# Patient Record
Sex: Female | Born: 1990 | Race: Black or African American | Hispanic: No | Marital: Married | Smoking: Never smoker
Health system: Southern US, Community
[De-identification: ages and names within clinical notes are randomized; demographics above are authoritative.]

## PROBLEM LIST (undated history)

## (undated) DIAGNOSIS — J45909 Unspecified asthma, uncomplicated: Secondary | ICD-10-CM

## (undated) DIAGNOSIS — L309 Dermatitis, unspecified: Secondary | ICD-10-CM

## (undated) DIAGNOSIS — T7840XA Allergy, unspecified, initial encounter: Secondary | ICD-10-CM

## (undated) DIAGNOSIS — T783XXA Angioneurotic edema, initial encounter: Secondary | ICD-10-CM

## (undated) DIAGNOSIS — M359 Systemic involvement of connective tissue, unspecified: Secondary | ICD-10-CM

## (undated) DIAGNOSIS — L509 Urticaria, unspecified: Secondary | ICD-10-CM

## (undated) HISTORY — DX: Unspecified asthma, uncomplicated: J45.909

## (undated) HISTORY — DX: Dermatitis, unspecified: L30.9

## (undated) HISTORY — DX: Allergy, unspecified, initial encounter: T78.40XA

## (undated) HISTORY — DX: Angioneurotic edema, initial encounter: T78.3XXA

## (undated) HISTORY — DX: Urticaria, unspecified: L50.9

---
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
## 2016-10-08 DIAGNOSIS — G4700 Insomnia, unspecified: Secondary | ICD-10-CM | POA: Insufficient documentation

## 2016-10-08 DIAGNOSIS — K5904 Chronic idiopathic constipation: Secondary | ICD-10-CM | POA: Insufficient documentation

## 2020-01-05 ENCOUNTER — Other Ambulatory Visit: Payer: Self-pay

## 2020-01-05 ENCOUNTER — Ambulatory Visit (INDEPENDENT_AMBULATORY_CARE_PROVIDER_SITE_OTHER): Admitting: Allergy and Immunology

## 2020-01-05 ENCOUNTER — Encounter: Payer: Self-pay | Admitting: Allergy and Immunology

## 2020-01-05 VITALS — BP 122/76 | HR 80 | Temp 98.1°F | Resp 16 | Ht 64.84 in | Wt 183.1 lb

## 2020-01-05 DIAGNOSIS — T783XXA Angioneurotic edema, initial encounter: Secondary | ICD-10-CM

## 2020-01-05 DIAGNOSIS — L500 Allergic urticaria: Secondary | ICD-10-CM | POA: Insufficient documentation

## 2020-01-05 DIAGNOSIS — T783XXD Angioneurotic edema, subsequent encounter: Secondary | ICD-10-CM

## 2020-01-05 DIAGNOSIS — J3089 Other allergic rhinitis: Secondary | ICD-10-CM | POA: Insufficient documentation

## 2020-01-05 DIAGNOSIS — K2970 Gastritis, unspecified, without bleeding: Secondary | ICD-10-CM

## 2020-01-05 DIAGNOSIS — J4520 Mild intermittent asthma, uncomplicated: Secondary | ICD-10-CM | POA: Diagnosis not present

## 2020-01-05 HISTORY — DX: Angioneurotic edema, initial encounter: T78.3XXA

## 2020-01-05 HISTORY — DX: Allergic urticaria: L50.0

## 2020-01-05 MED ORDER — AZELASTINE HCL 0.1 % NA SOLN: 1.0000 | Freq: Two times a day (BID) | NASAL | 5 refills | Status: DC | PRN

## 2020-01-05 MED ORDER — LEVOCETIRIZINE DIHYDROCHLORIDE 5 MG PO TABS
5.0000 mg | ORAL_TABLET | Freq: Every day | ORAL | 5 refills | Status: DC | PRN
Start: 2020-01-05 — End: 2021-05-04

## 2020-01-05 MED ORDER — EPINEPHRINE 0.3 MG/0.3ML IJ SOAJ: 0.3000 mg | INTRAMUSCULAR | 2 refills | Status: DC | PRN

## 2020-01-05 MED ORDER — FAMOTIDINE 20 MG PO TABS: 20.0000 mg | ORAL_TABLET | Freq: Two times a day (BID) | ORAL | 5 refills | Status: DC

## 2020-01-05 NOTE — Assessment & Plan Note (Signed)
   Continue to have access to albuterol HFA, 1 to 2 inhalations every 4-6 hours if needed. 

## 2020-01-05 NOTE — Progress Notes (Signed)
New Patient Note  RE: Shannon Huerta MRN: [id number] DOB: 1991-07-07 Date of Office Visit: 01/05/2020  Referring provider: Gerald Huerta Primary care provider: Andria Frames, PA-C  Chief Complaint: Urticaria and Asthma   History of present illness: Shannon Huerta is a 29 y.o. female seen today in consultation requested by Shannon Meo, PA-C.  Over the past 22 years, Shannon Huerta has experienced recurrent episodes of hives. However she had no hives over the past 2 years until she gave birth to her daughter on April 25th, 2021 and the hives have been particularly frequent and severe since that time.  The hives have appeared at different times over her entire body.  The lesions are described as erythematous, raised, and pruritic.  Individual hives last less than 24 hours without leaving residual pigmentation or bruising. She has experienced associated angioedema of the lips and/or eyelids but denies concomitant cardiopulmonary or GI symptoms. Occasionally she will get mild tingling sensation of the tongues or throat "but nothing too crazy."  She has not experienced unexpected weight loss or recurrent fevers.  The hives have seemed to get worse when she is around cats.  Otherwise, no specific medication, food, skin care product, detergent, soap, or other environmental triggers have been identified. The symptoms do not seem to correlate with NSAIDs. The hive are more frequent/severe with emotional stress.  Shannon Huerta has tried to control symptoms with systemic steroids and OTC antihistamines which have offered adequate temporary relief.  Over the past few weeks she has required 20 mg prednisone daily to control the hives. She has been evaluated and treated in the emergency department for these symptoms. Skin biopsy has not been performed. Over the past month she has been experiencing hives daily. She experiences nasal congestion, rhinorrhea, sneezing, nasal pruritus, and ocular pruritus.  The symptoms  occur most frequently with pollen exposure. Shannon Huerta had childhood asthma which seem to have resolved.  However, during her 2 pregnancies she did experience chest tightness, dyspnea, and wheezing which was relieved by albuterol.  She reports that she did not require an albuterol controller medication.  She denies lower respiratory symptoms since the delivery of her second child in April 2021.  Assessment and plan: Recurrent urticaria Unclear etiology. Skin tests to select food allergens were negative today. NSAIDs and emotional stress commonly exacerbate urticaria but are not the underlying etiology in this case. Physical urticarias are negative by history (i.e. pressure-induced, temperature, vibration, solar, etc.). History and lesions are not consistent with urticaria pigmentosa so I am not suspicious for mastocytosis.  Given the occasional associated mild oropharyngeal pruritus, we will prescribe an epinephrine autoinjector 2 pack. We will rule out other potential etiologies with labs. For symptom relief, patient is to take oral antihistamines as directed.  The following labs have been ordered: FCeRI antibody, anti-thyroglobulin antibody, thyroid peroxidase antibody, tryptase, H. pylori serology, CBC, CMP, ESR, ANA, and alpha-gal panel.  The patient will be called with further recommendations after lab results have returned.  Instructions have been discussed and provided for H1/H2 receptor blockade with titration to find lowest effective dose.  A prescription has been provided for levocetirizine (Xyzal), 5 mg daily as needed.  A prescription has been provided for famotidine (Pepcid), 51m twice daily as needed.  Should there be a significant increase or change in symptoms, a journal is to be kept recording any foods eaten, beverages consumed, medications taken within a 6 hour period prior to the onset of symptoms, as well as record activities  being performed, and environmental conditions. For any  symptoms concerning for anaphylaxis, epinephrine is to be administered and 911 is to be called immediately.  A prescription has been provided for epinephrine 0.3 mg autoinjector (AuviQ) 2 pack along with instructions for its proper administration.  Angioedema Associated angioedema occurs in up to 50% of patients with chronic urticaria.  Treatment/diagnostic plan as outlined above.  Allergic rhinitis  Aeroallergen avoidance measures have been discussed and provided in written form.  Levocetirizine has been prescribed (as above).  A prescription has been provided for azelastine nasal spray, 1-2 sprays per nostril 2 times daily if needed. Proper nasal spray technique has been discussed and demonstrated.   Nasal saline spray (i.e., Simply Saline) or nasal saline lavage (i.e., NeilMed) is recommended as needed and prior to medicated nasal sprays.  Mild intermittent asthma  Continue to have access to albuterol HFA, 1 to 2 inhalations every 4-6 hours if needed.   Meds ordered this encounter  Medications  . levocetirizine (XYZAL) 5 MG tablet    Sig: Take 1 tablet (5 mg total) by mouth daily as needed for allergies.    Dispense:  30 tablet    Refill:  5  . famotidine (PEPCID) 20 MG tablet    Sig: Take 1 tablet (20 mg total) by mouth 2 (two) times daily.    Dispense:  60 tablet    Refill:  5  . EPINEPHrine (AUVI-Q) 0.3 mg/0.3 mL IJ SOAJ injection    Sig: Inject 0.3 mLs (0.3 mg total) into the muscle as needed for anaphylaxis.    Dispense:  2 each    Refill:  2    [phone] (M)  . azelastine (ASTELIN) 0.1 % nasal spray    Sig: Place 1-2 sprays into both nostrils 2 (two) times daily as needed for rhinitis.    Dispense:  30 mL    Refill:  5    Diagnostics: Spirometry: Normal with an FEV1 of 99% predicted. This study was performed while the patient was asymptomatic.  Please see scanned spirometry results for details. Environmental skin testing: Positive to grass pollen, mold, and  dust mite antigen.   Food allergen skin testing: Negative despite a positive histamine control.    Physical examination: Blood pressure 122/76, pulse 80, temperature 98.1 F (36.7 C), temperature source Oral, resp. rate 16, height 5' 4.84" (1.647 m), weight 183 lb 1.6 oz (83.1 kg), SpO2 100 %.  General: Alert, interactive, in no acute distress. HEENT: TMs pearly gray, turbinates moderately edematous without discharge, post-pharynx moderately erythematous. Neck: Supple without lymphadenopathy. Lungs: Clear to auscultation without wheezing, rhonchi or rales. CV: Normal S1, S2 without murmurs. Abdomen: Nondistended, nontender. Skin: Warm and dry, without lesions or rashes. Extremities:  No clubbing, cyanosis or edema. Neuro:   Grossly intact.  Review of systems:  Review of systems negative except as noted in HPI / PMHx or noted below: Review of Systems  Constitutional: Negative.   HENT: Negative.   Eyes: Negative.   Respiratory: Negative.   Cardiovascular: Negative.   Gastrointestinal: Negative.   Genitourinary: Negative.   Musculoskeletal: Negative.   Skin: Negative.   Neurological: Negative.   Endo/Heme/Allergies: Negative.   Psychiatric/Behavioral: Negative.     Past medical history:  Past Medical History:  Diagnosis Date  . Angio-edema   . Anxiety 2012  . Asthma   . Urticaria     Past surgical history:  History reviewed. No pertinent surgical history.  Family history: Family History  Problem Relation Age of Onset  .   Lupus Mother   . Asthma Mother   . Urticaria Father   . Lupus Maternal Uncle     Social history: Social History   Socioeconomic History  . Marital status: Married    Spouse name: Not on file  . Number of children: Not on file  . Years of education: Not on file  . Highest education level: Not on file  Occupational History  . Not on file  Tobacco Use  . Smoking status: Never Smoker  . Smokeless tobacco: Never Used  Substance and Sexual  Activity  . Alcohol use: Not on file  . Drug use: Not on file  . Sexual activity: Not on file  Other Topics Concern  . Not on file  Social History Narrative  . Not on file   Social Determinants of Health   Financial Resource Strain:   . Difficulty of Paying Living Expenses:   Food Insecurity:   . Worried About Running Out of Food in the Last Year:   . Ran Out of Food in the Last Year:   Transportation Needs:   . Lack of Transportation (Medical):   . Lack of Transportation (Non-Medical):   Physical Activity:   . Days of Exercise per Week:   . Minutes of Exercise per Session:   Stress:   . Feeling of Stress :   Social Connections:   . Frequency of Communication with Friends and Family:   . Frequency of Social Gatherings with Friends and Family:   . Attends Religious Services:   . Active Member of Clubs or Organizations:   . Attends Club or Organization Meetings:   . Marital Status:   Intimate Partner Violence:   . Fear of Current or Ex-Partner:   . Emotionally Abused:   . Physically Abused:   . Sexually Abused:     Environmental History: The patient lives in a house with carpeting in the bedroom, gas heat, and central air.  There is no known mold/water damage in the home.  There is a dog in the home which has access to her bedroom.  She is a non-smoker.  Current Outpatient Medications  Medication Sig Dispense Refill  . acetaminophen (TYLENOL) 500 MG tablet Take 500 mg by mouth every 6 (six) hours as needed.    . cetirizine (ZYRTEC) 10 MG tablet Take 10 mg by mouth daily.    . hydrOXYzine (ATARAX/VISTARIL) 25 MG tablet Take by mouth.    . methylPREDNISolone (MEDROL DOSEPAK) 4 MG TBPK tablet See admin instructions. follow package directions    . Multiple Vitamins-Minerals (MULTIVITAMIN WITH MINERALS) tablet Take by mouth.    . predniSONE (DELTASONE) 20 MG tablet Take 20 mg by mouth daily.    . azelastine (ASTELIN) 0.1 % nasal spray Place 1-2 sprays into both nostrils 2  (two) times daily as needed for rhinitis. 30 mL 5  . EPINEPHrine (AUVI-Q) 0.3 mg/0.3 mL IJ SOAJ injection Inject 0.3 mLs (0.3 mg total) into the muscle as needed for anaphylaxis. 2 each 2  . famotidine (PEPCID) 20 MG tablet Take 1 tablet (20 mg total) by mouth 2 (two) times daily. 60 tablet 5  . levocetirizine (XYZAL) 5 MG tablet Take 1 tablet (5 mg total) by mouth daily as needed for allergies. 30 tablet 5   No current facility-administered medications for this visit.    Known medication allergies: No Known Allergies  I appreciate the opportunity to take part in Kinisha's care. Please do not hesitate to contact me with questions.  Sincerely,     R. Carter Bobbitt, MD 

## 2020-01-05 NOTE — Assessment & Plan Note (Signed)
Associated angioedema occurs in up to 50% of patients with chronic urticaria.  Treatment/diagnostic plan as outlined above. 

## 2020-01-05 NOTE — Assessment & Plan Note (Signed)
Unclear etiology. Skin tests to select food allergens were negative today. NSAIDs and emotional stress commonly exacerbate urticaria but are not the underlying etiology in this case. Physical urticarias are negative by history (i.e. pressure-induced, temperature, vibration, solar, etc.). History and lesions are not consistent with urticaria pigmentosa so I am not suspicious for mastocytosis.  Given the occasional associated mild oropharyngeal pruritus, we will prescribe an epinephrine autoinjector 2 pack. We will rule out other potential etiologies with labs. For symptom relief, patient is to take oral antihistamines as directed.  The following labs have been ordered: FCeRI antibody, anti-thyroglobulin antibody, thyroid peroxidase antibody, tryptase, H. pylori serology, CBC, CMP, ESR, ANA, and alpha-gal panel.  The patient will be called with further recommendations after lab results have returned.  Instructions have been discussed and provided for H1/H2 receptor blockade with titration to find lowest effective dose.  A prescription has been provided for levocetirizine (Xyzal), 5 mg daily as needed.  A prescription has been provided for famotidine (Pepcid), 20mg twice daily as needed.  Should there be a significant increase or change in symptoms, a journal is to be kept recording any foods eaten, beverages consumed, medications taken within a 6 hour period prior to the onset of symptoms, as well as record activities being performed, and environmental conditions. For any symptoms concerning for anaphylaxis, epinephrine is to be administered and 911 is to be called immediately.  A prescription has been provided for epinephrine 0.3 mg autoinjector (AuviQ) 2 pack along with instructions for its proper administration. 

## 2020-01-05 NOTE — Assessment & Plan Note (Addendum)
   Aeroallergen avoidance measures have been discussed and provided in written form.  Levocetirizine has been prescribed (as above).  A prescription has been provided for azelastine nasal spray, 1-2 sprays per nostril 2 times daily if needed. Proper nasal spray technique has been discussed and demonstrated.   Nasal saline spray (i.e., Simply Saline) or nasal saline lavage (i.e., NeilMed) is recommended as needed and prior to medicated nasal sprays.

## 2020-01-05 NOTE — Patient Instructions (Signed)
Recurrent urticaria Unclear etiology. Skin tests to select food allergens were negative today. NSAIDs and emotional stress commonly exacerbate urticaria but are not the underlying etiology in this case. Physical urticarias are negative by history (i.e. pressure-induced, temperature, vibration, solar, etc.). History and lesions are not consistent with urticaria pigmentosa so I am not suspicious for mastocytosis.  Given the occasional associated mild oropharyngeal pruritus, we will prescribe an epinephrine autoinjector 2 pack. We will rule out other potential etiologies with labs. For symptom relief, patient is to take oral antihistamines as directed.  The following labs have been ordered: FCeRI antibody, anti-thyroglobulin antibody, thyroid peroxidase antibody, tryptase, H. pylori serology, CBC, CMP, ESR, ANA, and alpha-gal panel.  The patient will be called with further recommendations after lab results have returned.  Instructions have been discussed and provided for H1/H2 receptor blockade with titration to find lowest effective dose.  A prescription has been provided for levocetirizine (Xyzal), 5 mg daily as needed.  A prescription has been provided for famotidine (Pepcid), 27m twice daily as needed.  Should there be a significant increase or change in symptoms, a journal is to be kept recording any foods eaten, beverages consumed, medications taken within a 6 hour period prior to the onset of symptoms, as well as record activities being performed, and environmental conditions. For any symptoms concerning for anaphylaxis, epinephrine is to be administered and 911 is to be called immediately.  A prescription has been provided for epinephrine 0.3 mg autoinjector (AuviQ) 2 pack along with instructions for its proper administration.  Angioedema Associated angioedema occurs in up to 50% of patients with chronic urticaria.  Treatment/diagnostic plan as outlined above.  Allergic  rhinitis  Aeroallergen avoidance measures have been discussed and provided in written form.  Levocetirizine has been prescribed (as above).  A prescription has been provided for azelastine nasal spray, 1-2 sprays per nostril 2 times daily if needed. Proper nasal spray technique has been discussed and demonstrated.   Nasal saline spray (i.e., Simply Saline) or nasal saline lavage (i.e., NeilMed) is recommended as needed and prior to medicated nasal sprays.  Mild intermittent asthma  Continue to have access to albuterol HFA, 1 to 2 inhalations every 4-6 hours if needed.   When lab results have returned you will be called with further recommendations. With the newly implemented Cures Act, the labs may be visible to you at the same time they become visible to [place] However, the results will typically not be addressed until all of the results are back, so please be patient.  Until you have heard from [place] please continue the treatment plan as outlined on your take home sheet.  Urticaria (Hives)  . Levocetirizine (Xyzal) 5 mg twice a day and famotidine (Pepcid) 20 mg twice a day. If no symptoms for 7-14 days then decrease to. . Levocetirizine (Xyzal) 5 mg twice a day and famotidine (Pepcid) 20 mg once a day.  If no symptoms for 7-14 days then decrease to. . Levocetirizine (Xyzal) 5 mg twice a day.  If no symptoms for 7-14 days then decrease to. . Levocetirizine (Xyzal) 5 mg once a day.  May use Benadryl (diphenhydramine) as needed for breakthrough symptoms       If symptoms return, then step up dosage  Control of [place] mites play a major role in allergic asthma and rhinitis.  They occur in environments with high humidity wherever human skin, the food for dust mites is found. High levels have been detected  in dust obtained from mattresses, pillows, carpets, upholstered furniture, bed covers, clothes and soft toys.  The principal allergen of the house dust mite is  found in its feces.  A gram of dust may contain 1,000 mites and 250,000 fecal particles.  Mite antigen is easily measured in the air during house cleaning activities.    1. Encase mattresses, including the box spring, and pillow, in an air tight cover.  Seal the zipper end of the encased mattresses with wide adhesive tape. 2. Wash the bedding in water of 130 degrees Farenheit weekly.  Avoid cotton comforters/quilts and flannel bedding: the most ideal bed covering is the dacron comforter. 3. Remove all upholstered furniture from the bedroom. 4. Remove carpets, carpet padding, rugs, and non-washable window drapes from the bedroom.  Wash drapes weekly or use plastic window coverings. 5. Remove all non-washable stuffed toys from the bedroom.  Wash stuffed toys weekly. 6. Have the room cleaned frequently with a vacuum cleaner and a damp dust-mop.  The patient should not be in a room which is being cleaned and should wait 1 hour after cleaning before going into the room. 7. Close and seal all heating outlets in the bedroom.  Otherwise, the room will become filled with dust-laden air.  An electric heater can be used to heat the room. 8. Reduce indoor humidity to less than 50%.  Do not use a humidifier.  Reducing Pollen Exposure  The American Academy of Allergy, Asthma and Immunology suggests the following steps to reduce your exposure to pollen during allergy seasons.    1. Do not hang sheets or clothing out to dry; pollen may collect on these items. 2. Do not mow lawns or spend time around freshly cut grass; mowing stirs up pollen. 3. Keep windows closed at night.  Keep car windows closed while driving. 4. Minimize morning activities outdoors, a time when pollen counts are usually at their highest. 5. Stay indoors as much as possible when pollen counts or humidity is high and on windy days when pollen tends to remain in the air longer. 6. Use air conditioning when possible.  Many air conditioners have  filters that trap the pollen spores. 7. Use a HEPA room air filter to remove pollen form the indoor air you breathe.   Control of Mold Allergen  Mold and fungi can grow on a variety of surfaces provided certain temperature and moisture conditions exist.  Outdoor molds grow on plants, decaying vegetation and soil.  The major outdoor mold, Alternaria and Cladosporium, are found in very high numbers during hot and dry conditions.  Generally, a late Summer - Fall peak is seen for common outdoor fungal spores.  Rain will temporarily lower outdoor mold spore count, but counts rise rapidly when the rainy period ends.  The most important indoor molds are Aspergillus and Penicillium.  Dark, humid and poorly ventilated basements are ideal sites for mold growth.  The next most common sites of mold growth are the bathroom and the kitchen.  Outdoor [employer] 1. Use air conditioning and keep windows closed 2. Avoid exposure to decaying vegetation. 3. Avoid leaf raking. 4. Avoid grain handling. 5. Consider wearing a face mask if working in moldy areas.  Indoor Mold Control 1. Maintain humidity below 50%. 2. Clean washable surfaces with 5% bleach solution. 3. Remove sources e.g. Contaminated carpets.

## 2020-01-20 LAB — CBC WITH DIFFERENTIAL/PLATELET
Basophils Absolute: 0 10*3/uL (ref 0.0–0.2)
Basos: 0 %
EOS (ABSOLUTE): 0.1 10*3/uL (ref 0.0–0.4)
Eos: 1 %
Hematocrit: 44.4 % (ref 34.0–46.6)
Hemoglobin: 14.9 g/dL (ref 11.1–15.9)
Immature Grans (Abs): 0 10*3/uL (ref 0.0–0.1)
Immature Granulocytes: 0 %
Lymphocytes Absolute: 1.9 10*3/uL (ref 0.7–3.1)
Lymphs: 26 %
MCH: 25.8 pg — ABNORMAL LOW (ref 26.6–33.0)
MCHC: 33.6 g/dL (ref 31.5–35.7)
MCV: 77 fL — ABNORMAL LOW (ref 79–97)
Monocytes Absolute: 0.4 10*3/uL (ref 0.1–0.9)
Monocytes: 5 %
Neutrophils Absolute: 4.9 10*3/uL (ref 1.4–7.0)
Neutrophils: 68 %
Platelets: 243 10*3/uL (ref 150–450)
RBC: 5.77 x10E6/uL — ABNORMAL HIGH (ref 3.77–5.28)
RDW: 12.8 % (ref 11.7–15.4)
WBC: 7.3 10*3/uL (ref 3.4–10.8)

## 2020-01-20 LAB — ALPHA-GAL PANEL
Alpha Gal IgE*: <0.1 kU/L (ref <0.10)
Beef (Bos spp) IgE: <0.1 kU/L (ref <0.35)
Class Interpretation: 0
Class Interpretation: 0
Class Interpretation: 0
Lamb/Mutton (Ovis spp) IgE: <0.1 kU/L (ref <0.35)
Pork (Sus spp) IgE: <0.1 kU/L (ref <0.35)

## 2020-01-20 LAB — COMPREHENSIVE METABOLIC PANEL
ALT: 24 IU/L (ref 0–32)
AST: 17 IU/L (ref 0–40)
Albumin/Globulin Ratio: 1.6 (ref 1.2–2.2)
Albumin: 4.4 g/dL (ref 3.9–5.0)
Alkaline Phosphatase: 110 IU/L (ref 48–121)
BUN/Creatinine Ratio: 14 (ref 9–23)
BUN: 11 mg/dL (ref 6–20)
Bilirubin Total: 0.2 mg/dL (ref 0.0–1.2)
CO2: 23 mmol/L (ref 20–29)
Calcium: 9.6 mg/dL (ref 8.7–10.2)
Chloride: 102 mmol/L (ref 96–106)
Creatinine, Ser: 0.79 mg/dL (ref 0.57–1.00)
GFR calc Af Amer: 119 mL/min/{1.73_m2} (ref >59)
GFR calc non Af Amer: 103 mL/min/{1.73_m2} (ref >59)
Globulin, Total: 2.8 g/dL (ref 1.5–4.5)
Glucose: 77 mg/dL (ref 65–99)
Potassium: 4 mmol/L (ref 3.5–5.2)
Sodium: 140 mmol/L (ref 134–144)
Total Protein: 7.2 g/dL (ref 6.0–8.5)

## 2020-01-20 LAB — THYROID ANTIBODIES
Thyroglobulin Antibody: <1 IU/mL (ref 0.0–0.9)
Thyroperoxidase Ab SerPl-aCnc: <9 IU/mL (ref 0–34)

## 2020-01-20 LAB — CHRONIC URTICARIA: cu index: 35 — ABNORMAL HIGH (ref <10)

## 2020-01-20 LAB — SEDIMENTATION RATE: Sed Rate: 10 mm/hr (ref 0–32)

## 2020-01-20 LAB — TRYPTASE: Tryptase: 10.1 ug/L (ref 2.2–13.2)

## 2020-01-20 LAB — ANA: Anti Nuclear Antibody (ANA): POSITIVE — AB

## 2020-01-22 ENCOUNTER — Other Ambulatory Visit: Payer: Self-pay | Admitting: *Deleted

## 2020-01-22 MED ORDER — MONTELUKAST SODIUM 10 MG PO TABS
10.0000 mg | ORAL_TABLET | Freq: Every day | ORAL | 5 refills | Status: DC
Start: 2020-01-22 — End: 2021-10-05

## 2020-02-12 ENCOUNTER — Telehealth: Payer: Self-pay | Admitting: Allergy and Immunology

## 2020-02-12 MED ORDER — PREDNISONE 10 MG PO TABS: 10.0000 mg | ORAL_TABLET | Freq: Every day | ORAL | 0 refills | Status: DC

## 2020-02-12 MED ORDER — CETIRIZINE HCL 10 MG PO TABS: 10.0000 mg | ORAL_TABLET | Freq: Two times a day (BID) | ORAL | 2 refills | Status: DC

## 2020-02-12 NOTE — Telephone Encounter (Signed)
I reviewed her notes.  I tried to send in xyzal but doesn't look like it's on her formulary.  Is she taking pepcid 20mg  BID and singulair 10mg  at night?  Did she try cetirizine in the past? I will send in cetirizine 10mg  tablets - take 1 tablet twice a day.  Prednisone I will send in only 5 tablets.  If she's still having issues with her hives not being controlled, recommend that she makes a follow up visit with a doctor to discuss next steps.  Thank you.

## 2020-02-12 NOTE — Telephone Encounter (Signed)
We last refilled xyzal on 01/05/20 with 30 tabs and 5 refills- insurance will usually only pay for 30 tabs a month. Pt will need to buy the extra OTC. I will call to let the pt know of this.   Dr. Nunzio Cobbs is out of office today. Please advise if pt can get prednisone due to her hives.

## 2020-02-12 NOTE — Telephone Encounter (Signed)
Spoke with pt- she saw a rheumatoid doctor and she was put on Plaquenil 200 mg and was scared to take the Pepcid BID as it might interact? So she is taking Pepcid once a night. She is not taking montelukast because she is scared that it might cause mood swings and throw her into post partum depression because she has a 1 month old.  Her insurance did cover the xyzal, told her to purchase the extra OTC- she takes it BID. I told her to schedule a follow up with Dr. Nunzio Cobbs.

## 2020-02-12 NOTE — Telephone Encounter (Signed)
Patient called and needs a refill on Xyzal. Patient states the pharmacy would not refill it because Dr. Nunzio Cobbs gave her a 90 day supply, but she was told that if she needed to take more during the day she could.   Patient also would like a prescription for prednisone as she still has hives everywhere and has not had any relief.   Please advise.

## 2020-02-15 NOTE — Telephone Encounter (Signed)
I agree with stopping montelukast if there is a concern for postpartum depression. Where did she see that there may be a drug interaction between Plaquenil and Pepcid?

## 2020-02-15 NOTE — Telephone Encounter (Signed)
Spoke with pt and she read it on web MD, although she said the pepcid BID and Xyzal BID was not helping with the hives. She is having hives everyday. Per Dr. Nunzio Cobbs she will come in for a sample of Xolair 300mg  to see if it helps with her hives. I scheduled her for tomorrow morning.

## 2020-02-16 ENCOUNTER — Ambulatory Visit (INDEPENDENT_AMBULATORY_CARE_PROVIDER_SITE_OTHER)

## 2020-02-16 ENCOUNTER — Other Ambulatory Visit: Payer: Self-pay

## 2020-02-16 DIAGNOSIS — L500 Allergic urticaria: Secondary | ICD-10-CM | POA: Diagnosis not present

## 2020-02-16 MED ORDER — OMALIZUMAB 150 MG ~~LOC~~ SOLR
300.0000 mg | SUBCUTANEOUS | Status: DC
  Administered 2020-02-16 – 2022-05-14 (×28): 300 mg via SUBCUTANEOUS

## 2020-02-16 NOTE — Progress Notes (Signed)
Immunotherapy   Patient Details  Name: Shannon Huerta MRN: [id number] Date of Birth: Nov 24, 1990  02/16/2020  Cleon Gustin received a sample of Xolair 300mg  for hives per Dr. .  Epi-Pen: AuviQ Rx sent into pharmacy Consent signed and patient instructions given.   Nunzio Cobbs 02/16/2020, 11:09 AM

## 2020-02-19 ENCOUNTER — Telehealth: Payer: Self-pay | Admitting: Allergy and Immunology

## 2020-02-19 NOTE — Telephone Encounter (Signed)
Patient states no hives since beginning xolair and wanted to discuss if insurance will cover her shots.

## 2020-02-22 MED ORDER — XOLAIR 150 MG ~~LOC~~ SOLR
300.0000 mg | SUBCUTANEOUS | 11 refills | Status: DC
Start: 2020-02-22 — End: 2022-01-24

## 2020-02-22 NOTE — Telephone Encounter (Signed)
Called and discussed with patient ERX to [facility] for Xolair which is covered under her [employer] medical plan.  They will reach out with her copay amount and once shipped will advise her to make appt for 4 weeks from last injection

## 2020-02-22 NOTE — Telephone Encounter (Signed)
Spoke to pt. To let her know her xolair question regarding insurance coverage was routed to [employer]. Let pt.  Know Kaijah was out of the office last week so it may take some time to get back with her. I did give pt. Millie's number if she hasn't heard from this morning to call her this afternoon.

## 2020-03-02 ENCOUNTER — Ambulatory Visit
Admission: RE | Admit: 2020-03-02 | Discharge: 2020-03-02 | Disposition: A | Discharge status: Home / Self Care | Payer: Self-pay | Source: Ambulatory Visit | Attending: Emergency Medicine | Admitting: Emergency Medicine

## 2020-03-02 ENCOUNTER — Other Ambulatory Visit: Payer: Self-pay

## 2020-03-02 VITALS — BP 121/88 | HR 87 | Temp 98.2°F | Resp 17

## 2020-03-02 DIAGNOSIS — R109 Unspecified abdominal pain: Secondary | ICD-10-CM

## 2020-03-02 DIAGNOSIS — R110 Nausea: Secondary | ICD-10-CM

## 2020-03-02 MED ORDER — ALUM & MAG HYDROXIDE-SIMETH 200-200-20 MG/5ML PO SUSP
30.0000 mL | Freq: Once | ORAL | Status: AC
  Administered 2020-03-02: 30 mL via ORAL

## 2020-03-02 MED ORDER — DICYCLOMINE HCL 20 MG PO TABS
20.0000 mg | ORAL_TABLET | Freq: Two times a day (BID) | ORAL | 0 refills | Status: DC
Start: 2020-03-02 — End: 2021-10-05

## 2020-03-02 MED ORDER — ONDANSETRON HCL 4 MG PO TABS
4.0000 mg | ORAL_TABLET | Freq: Four times a day (QID) | ORAL | 0 refills | Status: DC
Start: 2020-03-02 — End: 2022-03-23

## 2020-03-02 NOTE — ED Provider Notes (Signed)
[facility]   [id number] 03/02/20 Arrival Time: 1814  CC: ABDOMINAL DISCOMFORT  SUBJECTIVE:  CLEMENCE Huerta is a 29 y.o. female who presents with complaint of abdominal discomfort and nausea that began 1 day ago.  Denies a precipitating event, trauma, close contacts with similar symptoms.  Localizes pain to epigastric region.   Describes as intermittent and burning in character.  Has tried OTC medications without relief.  Denies alleviating or aggravating factors.  Denies similar symptoms in the past.   Denies fever, chills, vomiting, chest pain, SOB, diarrhea, constipation, hematochezia, melena, dysuria, difficulty urinating, increased frequency or urgency, flank pain, loss of bowel or bladder function, vaginal discharge, vaginal odor, vaginal bleeding, dyspareunia, pelvic pain.     LMP: 03/01/20  ROS: As per HPI.  All other pertinent ROS negative.     Past Medical History:  Diagnosis Date  . Angio-edema   . Anxiety 2012  . Asthma   . Urticaria    History reviewed. No pertinent surgical history. No Known Allergies Current Facility-Administered Medications on File Prior to Encounter  Medication Dose Route Frequency Provider Last Rate Last Admin  . omalizumab Geoffry Paradise) injection 300 mg  300 mg Subcutaneous Q28 days Bobbitt, Heywood Iles, MD   300 mg at 02/16/20 8413   Current Outpatient Medications on File Prior to Encounter  Medication Sig Dispense Refill  . acetaminophen (TYLENOL) 500 MG tablet Take 500 mg by mouth every 6 (six) hours as needed.    . cetirizine (ZYRTEC) 10 MG tablet Take 1 tablet (10 mg total) by mouth in the morning and at bedtime. 60 tablet 2  . EPINEPHrine (AUVI-Q) 0.3 mg/0.3 mL IJ SOAJ injection Inject 0.3 mLs (0.3 mg total) into the muscle as needed for anaphylaxis. 2 each 2  . famotidine (PEPCID) 20 MG tablet Take 1 tablet (20 mg total) by mouth 2 (two) times daily. 60 tablet 5  . hydrOXYzine (ATARAX/VISTARIL) 25 MG tablet Take by mouth.    .  levocetirizine (XYZAL) 5 MG tablet Take 1 tablet (5 mg total) by mouth daily as needed for allergies. 30 tablet 5  . methylPREDNISolone (MEDROL DOSEPAK) 4 MG TBPK tablet See admin instructions. follow package directions    . montelukast (SINGULAIR) 10 MG tablet Take 1 tablet (10 mg total) by mouth at bedtime. 30 tablet 5  . Multiple Vitamins-Minerals (MULTIVITAMIN WITH MINERALS) tablet Take by mouth.    Marland Kitchen omalizumab (XOLAIR) 150 MG injection Inject 300 mg into the skin every 28 (twenty-eight) days. 6 each 11  . predniSONE (DELTASONE) 10 MG tablet Take 1 tablet (10 mg total) by mouth daily with breakfast. As needed for hives 6 tablet 0  . [DISCONTINUED] azelastine (ASTELIN) 0.1 % nasal spray Place 1-2 sprays into both nostrils 2 (two) times daily as needed for rhinitis. 30 mL 5   Social History   Socioeconomic History  . Marital status: Married    Spouse name: Not on file  . Number of children: Not on file  . Years of education: Not on file  . Highest education level: Not on file  Occupational History  . Not on file  Tobacco Use  . Smoking status: Never Smoker  . Smokeless tobacco: Never Used  Vaping Use  . Vaping Use: Never used  Substance and Sexual Activity  . Alcohol use: Not on file  . Drug use: Not on file  . Sexual activity: Not on file  Other Topics Concern  . Not on file  Social History Narrative  .  Not on file   Social Determinants of Health   Financial Resource Strain:   . Difficulty of Paying Living Expenses:   Food Insecurity:   . Worried About Programme researcher, broadcasting/film/video in the Last Year:   . Barista in the Last Year:   Transportation Needs:   . Freight forwarder (Medical):   Marland Kitchen Lack of Transportation (Non-Medical):   Physical Activity:   . Days of Exercise per Week:   . Minutes of Exercise per Session:   Stress:   . Feeling of Stress :   Social Connections:   . Frequency of Communication with Friends and Family:   . Frequency of Social Gatherings  with Friends and Family:   . Attends Religious Services:   . Active Member of Clubs or Organizations:   . Attends Banker Meetings:   Marland Kitchen Marital Status:   Intimate Partner Violence:   . Fear of Current or Ex-Partner:   . Emotionally Abused:   Marland Kitchen Physically Abused:   . Sexually Abused:    Family History  Problem Relation Age of Onset  . Lupus Mother   . Asthma Mother   . Urticaria Father   . Lupus Maternal Uncle      OBJECTIVE:   121/88 mmHg 87 bpm 97% SpO2 98.2 F  General appearance: Alert; NAD HEENT: NCAT.  Oropharynx clear.  Lungs: clear to auscultation bilaterally without adventitious breath sounds Heart: regular rate and rhythm.   Abdomen: soft, non-distended; normal active bowel sounds; mild diffuse TTP over epigastric region; nontender at McBurney's point; negative Murphy's sign;  no guarding Extremities: no edema; symmetrical with no gross deformities Skin: warm and dry Neurologic: normal gait Psychological: alert and cooperative; normal mood and affect  ASSESSMENT & PLAN:  1. Abdominal discomfort   2. Nausea without vomiting     Meds ordered this encounter  Medications  . ondansetron (ZOFRAN) 4 MG tablet    Sig: Take 1 tablet (4 mg total) by mouth every 6 (six) hours.    Dispense:  12 tablet    Refill:  0    Order Specific Question:   Supervising Provider    Answer:   Eustace Moore [[id number]]  . dicyclomine (BENTYL) 20 MG tablet    Sig: Take 1 tablet (20 mg total) by mouth 2 (two) times daily.    Dispense:  20 tablet    Refill:  0    Order Specific Question:   Supervising Provider    Answer:   Eustace Moore [[id number]]   Unable to rule out pancreatitis or gallbladder disease in urgent care setting.  Offered patient further evaluation and management in the ED.  Patient declines at this time and would like to try outpatient therapy first.  Aware of the risk associated with this decision including missed diagnosis, organ damage, organ  failure, and/or death.  Patient aware and in agreement.     Get rest and drink fluids Zofran prescribed.  Take as directed.   Bentyl for abdominal discomfort If you experience new or worsening symptoms go to ER such as fever, chills, nausea, vomiting, diarrhea, bloody or dark tarry stools, constipation, urinary symptoms, worsening abdominal discomfort, symptoms that do not improve with medications, inability to keep fluids down, etc...  Reviewed expectations re: course of current medical issues. Questions answered. Outlined signs and symptoms indicating need for more acute intervention. Patient verbalized understanding. After Visit Summary given.   Rennis Harding, PA-C 03/02/20 1904

## 2020-03-02 NOTE — Discharge Instructions (Signed)
Unable to rule out pancreatitis or gallbladder disease in urgent care setting.  Offered patient further evaluation and management in the ED.  Patient declines at this time and would like to try outpatient therapy first.  Aware of the risk associated with this decision including missed diagnosis, organ damage, organ failure, and/or death.  Patient aware and in agreement.     Get rest and drink fluids Zofran prescribed.  Take as directed.   Bentyl for abdominal discomfort If you experience new or worsening symptoms go to ER such as fever, chills, nausea, vomiting, diarrhea, bloody or dark tarry stools, constipation, urinary symptoms, worsening abdominal discomfort, symptoms that do not improve with medications, inability to keep fluids down, etc..Marland Kitchen

## 2020-03-02 NOTE — ED Triage Notes (Signed)
Triaged by provider  

## 2020-03-07 ENCOUNTER — Emergency Department (HOSPITAL_COMMUNITY): Admission: EM | Admit: 2020-03-07 | Discharge: 2020-03-07 | Discharge status: Against Medical Advice

## 2020-03-07 ENCOUNTER — Other Ambulatory Visit: Payer: Self-pay

## 2020-03-07 DIAGNOSIS — R10811 Right upper quadrant abdominal tenderness: Secondary | ICD-10-CM | POA: Diagnosis not present

## 2020-03-07 DIAGNOSIS — R1033 Periumbilical pain: Secondary | ICD-10-CM | POA: Diagnosis not present

## 2020-03-07 DIAGNOSIS — R110 Nausea: Secondary | ICD-10-CM | POA: Diagnosis not present

## 2020-03-07 DIAGNOSIS — R109 Unspecified abdominal pain: Secondary | ICD-10-CM | POA: Diagnosis present

## 2020-03-07 DIAGNOSIS — J45909 Unspecified asthma, uncomplicated: Secondary | ICD-10-CM | POA: Diagnosis not present

## 2020-03-07 LAB — COMPREHENSIVE METABOLIC PANEL
ALT: 29 U/L (ref 0–44)
AST: 16 U/L (ref 15–41)
Albumin: 4.3 g/dL (ref 3.5–5.0)
Alkaline Phosphatase: 69 U/L (ref 38–126)
Anion gap: 9 (ref 5–15)
BUN: 10 mg/dL (ref 6–20)
CO2: 27 mmol/L (ref 22–32)
Calcium: 8.9 mg/dL (ref 8.9–10.3)
Chloride: 103 mmol/L (ref 98–111)
Creatinine, Ser: 0.88 mg/dL (ref 0.44–1.00)
GFR calc Af Amer: >60 mL/min (ref >60)
GFR calc non Af Amer: >60 mL/min (ref >60)
Glucose, Bld: 97 mg/dL (ref 70–99)
Potassium: 3.5 mmol/L (ref 3.5–5.1)
Sodium: 139 mmol/L (ref 135–145)
Total Bilirubin: 0.6 mg/dL (ref 0.3–1.2)
Total Protein: 7.4 g/dL (ref 6.5–8.1)

## 2020-03-07 LAB — I-STAT BETA HCG BLOOD, ED (MC, WL, AP ONLY): I-stat hCG, quantitative: <5 m[IU]/mL (ref <5)

## 2020-03-07 LAB — LIPASE, BLOOD: Lipase: 29 U/L (ref 11–51)

## 2020-03-07 LAB — CBC
HCT: 41.7 % (ref 36.0–46.0)
Hemoglobin: 13.4 g/dL (ref 12.0–15.0)
MCH: 25.9 pg — ABNORMAL LOW (ref 26.0–34.0)
MCHC: 32.1 g/dL (ref 30.0–36.0)
MCV: 80.5 fL (ref 80.0–100.0)
Platelets: 297 10*3/uL (ref 150–400)
RBC: 5.18 MIL/uL — ABNORMAL HIGH (ref 3.87–5.11)
RDW: 14 % (ref 11.5–15.5)
WBC: 9.6 10*3/uL (ref 4.0–10.5)
nRBC: 0 % (ref 0.0–0.2)

## 2020-03-07 MED ORDER — SODIUM CHLORIDE 0.9% FLUSH: 3.0000 mL | Freq: Once | INTRAVENOUS | Status: DC

## 2020-03-07 NOTE — ED Triage Notes (Signed)
Arrived POV from home. Patient reports abdominal pain since last Wednesday. Patient states pain is much worse today. Patient also reports mucus in stool. Abdomen is distended but soft

## 2020-03-08 ENCOUNTER — Emergency Department (HOSPITAL_COMMUNITY)
Admission: EM | Admit: 2020-03-08 | Discharge: 2020-03-08 | Disposition: A | Discharge status: Home / Self Care | Attending: Emergency Medicine | Admitting: Emergency Medicine

## 2020-03-08 ENCOUNTER — Telehealth: Payer: Self-pay | Admitting: Allergy and Immunology

## 2020-03-08 ENCOUNTER — Encounter (HOSPITAL_COMMUNITY): Payer: Self-pay

## 2020-03-08 ENCOUNTER — Emergency Department (HOSPITAL_COMMUNITY)

## 2020-03-08 DIAGNOSIS — R1033 Periumbilical pain: Secondary | ICD-10-CM

## 2020-03-08 LAB — WET PREP, GENITAL
Clue Cells Wet Prep HPF POC: NONE SEEN
Sperm: NONE SEEN
Trich, Wet Prep: NONE SEEN
Yeast Wet Prep HPF POC: NONE SEEN

## 2020-03-08 LAB — URINALYSIS, ROUTINE W REFLEX MICROSCOPIC
Bilirubin Urine: NEGATIVE
Glucose, UA: NEGATIVE mg/dL
Hgb urine dipstick: NEGATIVE
Ketones, ur: NEGATIVE mg/dL
Nitrite: NEGATIVE
Protein, ur: NEGATIVE mg/dL
Specific Gravity, Urine: 1.017 (ref 1.005–1.030)
pH: 6 (ref 5.0–8.0)

## 2020-03-08 MED ORDER — OMEPRAZOLE 20 MG PO CPDR
20.0000 mg | DELAYED_RELEASE_CAPSULE | Freq: Every day | ORAL | 0 refills | Status: DC
Start: 2020-03-08 — End: 2021-10-05

## 2020-03-08 MED ORDER — IOHEXOL 300 MG/ML  SOLN
100.0000 mL | Freq: Once | INTRAMUSCULAR | Status: AC | PRN
  Administered 2020-03-08: 100 mL via INTRAVENOUS

## 2020-03-08 MED ORDER — SODIUM CHLORIDE (PF) 0.9 % IJ SOLN
INTRAMUSCULAR | Status: AC
  Filled 2020-03-08: qty 50

## 2020-03-08 NOTE — Telephone Encounter (Signed)
Pharmacy needs clarification on if patients Xolair is in vials or prefilled syringes

## 2020-03-08 NOTE — Telephone Encounter (Signed)
Please advise 

## 2020-03-08 NOTE — ED Notes (Signed)
Pelvic exam finished. Patient resting comfortably

## 2020-03-08 NOTE — ED Notes (Signed)
Pt to CT

## 2020-03-08 NOTE — ED Notes (Signed)
Paper work discussed with patient no questions at this time

## 2020-03-08 NOTE — ED Provider Notes (Signed)
[facility]-EMERGENCY DEPT Provider Note   CSN: [id number] Arrival date & time: 03/07/20  2022     History Chief Complaint  Patient presents with  . Abdominal Pain    Shannon Huerta is a 29 y.o. female w PMHx asthma, IBS, autoimmune urticaria, presenting to the ED with complaint of abdominal pain that began last Wednesday. She reports sudden onset of periumbilical abdominal pain. Pain initially resolved after the first day, however returned. She reports associated soft stools and nausea.   She was evaluated at urgent care and treated with GI cocktail, prescribed zofran and bentyl. Reports the zofran improved nausea, however pain continues to recur. She states today she noticed food may have aggravated her symptoms. Pain is described as dull pain with burning sensation, periumbilical, radiating out to the sides of her abdomen and down towards suprapubic region.  Denies assoc urinary sx, hematochezia, vomiting, pelvic pain, fevers.  Of note, pt had uncomplicated spontaneous vaginal delivery on Apr 25. She states she bled longer than 6 weeks, however has stopped and has been feeling well. She reports she had some different vaginal discharge just prior to her menstrual period that started on July 23, however since menstruating it has not recurred.      The history is provided by the patient.       Past Medical History:  Diagnosis Date  . Angio-edema   . Anxiety 2012  . Asthma   . Urticaria     Patient Active Problem List   Diagnosis Date Noted  . Recurrent urticaria 01/05/2020  . Angioedema 01/05/2020  . Allergic rhinitis 01/05/2020  . Mild intermittent asthma 01/05/2020    No past surgical history on file.   OB History   No obstetric history on file.     Family History  Problem Relation Age of Onset  . Lupus Mother   . Asthma Mother   . Urticaria Father   . Lupus Maternal Uncle     Social History   Tobacco Use  . Smoking status: Never Smoker   . Smokeless tobacco: Never Used  Vaping Use  . Vaping Use: Never used  Substance Use Topics  . Alcohol use: Not on file  . Drug use: Not on file    Home Medications Prior to Admission medications   Medication Sig Start Date End Date Taking? Authorizing Provider  acetaminophen (TYLENOL) 500 MG tablet Take 500 mg by mouth every 6 (six) hours as needed.    [provider]  cetirizine (ZYRTEC) 10 MG tablet Take 1 tablet (10 mg total) by mouth in the morning and at bedtime. 02/12/20   Ellamae Sia, DO  dicyclomine (BENTYL) 20 MG tablet Take 1 tablet (20 mg total) by mouth 2 (two) times daily. 03/02/20   Wurst, [place], PA-C  EPINEPHrine (AUVI-Q) 0.3 mg/0.3 mL IJ SOAJ injection Inject 0.3 mLs (0.3 mg total) into the muscle as needed for anaphylaxis. 01/05/20   Bobbitt, Heywood Iles, MD  famotidine (PEPCID) 20 MG tablet Take 1 tablet (20 mg total) by mouth 2 (two) times daily. 01/05/20   Bobbitt, Heywood Iles, MD  hydrOXYzine (ATARAX/VISTARIL) 25 MG tablet Take by mouth. 12/22/19   [provider]  levocetirizine (XYZAL) 5 MG tablet Take 1 tablet (5 mg total) by mouth daily as needed for allergies. 01/05/20   Bobbitt, Heywood Iles, MD  methylPREDNISolone (MEDROL DOSEPAK) 4 MG TBPK tablet See admin instructions. follow package directions 12/04/19   [provider]  montelukast (SINGULAIR) 10 MG  tablet Take 1 tablet (10 mg total) by mouth at bedtime. 01/22/20   Bobbitt, Heywood Ilesalph Carter, MD  Multiple Vitamins-Minerals (MULTIVITAMIN WITH MINERALS) tablet Take by mouth.    [provider]  omalizumab Geoffry Paradise(XOLAIR) 150 MG injection Inject 300 mg into the skin every 28 (twenty-eight) days. 02/22/20   Bobbitt, Heywood Ilesalph Carter, MD  omeprazole (PRILOSEC) 20 MG capsule Take 1 capsule (20 mg total) by mouth daily. 03/08/20   Amaiya Scruton, [place] N, PA-C  ondansetron (ZOFRAN) 4 MG tablet Take 1 tablet (4 mg total) by mouth every 6 (six) hours. 03/02/20   Wurst, [place], PA-C  predniSONE (DELTASONE) 10 MG  tablet Take 1 tablet (10 mg total) by mouth daily with breakfast. As needed for hives 02/12/20   Ellamae SiaKim, Yoon M, DO  azelastine (ASTELIN) 0.1 % nasal spray Place 1-2 sprays into both nostrils 2 (two) times daily as needed for rhinitis. 01/05/20 03/02/20  Bobbitt, Heywood Ilesalph Carter, MD    Allergies    Patient has no known allergies.  Review of Systems   Review of Systems  Gastrointestinal: Positive for abdominal pain and nausea.  All other systems reviewed and are negative.   Physical Exam Updated Vital Signs BP 105/73   Pulse 81   Temp 98.3 F (36.8 C) (Oral)   Resp 17   SpO2 99%   Physical Exam Vitals and nursing note reviewed. Exam conducted with a chaperone present.  Constitutional:      General: She is not in acute distress.    Appearance: She is well-developed. She is not ill-appearing.  HENT:     Head: Normocephalic and atraumatic.  Eyes:     Conjunctiva/sclera: Conjunctivae normal.  Cardiovascular:     Rate and Rhythm: Normal rate and regular rhythm.  Pulmonary:     Effort: Pulmonary effort is normal. No respiratory distress.     Breath sounds: Normal breath sounds.  Abdominal:     General: Bowel sounds are normal.     Palpations: Abdomen is soft.     Tenderness: There is abdominal tenderness (mild discomfort with palpation of her periumbilical region and RUQ). There is no guarding or rebound.  Genitourinary:    General: Normal vulva.     Cervix: No cervical motion tenderness or erythema.     Uterus: Not tender.      Adnexa:        Right: No tenderness.         Left: No tenderness.       Comments: Small-moderate amount of clear/white discharge present. No erythema. No TTP of cervix or adnexa. Skin:    General: Skin is warm.  Neurological:     Mental Status: She is alert.  Psychiatric:        Behavior: Behavior normal.     ED Results / Procedures / Treatments   Labs (all labs ordered are listed, but only abnormal results are displayed) Labs Reviewed  WET PREP,  GENITAL - Abnormal; Notable for the following components:      Result Value   WBC, Wet Prep HPF POC MANY (*)    All other components within normal limits  CBC - Abnormal; Notable for the following components:   RBC 5.18 (*)    MCH 25.9 (*)    All other components within normal limits  URINALYSIS, ROUTINE W REFLEX MICROSCOPIC - Abnormal; Notable for the following components:   Leukocytes,Ua TRACE (*)    Bacteria, UA MANY (*)    All other components within normal limits  LIPASE, BLOOD  COMPREHENSIVE METABOLIC PANEL  I-STAT BETA HCG BLOOD, ED ([facility], [facility], [facility] ONLY)  GC/CHLAMYDIA PROBE AMP ([facility]) NOT AT [facility]    EKG None  Radiology CT Abdomen Pelvis W Contrast  Result Date: 03/08/2020 CLINICAL DATA:  Abdominal pain for a week, increasing. EXAM: CT ABDOMEN AND PELVIS WITH CONTRAST TECHNIQUE: Multidetector CT imaging of the abdomen and pelvis was performed using the standard protocol following bolus administration of intravenous contrast. CONTRAST:  OMNIPAQUE IOHEXOL 300 MG/ML  SOLN COMPARISON:  None. FINDINGS: Lower chest:  No contributory findings. Hepatobiliary: No focal liver abnormality.No evidence of biliary obstruction or stone. Pancreas: Unremarkable. Spleen: Unremarkable. Adrenals/Urinary Tract: Negative adrenals. No hydronephrosis or stone. Unremarkable bladder. Stomach/Bowel:  No obstruction. No appendicitis. Vascular/Lymphatic: No acute vascular abnormality. No mass or adenopathy. Reproductive:No pathologic findings. Other: No ascites or pneumoperitoneum. Musculoskeletal: No acute abnormalities. IMPRESSION: Negative abdominal CT.  No explanation pain. Electronically Signed   By: Marnee Spring M.D.   On: 03/08/2020 08:08    Procedures Procedures (including critical care time)  Medications Ordered in ED Medications  sodium chloride flush (NS) 0.9 % injection 3 mL (3 mLs Intravenous Not Given 03/08/20 2671)  sodium chloride (PF) 0.9 % injection (  Not Given 03/08/20 0823)    iohexol (OMNIPAQUE) 300 MG/ML solution 100 mL (100 mLs Intravenous Contrast Given 03/08/20 0748)    ED Course  I have reviewed the triage vital signs and the nursing notes.  Pertinent labs & imaging results that were available during my care of the patient were reviewed by me and considered in my medical decision making (see chart for details).  Clinical Course as of Mar 08 921  Tue Mar 08, 2020  0910 Consulted with ED pharmacist regarding plaquenil and antacid, either PPI vs H2 blocker. QT risk is mostly regarding plaquenil, electrolytes are normal. She reports relatively low risk, should be alright in this patient.   [JR]    Clinical Course User Index [JR] Anaalicia Reimann, [place] N, PA-C   MDM Rules/Calculators/A&P                          Pt w hx of IBS, recent SVD on April 25 w/o complication, presenting to the ED with intermittent periumbilical abdominal pain.  Pain is described as a dull sometimes burning pain, it radiates down towards her suprapubic area as well as bilateral sides of her abdomen and some in her back.  No regular alcohol use or recent alcohol use, no recent NSAID use.  No history of abdominal surgeries.  On exam, she has some reported "discomfort" with palpation in the periumbilical region, no guarding or rebound.  She is overall very well-appearing and in no distress.  She was evaluated at urgent care last week, scribed Bentyl and Zofran.  Labs are very reassuring, no leukocytosis or anemia, anabolic panel with normal renal and hepatic function, normal lipase and negative pregnancy.  UA with bacteria though normal whites and trace leuks.  She does have 11-20 squamous cells, this is likely contaminated specimen.  Will obtain CT scan given patient's duration of symptoms and severity of pain when it occurs.  Pelvic exam also performed as she reported some abnormal vaginal discharge prior to last menstrual period.  Pelvic exam with small to moderate amount of clear/white discharge  present.  No vaginal erythema, no CMT or adnexal tenderness to suggest PID.  Wet prep with white cells though no clue cells, yeast or trichomoniasis.   CT scan  is normal.  Patient is resting comfortably on reevaluation.  Vital signs remain stable.  Discussed reassuring work-up and then for outpatient follow-up with continued symptomatic management.  Recommend patient to begin taking an antacid as this may be related to gastritis versus PUD.  She mentions she takes Plaquenil, this was discussed with ED pharmacist who recommends PPI is safe in this patient with normal electrolytes.  Recommend close follow-up with PCP, strict return precautions.  She was understanding, agrees with care plan and is safe for discharge.   Final Clinical Impression(s) / ED Diagnoses Final diagnoses:  Periumbilical abdominal pain    Rx / DC Orders ED Discharge Orders         Ordered    omeprazole (PRILOSEC) 20 MG capsule  Daily     Discontinue  Reprint     03/08/20 0913           Atalie Oros, [place] N, PA-C 03/08/20 5189    Shon Baton, MD 03/14/20 [phone]

## 2020-03-08 NOTE — Telephone Encounter (Signed)
Called and provided clarification.

## 2020-03-08 NOTE — Discharge Instructions (Addendum)
You can take prilosec once daily, this may help your symptoms.  Your CT scan is normal. Your pelvic exam is also reassuring. Please schedule an appointment with your primary care provider regarding your ongoing symptoms. Return to the ER if you develop fever, severely worsening pain, or uncontrollable vomiting.

## 2020-03-09 LAB — GC/CHLAMYDIA PROBE AMP (~~LOC~~) NOT AT ARMC
Chlamydia: NEGATIVE
Comment: NEGATIVE
Comment: NORMAL
Neisseria Gonorrhea: NEGATIVE

## 2020-03-10 DIAGNOSIS — K581 Irritable bowel syndrome with constipation: Secondary | ICD-10-CM | POA: Insufficient documentation

## 2020-03-25 ENCOUNTER — Ambulatory Visit

## 2020-03-28 ENCOUNTER — Ambulatory Visit: Payer: Self-pay

## 2020-03-28 ENCOUNTER — Ambulatory Visit

## 2020-03-29 ENCOUNTER — Other Ambulatory Visit: Payer: Self-pay

## 2020-03-29 ENCOUNTER — Ambulatory Visit (INDEPENDENT_AMBULATORY_CARE_PROVIDER_SITE_OTHER)

## 2020-03-29 DIAGNOSIS — L500 Allergic urticaria: Secondary | ICD-10-CM

## 2020-04-04 ENCOUNTER — Telehealth: Payer: Self-pay | Admitting: Allergy and Immunology

## 2020-04-04 NOTE — Telephone Encounter (Signed)
Tried calling pt lm for pt to call us back Need to know when out she started breaking out Also what medications she has used

## 2020-04-04 NOTE — Telephone Encounter (Signed)
Pt. request prescription for hives on body and face. preferably prednisone or seeking advice for a solution.

## 2020-04-05 NOTE — Telephone Encounter (Signed)
Pt. States her hives are constant but the hives on her face it's been about 3 days. She has tried, pepcid, Hydroxyzine, and zyrtec.

## 2020-04-08 MED ORDER — PREDNISONE 10 MG PO TABS: ORAL_TABLET | ORAL | 0 refills | Status: DC

## 2020-04-08 NOTE — Telephone Encounter (Signed)
Tried calling- left a message to inform her that prednisone was sent in. We will follow up with her on Tuesday.

## 2020-04-08 NOTE — Telephone Encounter (Signed)
Pt states that she is taking Hydroxyzine every night, pepcid and zytec twice daily.

## 2020-04-08 NOTE — Telephone Encounter (Signed)
PT following up on this request for prednsione. Route to chrissie. Please call her back before the end of day.

## 2020-04-08 NOTE — Telephone Encounter (Signed)
Please have her continue her regimen and caution her about sedation with these medications and driving. Also, have her continue to get her Xolair injections. It can take up to 3 doses to get benefit. Please send in a prescription for prednisone 10 mg- take two tablets twice a day for 3 days, then on the 4th day take 2 tablets in the morning and on the 5th day take 1 tablet and stop. Quantity: 15 tablets with no refills.

## 2020-04-08 NOTE — Telephone Encounter (Signed)
What medications and how frequently is she taking these medications for her hives right now?

## 2020-04-12 NOTE — Telephone Encounter (Signed)
Spoke with pt- she did pick up the prednisone Friday and her hives are doing better.

## 2020-04-26 ENCOUNTER — Ambulatory Visit (INDEPENDENT_AMBULATORY_CARE_PROVIDER_SITE_OTHER)

## 2020-04-26 DIAGNOSIS — L501 Idiopathic urticaria: Secondary | ICD-10-CM

## 2020-05-12 ENCOUNTER — Telehealth: Payer: Self-pay | Admitting: Allergy and Immunology

## 2020-05-12 NOTE — Telephone Encounter (Signed)
Pt request presciption for prednisone for hives.

## 2020-05-12 NOTE — Telephone Encounter (Signed)
Left message for pt. To call office regarding her hives.

## 2020-05-25 ENCOUNTER — Other Ambulatory Visit: Payer: Self-pay

## 2020-05-25 ENCOUNTER — Ambulatory Visit (INDEPENDENT_AMBULATORY_CARE_PROVIDER_SITE_OTHER)

## 2020-05-25 DIAGNOSIS — L501 Idiopathic urticaria: Secondary | ICD-10-CM

## 2020-06-22 ENCOUNTER — Ambulatory Visit (INDEPENDENT_AMBULATORY_CARE_PROVIDER_SITE_OTHER): Admitting: *Deleted

## 2020-06-22 ENCOUNTER — Other Ambulatory Visit: Payer: Self-pay

## 2020-06-22 DIAGNOSIS — L501 Idiopathic urticaria: Secondary | ICD-10-CM | POA: Diagnosis not present

## 2020-07-20 ENCOUNTER — Other Ambulatory Visit: Payer: Self-pay

## 2020-07-20 ENCOUNTER — Ambulatory Visit (INDEPENDENT_AMBULATORY_CARE_PROVIDER_SITE_OTHER)

## 2020-07-20 DIAGNOSIS — L501 Idiopathic urticaria: Secondary | ICD-10-CM

## 2020-07-28 ENCOUNTER — Other Ambulatory Visit

## 2020-07-28 DIAGNOSIS — Z20822 Contact with and (suspected) exposure to covid-19: Secondary | ICD-10-CM

## 2020-07-30 LAB — SARS-COV-2, NAA 2 DAY TAT

## 2020-07-30 LAB — NOVEL CORONAVIRUS, NAA: SARS-CoV-2, NAA: NOT DETECTED

## 2020-08-17 ENCOUNTER — Ambulatory Visit: Payer: Self-pay

## 2020-08-18 ENCOUNTER — Ambulatory Visit (INDEPENDENT_AMBULATORY_CARE_PROVIDER_SITE_OTHER)

## 2020-08-18 DIAGNOSIS — L501 Idiopathic urticaria: Secondary | ICD-10-CM

## 2020-09-13 ENCOUNTER — Ambulatory Visit

## 2020-09-20 ENCOUNTER — Other Ambulatory Visit: Payer: Self-pay

## 2020-09-20 ENCOUNTER — Ambulatory Visit (INDEPENDENT_AMBULATORY_CARE_PROVIDER_SITE_OTHER)

## 2020-09-20 DIAGNOSIS — L501 Idiopathic urticaria: Secondary | ICD-10-CM

## 2020-10-18 ENCOUNTER — Ambulatory Visit: Payer: Self-pay

## 2020-10-25 ENCOUNTER — Other Ambulatory Visit: Payer: Self-pay

## 2020-10-25 ENCOUNTER — Ambulatory Visit (INDEPENDENT_AMBULATORY_CARE_PROVIDER_SITE_OTHER)

## 2020-10-25 DIAGNOSIS — L501 Idiopathic urticaria: Secondary | ICD-10-CM | POA: Diagnosis not present

## 2020-10-28 DIAGNOSIS — R5382 Chronic fatigue, unspecified: Secondary | ICD-10-CM | POA: Insufficient documentation

## 2020-11-22 ENCOUNTER — Ambulatory Visit: Payer: Self-pay

## 2020-11-30 ENCOUNTER — Other Ambulatory Visit: Payer: Self-pay

## 2020-11-30 ENCOUNTER — Ambulatory Visit (INDEPENDENT_AMBULATORY_CARE_PROVIDER_SITE_OTHER): Admitting: *Deleted

## 2020-11-30 DIAGNOSIS — L500 Allergic urticaria: Secondary | ICD-10-CM | POA: Diagnosis not present

## 2020-12-28 ENCOUNTER — Ambulatory Visit (INDEPENDENT_AMBULATORY_CARE_PROVIDER_SITE_OTHER)

## 2020-12-28 ENCOUNTER — Other Ambulatory Visit: Payer: Self-pay

## 2020-12-28 DIAGNOSIS — L500 Allergic urticaria: Secondary | ICD-10-CM

## 2021-01-25 ENCOUNTER — Ambulatory Visit: Payer: Self-pay

## 2021-02-08 ENCOUNTER — Ambulatory Visit (INDEPENDENT_AMBULATORY_CARE_PROVIDER_SITE_OTHER)

## 2021-02-08 ENCOUNTER — Other Ambulatory Visit: Payer: Self-pay

## 2021-02-08 DIAGNOSIS — L500 Allergic urticaria: Secondary | ICD-10-CM | POA: Diagnosis not present

## 2021-03-08 ENCOUNTER — Ambulatory Visit: Payer: Self-pay

## 2021-03-10 ENCOUNTER — Other Ambulatory Visit: Payer: Self-pay

## 2021-03-10 ENCOUNTER — Ambulatory Visit (INDEPENDENT_AMBULATORY_CARE_PROVIDER_SITE_OTHER)

## 2021-03-10 DIAGNOSIS — L500 Allergic urticaria: Secondary | ICD-10-CM | POA: Diagnosis not present

## 2021-04-07 ENCOUNTER — Ambulatory Visit

## 2021-04-13 ENCOUNTER — Other Ambulatory Visit: Payer: Self-pay

## 2021-04-13 ENCOUNTER — Ambulatory Visit (INDEPENDENT_AMBULATORY_CARE_PROVIDER_SITE_OTHER)

## 2021-04-13 DIAGNOSIS — L501 Idiopathic urticaria: Secondary | ICD-10-CM

## 2021-05-04 ENCOUNTER — Ambulatory Visit (INDEPENDENT_AMBULATORY_CARE_PROVIDER_SITE_OTHER): Admitting: Nurse Practitioner

## 2021-05-04 ENCOUNTER — Other Ambulatory Visit: Payer: Self-pay

## 2021-05-04 ENCOUNTER — Encounter: Payer: Self-pay | Admitting: Nurse Practitioner

## 2021-05-04 ENCOUNTER — Other Ambulatory Visit (HOSPITAL_COMMUNITY)
Admission: RE | Admit: 2021-05-04 | Discharge: 2021-05-04 | Disposition: A | Discharge status: Home / Self Care | Source: Ambulatory Visit | Attending: Nurse Practitioner | Admitting: Nurse Practitioner

## 2021-05-04 VITALS — Temp 98.9°F | Ht 64.4 in | Wt 173.2 lb

## 2021-05-04 DIAGNOSIS — Z79899 Other long term (current) drug therapy: Secondary | ICD-10-CM

## 2021-05-04 DIAGNOSIS — Z8739 Personal history of other diseases of the musculoskeletal system and connective tissue: Secondary | ICD-10-CM

## 2021-05-04 DIAGNOSIS — N898 Other specified noninflammatory disorders of vagina: Secondary | ICD-10-CM | POA: Diagnosis present

## 2021-05-04 DIAGNOSIS — F3289 Other specified depressive episodes: Secondary | ICD-10-CM

## 2021-05-04 DIAGNOSIS — R221 Localized swelling, mass and lump, neck: Secondary | ICD-10-CM

## 2021-05-04 DIAGNOSIS — R768 Other specified abnormal immunological findings in serum: Secondary | ICD-10-CM

## 2021-05-04 DIAGNOSIS — R5383 Other fatigue: Secondary | ICD-10-CM

## 2021-05-04 MED ORDER — FLUCONAZOLE 100 MG PO TABS: 100.0000 mg | ORAL_TABLET | Freq: Every day | ORAL | 0 refills | Status: DC

## 2021-05-04 MED ORDER — METRONIDAZOLE 500 MG PO TABS: 500.0000 mg | ORAL_TABLET | Freq: Three times a day (TID) | ORAL | 0 refills | Status: DC

## 2021-05-04 NOTE — Progress Notes (Signed)
I,Shannon Huerta,acting as a Education administrator for [employer], FNP.,have documented all relevant documentation on the behalf of Shannon Brine, FNP,as directed by  Shannon Brine, FNP while in the presence of [place], [place].  This visit occurred during the SARS-CoV-2 public health emergency.  Safety protocols were in place, including screening questions prior to the visit, additional usage of staff PPE, and extensive cleaning of exam room while observing appropriate contact time as indicated for disinfecting solutions.  Subjective:     Patient ID: Shannon Huerta , female    DOB: 11-18-1990 , 30 y.o.   MRN: [id number]   Chief Complaint  Patient presents with   Headache   REFERRAL   boils   Establish Care    HPI  The patient is here to establish care, evaluation of boils, headaches, and a referral.  She had been going to [facility] - she decided she needed a new provider. 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 Married. Highest level of education is [employer].  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 She has seen an allergist (Dr. Starling Huerta).  She has taken medication in the past for anxiety but has not taken a medication in a long time. 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 She had been taking plaquenil - thought this would help with her hives. Hidradenitis - this has worsened recently.  She is originally from [place]. Her husband had found a job locally.  She does not have a lot of support here in the area.   She is also having chronic fatigue and hair loss.  She has also had a colonoscopy due to having problems with her stomach seen Gastro at [facility].     Headache  This is a chronic problem. The current episode started more than 1 year ago. The problem occurs constantly. The quality of the pain is described as aching. Associated symptoms include dizziness. Pertinent  negatives include no coughing, insomnia or sinus pressure. She has tried acetaminophen for the symptoms. There is no history of cancer, cluster headaches or migraine headaches.    Past Medical History:  Diagnosis Date   Angio-edema    Anxiety 2012   Asthma    Urticaria      Family History  Problem Relation Age of Onset   Lupus Mother    Asthma Mother    Urticaria Father    Healthy Father    Lupus Maternal Uncle      Current Outpatient Medications:    acetaminophen (TYLENOL) 500 MG tablet, Take 500 mg by mouth every 6 (six) hours as needed., Disp: , Rfl:    cetirizine (ZYRTEC) 10 MG tablet, Take 1 tablet (10 mg total) by mouth in the morning and at bedtime., Disp: 60 tablet, Rfl: 2   fluconazole (DIFLUCAN) 100 MG tablet, Take 1 tablet (100 mg total) by mouth daily. Take 1 tablet by mouth now repeat in 5 days, Disp: 2 tablet, Rfl: 0   hydrOXYzine (ATARAX/VISTARIL) 25 MG tablet, Take by mouth., Disp: , Rfl:    metroNIDAZOLE (FLAGYL) 500 MG tablet, Take 1 tablet (500 mg total) by mouth 3 (three) times daily for 10 days., Disp: 30 tablet, Rfl: 0   montelukast (SINGULAIR) 10 MG tablet, Take 1 tablet (10 mg total) by mouth at bedtime., Disp: 30 tablet, Rfl: 5   Multiple Vitamins-Minerals (MULTIVITAMIN WITH MINERALS)  tablet, Take by mouth., Disp: , Rfl:    omalizumab (XOLAIR) 150 MG injection, Inject 300 mg into the skin every 28 (twenty-eight) days., Disp: 6 each, Rfl: 11   omeprazole (PRILOSEC) 20 MG capsule, Take 1 capsule (20 mg total) by mouth daily., Disp: 10 capsule, Rfl: 0   ondansetron (ZOFRAN) 4 MG tablet, Take 1 tablet (4 mg total) by mouth every 6 (six) hours., Disp: 12 tablet, Rfl: 0   dicyclomine (BENTYL) 20 MG tablet, Take 1 tablet (20 mg total) by mouth 2 (two) times daily., Disp: 20 tablet, Rfl: 0   EPINEPHrine (AUVI-Q) 0.3 mg/0.3 mL IJ SOAJ injection, Inject 0.3 mLs (0.3 mg total) into the muscle as needed for anaphylaxis., Disp: 2 each, Rfl: 2   famotidine (PEPCID) 20 MG  tablet, Take 1 tablet (20 mg total) by mouth 2 (two) times daily., Disp: 60 tablet, Rfl: 5  Current Facility-Administered Medications:    omalizumab Arvid Right) injection 300 mg, 300 mg, Subcutaneous, Q28 days, Bobbitt, Sedalia Muta, MD, 300 mg at 04/13/21 1003   No Known Allergies   Review of Systems  Constitutional:  Positive for fatigue.  HENT:  Negative for sinus pressure.   Respiratory:  Negative for cough.   Cardiovascular: Negative.   Gastrointestinal: Negative.   Skin:  Positive for rash (inner thigh area).  Neurological:  Positive for dizziness, light-headedness and headaches.  Psychiatric/Behavioral: Negative.  The patient does not have insomnia.     Today's Vitals   05/04/21 1127  Temp: 98.9 F (37.2 C)  Weight: 173 lb 3.2 oz (78.6 kg)  Height: 5' 4.4" (1.636 m)   Body mass index is 29.36 kg/m.  Wt Readings from Last 3 Encounters:  05/04/21 173 lb 3.2 oz (78.6 kg)  01/05/20 183 lb 1.6 oz (83.1 kg)    BP Readings from Last 3 Encounters:  03/08/20 108/68  03/02/20 (!) 121/88  01/05/20 122/76    Objective:  Physical Exam Vitals reviewed.  Constitutional:      General: She is not in acute distress.    Appearance: She is well-developed.  Neck:     Thyroid: Thyroid mass (left side of neck) present.     Trachea: Trachea normal.   Cardiovascular:     Rate and Rhythm: Normal rate and regular rhythm.  Pulmonary:     Effort: Pulmonary effort is normal. No respiratory distress.     Breath sounds: Normal breath sounds. No wheezing.  Musculoskeletal:     Cervical back: Full passive range of motion without pain.  Skin:    Findings: Rash (inner thighs with multiple crater like areas) present.  Neurological:     Mental Status: She is alert and oriented to person, place, and time.  Psychiatric:        Mood and Affect: Mood normal. Mood is not anxious or depressed.        Behavior: Behavior normal.        Assessment And Plan:     1. Positive ANA (antinuclear  antibody) Comments: Reports had been seen by Rheumatology in the past due to this positive ANA, will refer to Rheumatology at [facility] for further workup - Ambulatory referral to Rheumatology  2. Vaginal discharge Comments: Slightly thick pale yellow secretions - Cervicovaginal ancillary only - metroNIDAZOLE (FLAGYL) 500 MG tablet; Take 1 tablet (500 mg total) by mouth 3 (three) times daily for 10 days.  Dispense: 30 tablet; Refill: 0 - fluconazole (DIFLUCAN) 100 MG tablet; Take 1 tablet (100 mg total) by mouth  daily. Take 1 tablet by mouth now repeat in 5 days  Dispense: 2 tablet; Refill: 0  3. Fatigue, unspecified type Comments: Will check for metabolic causes - TSH - Vitamin B12 - CBC - BMP8+eGFR - Iron, TIBC and Ferritin Panel - Hemoglobin A1c - VITAMIN D 25 Hydroxy (Vit-D Deficiency, Fractures)  4. Lump in neck Comments: Firm area to left neck, will check thyroid/neck ultrasound - US THYROID; Future  5. History of connective tissue disease - Ambulatory referral to Rheumatology  6. Other long term (current) drug therapy Was on Plaquenil for 3 months will refer to opthalmology - Ambulatory referral to Ophthalmology   7. 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 Patient verbalized understanding of the plan and was able to repeat key elements of the plan. All questions were answered to their satisfaction.  [place] Brine, FNP   I, [place] Brine, FNP, have reviewed all documentation for this visit. The documentation on 05/04/21 for the exam, diagnosis, procedures, and orders are all accurate and complete.   IF YOU HAVE BEEN REFERRED TO A SPECIALIST, IT MAY TAKE 1-2 WEEKS TO SCHEDULE/PROCESS THE REFERRAL. IF YOU HAVE NOT HEARD FROM US/SPECIALIST IN TWO WEEKS, PLEASE GIVE [place] A CALL AT [phone]  [phone].   THE PATIENT IS ENCOURAGED TO PRACTICE SOCIAL DISTANCING DUE TO THE COVID-19 PANDEMIC.

## 2021-05-05 LAB — CBC
Hematocrit: 41.5 % (ref 34.0–46.6)
Hemoglobin: 13.7 g/dL (ref 11.1–15.9)
MCH: 24.6 pg — ABNORMAL LOW (ref 26.6–33.0)
MCHC: 33 g/dL (ref 31.5–35.7)
MCV: 75 fL — ABNORMAL LOW (ref 79–97)
Platelets: 256 10*3/uL (ref 150–450)
RBC: 5.57 x10E6/uL — ABNORMAL HIGH (ref 3.77–5.28)
RDW: 14.2 % (ref 11.7–15.4)
WBC: 7.1 10*3/uL (ref 3.4–10.8)

## 2021-05-05 LAB — BMP8+EGFR
BUN/Creatinine Ratio: 18 (ref 9–23)
BUN: 14 mg/dL (ref 6–20)
CO2: 20 mmol/L (ref 20–29)
Calcium: 9.3 mg/dL (ref 8.7–10.2)
Chloride: 102 mmol/L (ref 96–106)
Creatinine, Ser: 0.79 mg/dL (ref 0.57–1.00)
Glucose: 101 mg/dL — ABNORMAL HIGH (ref 70–99)
Potassium: 4.1 mmol/L (ref 3.5–5.2)
Sodium: 137 mmol/L (ref 134–144)
eGFR: 104 mL/min/{1.73_m2} (ref >59)

## 2021-05-05 LAB — HEMOGLOBIN A1C
Est. average glucose Bld gHb Est-mCnc: 105 mg/dL
Hgb A1c MFr Bld: 5.3 % (ref 4.8–5.6)

## 2021-05-05 LAB — IRON,TIBC AND FERRITIN PANEL
Ferritin: 67 ng/mL (ref 15–150)
Iron Saturation: 25 % (ref 15–55)
Iron: 77 ug/dL (ref 27–159)
Total Iron Binding Capacity: 302 ug/dL (ref 250–450)
UIBC: 225 ug/dL (ref 131–425)

## 2021-05-05 LAB — TSH: TSH: 1.76 u[IU]/mL (ref 0.450–4.500)

## 2021-05-05 LAB — VITAMIN B12: Vitamin B-12: 667 pg/mL (ref 232–1245)

## 2021-05-05 LAB — VITAMIN D 25 HYDROXY (VIT D DEFICIENCY, FRACTURES): Vit D, 25-Hydroxy: 31.1 ng/mL (ref 30.0–100.0)

## 2021-05-08 ENCOUNTER — Encounter: Payer: Self-pay | Admitting: Nurse Practitioner

## 2021-05-08 ENCOUNTER — Other Ambulatory Visit: Payer: Self-pay | Admitting: Nurse Practitioner

## 2021-05-08 LAB — CERVICOVAGINAL ANCILLARY ONLY
Bacterial Vaginitis (gardnerella): NEGATIVE
Candida Glabrata: NEGATIVE
Candida Vaginitis: NEGATIVE
Chlamydia: NEGATIVE
Comment: NEGATIVE
Comment: NEGATIVE
Comment: NEGATIVE
Comment: NEGATIVE
Comment: NEGATIVE
Comment: NORMAL
Neisseria Gonorrhea: NEGATIVE
Trichomonas: NEGATIVE

## 2021-05-08 MED ORDER — METRONIDAZOLE 0.75 % VA GEL: 1.0000 | Freq: Two times a day (BID) | VAGINAL | 0 refills | Status: DC

## 2021-05-11 ENCOUNTER — Other Ambulatory Visit: Payer: Self-pay

## 2021-05-11 ENCOUNTER — Other Ambulatory Visit (INDEPENDENT_AMBULATORY_CARE_PROVIDER_SITE_OTHER)

## 2021-05-11 ENCOUNTER — Ambulatory Visit

## 2021-05-11 DIAGNOSIS — N898 Other specified noninflammatory disorders of vagina: Secondary | ICD-10-CM

## 2021-05-11 LAB — POCT URINALYSIS DIPSTICK
Bilirubin, UA: NEGATIVE
Blood, UA: NEGATIVE
Glucose, UA: NEGATIVE
Ketones, UA: NEGATIVE
Leukocytes, UA: NEGATIVE
Nitrite, UA: NEGATIVE
Protein, UA: NEGATIVE
Spec Grav, UA: >=1.03 — AB (ref 1.010–1.025)
Urobilinogen, UA: 0.2 E.U./dL
pH, UA: 5.5 (ref 5.0–8.0)

## 2021-05-12 ENCOUNTER — Ambulatory Visit

## 2021-05-16 ENCOUNTER — Other Ambulatory Visit: Payer: Self-pay

## 2021-05-16 ENCOUNTER — Encounter: Payer: Self-pay | Admitting: Nurse Practitioner

## 2021-05-16 ENCOUNTER — Ambulatory Visit (INDEPENDENT_AMBULATORY_CARE_PROVIDER_SITE_OTHER)

## 2021-05-16 ENCOUNTER — Ambulatory Visit
Admission: RE | Admit: 2021-05-16 | Discharge: 2021-05-16 | Disposition: A | Discharge status: Home / Self Care | Source: Ambulatory Visit | Attending: Nurse Practitioner | Admitting: Nurse Practitioner

## 2021-05-16 DIAGNOSIS — R221 Localized swelling, mass and lump, neck: Secondary | ICD-10-CM

## 2021-05-16 DIAGNOSIS — L501 Idiopathic urticaria: Secondary | ICD-10-CM

## 2021-06-13 ENCOUNTER — Ambulatory Visit

## 2021-06-15 ENCOUNTER — Encounter: Admitting: Nurse Practitioner

## 2021-06-15 NOTE — Progress Notes (Signed)
No show

## 2021-06-22 ENCOUNTER — Ambulatory Visit: Admitting: Nurse Practitioner

## 2021-06-27 IMAGING — CT CT ABD-PELV W/ CM
2 of 7 series · 16 of 46 positions shown, 18 images · IV contrast (OMNIPAQUE 300)
Comparison: None.

CLINICAL DATA: Abdominal pain for a week, increasing.

EXAM:
CT ABDOMEN AND PELVIS WITH CONTRAST
TECHNIQUE: Multidetector CT imaging of the abdomen and pelvis was performed
using the standard protocol following bolus administration of
intravenous contrast.
CONTRAST:  100mL OMNIPAQUE IOHEXOL 300 MG/ML  SOLN

[Series 2: axial st · axial · 0.68mm/px · z∈[+1005,+1405]mm · 13 of 92 slices shown, 15 images]
[im 6/92  soft-tissue]
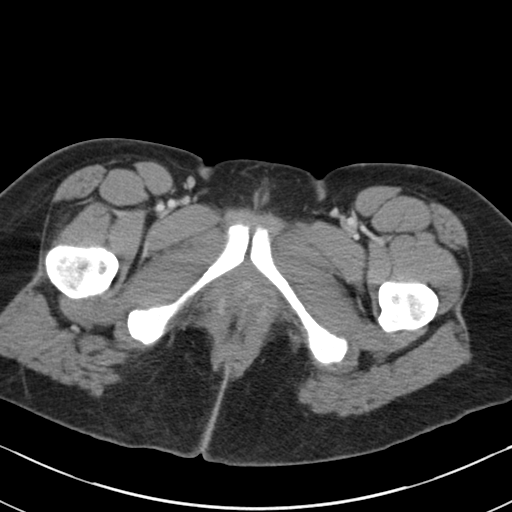
[im 6/92  bone]
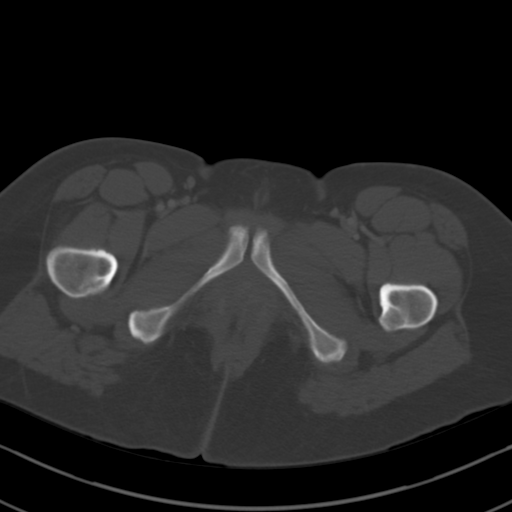
[im 11/92  soft-tissue]
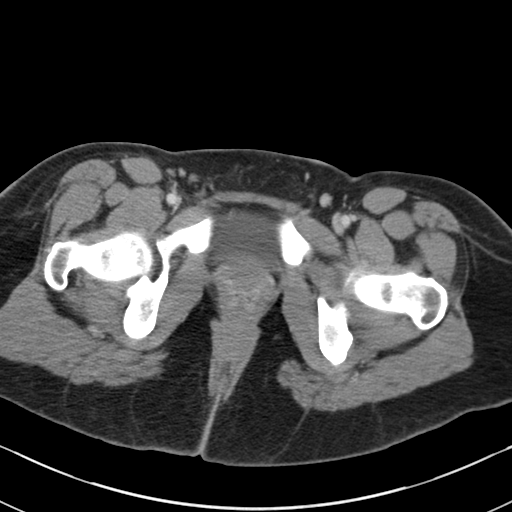
[im 22/92  soft-tissue]
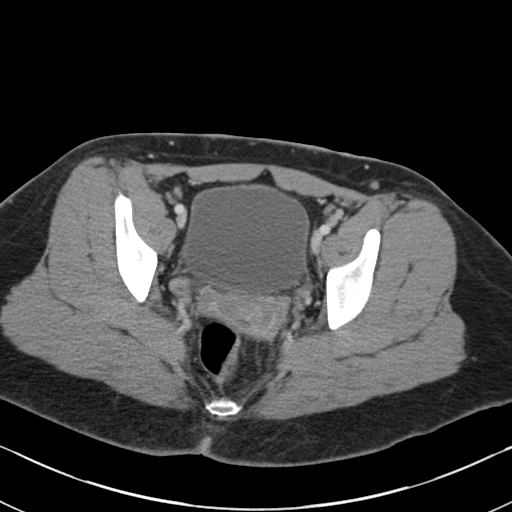
[im 27/92  soft-tissue]
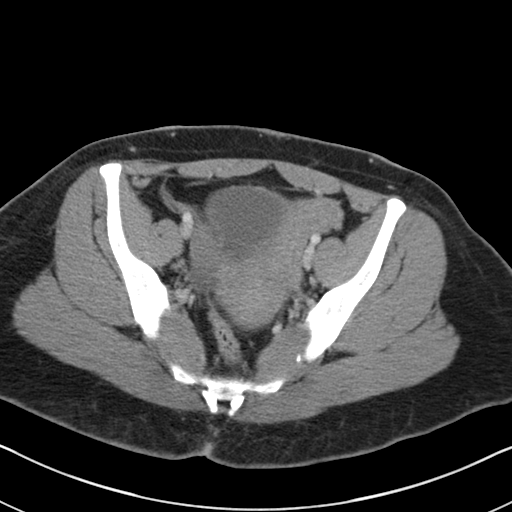
[im 33/92  soft-tissue]
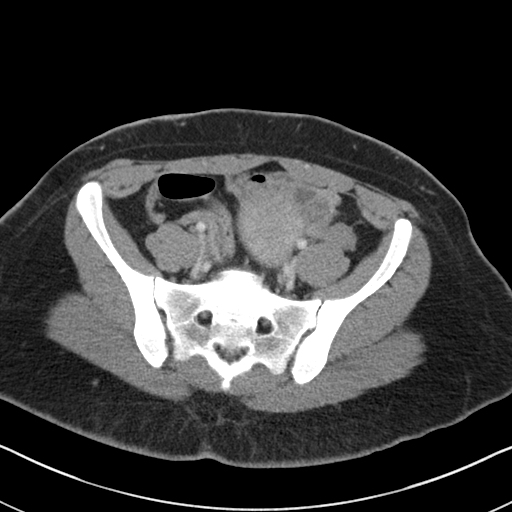
[im 38/92  soft-tissue]
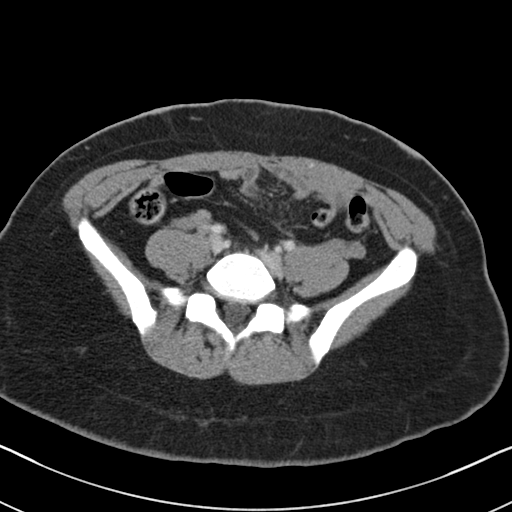
[im 49/92  soft-tissue]
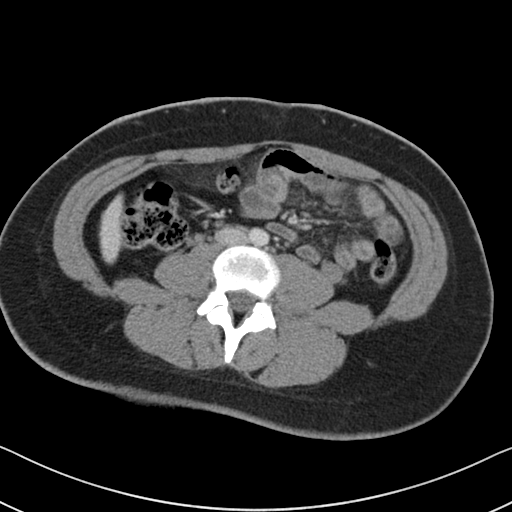
[im 54/92  soft-tissue]
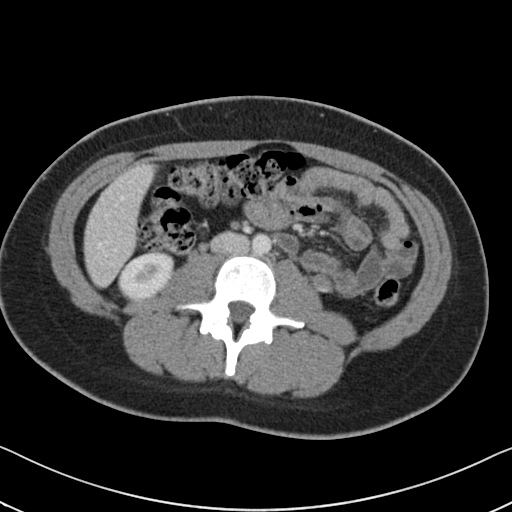
[im 59/92  soft-tissue]
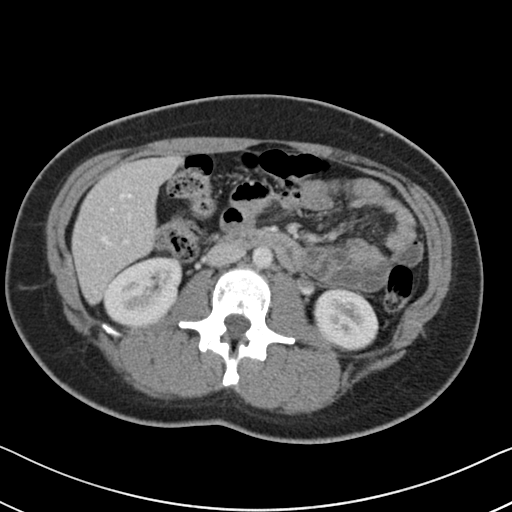
[im 59/92  bone]
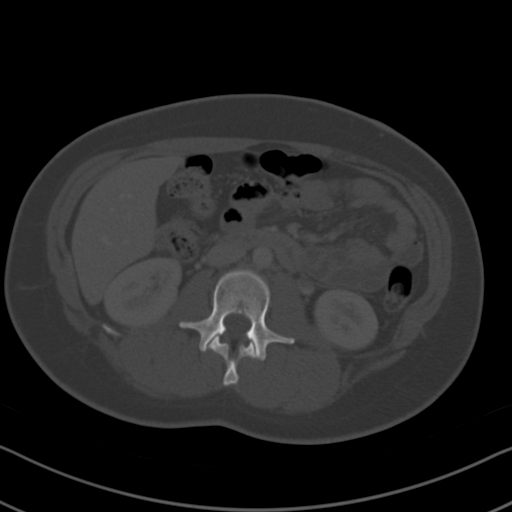
[im 65/92  soft-tissue]
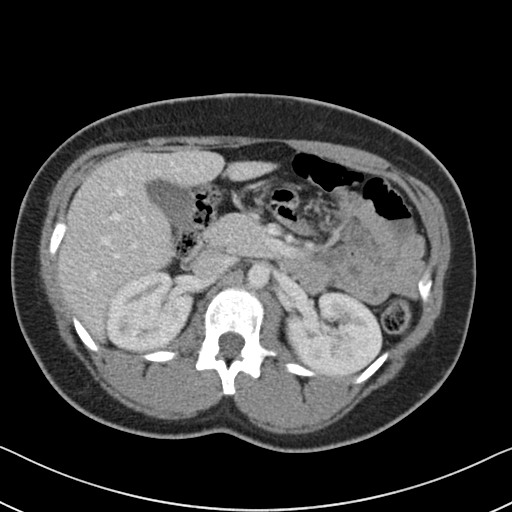
[im 70/92  soft-tissue]
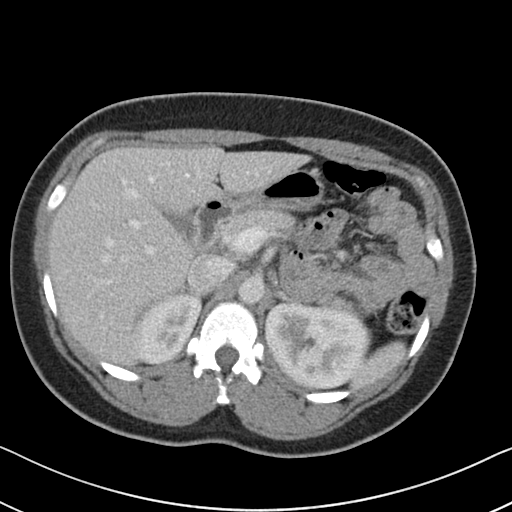
[im 81/92  soft-tissue]
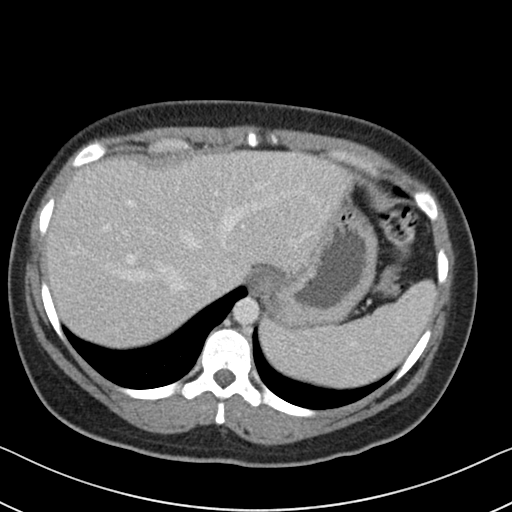
[im 86/92  soft-tissue]
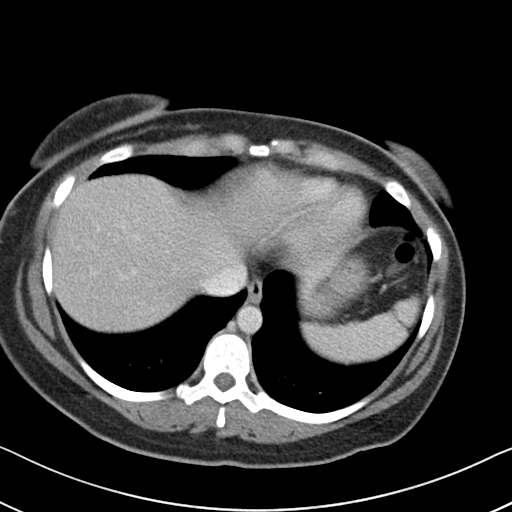

[Series 8: coronal st · coronal · 0.65mm/px · 3 of 118 slices shown]
[im 40/118  soft-tissue]
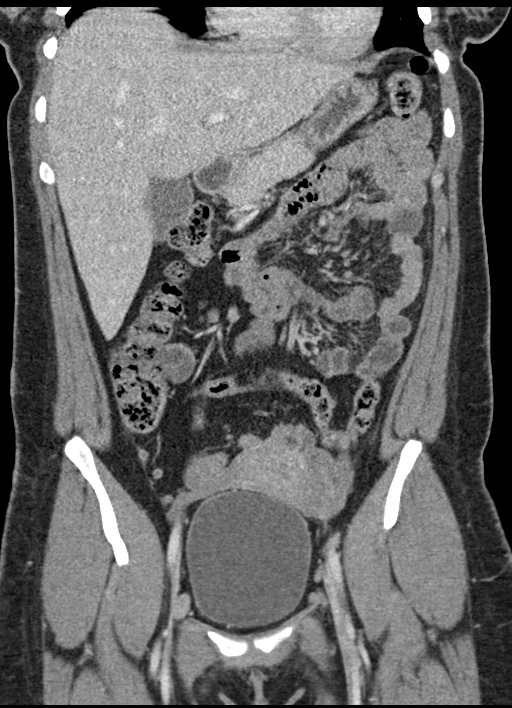
[im 53/118  soft-tissue]
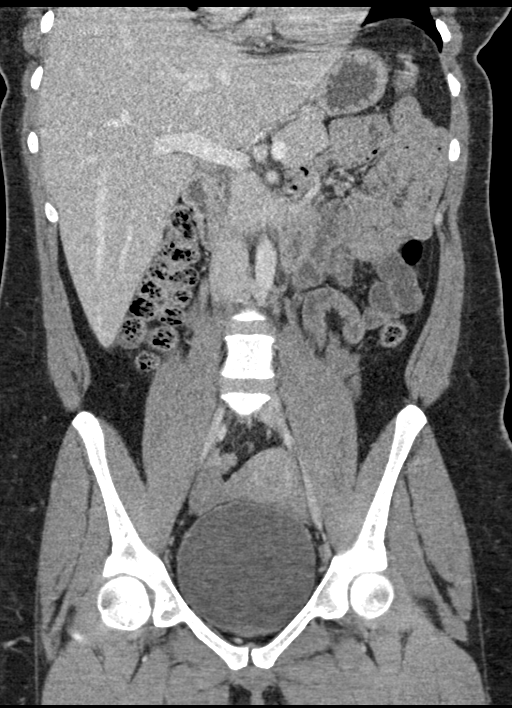
[im 66/118  soft-tissue]
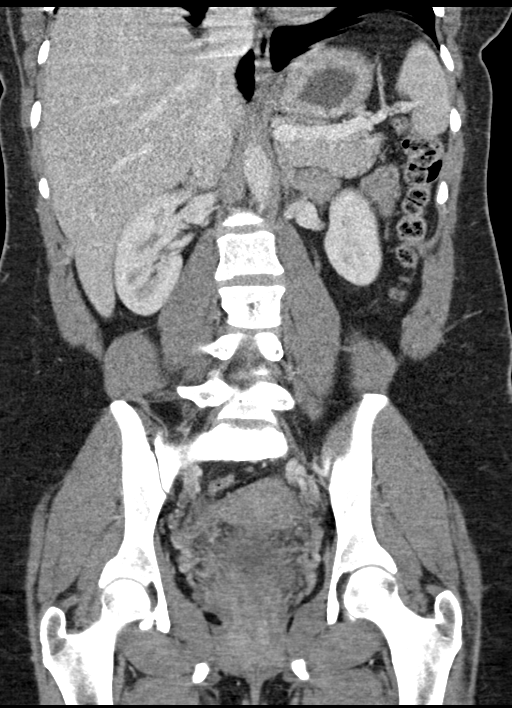

[16 of 46 positions shown; findings below may reference images not displayed]

FINDINGS: Lower chest:  No contributory findings.

Hepatobiliary: No focal liver abnormality.No evidence of biliary
obstruction or stone.

Pancreas: Unremarkable.

Spleen: Unremarkable.

Adrenals/Urinary Tract: Negative adrenals. No hydronephrosis or
stone. Unremarkable bladder.

Stomach/Bowel:  No obstruction. No appendicitis.

Vascular/Lymphatic: No acute vascular abnormality. No mass or
adenopathy.

Reproductive:No pathologic findings.

Other: No ascites or pneumoperitoneum.

Musculoskeletal: No acute abnormalities.
IMPRESSION: Negative abdominal CT.  No explanation pain.

## 2021-06-28 ENCOUNTER — Ambulatory Visit

## 2021-07-03 ENCOUNTER — Ambulatory Visit (INDEPENDENT_AMBULATORY_CARE_PROVIDER_SITE_OTHER)

## 2021-07-03 DIAGNOSIS — L501 Idiopathic urticaria: Secondary | ICD-10-CM

## 2021-08-01 ENCOUNTER — Other Ambulatory Visit: Payer: Self-pay

## 2021-08-01 ENCOUNTER — Ambulatory Visit (INDEPENDENT_AMBULATORY_CARE_PROVIDER_SITE_OTHER)

## 2021-08-01 DIAGNOSIS — L500 Allergic urticaria: Secondary | ICD-10-CM | POA: Diagnosis not present

## 2021-08-29 ENCOUNTER — Ambulatory Visit

## 2021-09-07 ENCOUNTER — Ambulatory Visit (INDEPENDENT_AMBULATORY_CARE_PROVIDER_SITE_OTHER)

## 2021-09-07 ENCOUNTER — Other Ambulatory Visit: Payer: Self-pay

## 2021-09-07 DIAGNOSIS — L501 Idiopathic urticaria: Secondary | ICD-10-CM | POA: Diagnosis not present

## 2021-10-05 ENCOUNTER — Ambulatory Visit (INDEPENDENT_AMBULATORY_CARE_PROVIDER_SITE_OTHER): Admitting: Nurse Practitioner

## 2021-10-05 ENCOUNTER — Other Ambulatory Visit: Payer: Self-pay

## 2021-10-05 ENCOUNTER — Encounter: Payer: Self-pay | Admitting: Nurse Practitioner

## 2021-10-05 VITALS — BP 122/70 | HR 70 | Temp 98.5°F | Ht 64.4 in | Wt 181.6 lb

## 2021-10-05 DIAGNOSIS — R42 Dizziness and giddiness: Secondary | ICD-10-CM

## 2021-10-05 DIAGNOSIS — N898 Other specified noninflammatory disorders of vagina: Secondary | ICD-10-CM | POA: Diagnosis not present

## 2021-10-05 DIAGNOSIS — R5383 Other fatigue: Secondary | ICD-10-CM

## 2021-10-05 DIAGNOSIS — L732 Hidradenitis suppurativa: Secondary | ICD-10-CM | POA: Diagnosis not present

## 2021-10-05 DIAGNOSIS — Z114 Encounter for screening for human immunodeficiency virus [HIV]: Secondary | ICD-10-CM

## 2021-10-05 LAB — POCT URINALYSIS DIPSTICK
Bilirubin, UA: NEGATIVE
Blood, UA: NEGATIVE
Glucose, UA: NEGATIVE
Ketones, UA: NEGATIVE
Leukocytes, UA: NEGATIVE
Nitrite, UA: NEGATIVE
Protein, UA: NEGATIVE
Spec Grav, UA: 1.015 (ref 1.010–1.025)
Urobilinogen, UA: 0.2 E.U./dL
pH, UA: 5.5 (ref 5.0–8.0)

## 2021-10-05 MED ORDER — DOXYCYCLINE HYCLATE 50 MG PO CAPS: 50.0000 mg | ORAL_CAPSULE | Freq: Every day | ORAL | 1 refills | Status: DC

## 2021-10-05 NOTE — Patient Instructions (Addendum)
Fatigue ?If you have fatigue, you feel tired all the time and have a lack of energy or a lack of motivation. Fatigue may make it difficult to start or complete tasks because of exhaustion. In general, occasional or mild fatigue is often a normal response to activity or life. However, long-lasting (chronic) or extreme fatigue may be a symptom of a medical condition. ?Follow these instructions at home: ?General instructions ?Watch your fatigue for any changes. ?Go to bed and get up at the same time every day. ?Avoid fatigue by pacing yourself during the day and getting enough sleep at night. ?Maintain a healthy weight. ?Medicines ?Take over-the-counter and prescription medicines only as told by your health care provider. ?Take a multivitamin, if told by your health care provider.  ?Do not use herbal or dietary supplements unless they are approved by your health care provider. ?Activity ? ?Exercise regularly, as told by your health care provider. ?Use or practice techniques to help you relax, such as yoga, tai chi, meditation, or massage therapy. ?Eating and drinking ? ?Avoid heavy meals in the evening. ?Eat a well-balanced diet, which includes lean proteins, whole grains, plenty of fruits and vegetables, and low-fat dairy products. ?Avoid consuming too much caffeine. ?Avoid the use of alcohol. ?Drink enough fluid to keep your urine pale yellow. ?Lifestyle ?Change situations that cause you stress. Try to keep your work and personal schedule in balance. ?Do not use any products that contain nicotine or tobacco, such as cigarettes and e-cigarettes. If you need help quitting, ask your health care provider. ?Do not use drugs. ?Contact a health care provider if: ?Your fatigue does not get better. ?You have a fever. ?You suddenly lose or gain weight. ?You have headaches. ?You have trouble falling asleep or sleeping through the night. ?You feel angry, guilty, anxious, or sad. ?You are unable to have a bowel movement  (constipation). ?Your skin is dry. ?You have swelling in your legs or another part of your body. ?Get help right away if: ?You feel confused. ?Your vision is blurry. ?You feel faint or you pass out. ?You have a severe headache. ?You have severe pain in your abdomen, your back, or the area between your waist and hips (pelvis). ?You have chest pain, shortness of breath, or an irregular or fast heartbeat. ?You are unable to urinate, or you urinate less than normal. ?You have abnormal bleeding, such as bleeding from the rectum, vagina, nose, lungs, or nipples. ?You vomit blood. ?You have thoughts about hurting yourself or others. ?If you ever feel like you may hurt yourself or others, or have thoughts about taking your own life, get help right away. You can go to your nearest emergency department or call: ?Your local emergency services (911 in the [place] ?A suicide crisis helpline, such as the National Suicide Prevention Lifeline at 1-800-273-8255 or 988 in the [place] This is open 24 hours a day. ?Summary ?If you have fatigue, you feel tired all the time and have a lack of energy or a lack of motivation. ?Fatigue may make it difficult to start or complete tasks because of exhaustion. ?Long-lasting (chronic) or extreme fatigue may be a symptom of a medical condition. ?Exercise regularly, as told by your health care provider. ?Change situations that cause you stress. Try to keep your work and personal schedule in balance. ?This information is not intended to replace advice given to you by your health care provider. Make sure you discuss any questions you have with your health care provider. ?Document Revised:   02/15/2021 Document Reviewed: 06/02/2020 ?Elsevier Patient Education ? [place]. ? ? ?[facility] [phone] call for counseling referral.  ?[place]  [phone] ? ?[facility] daily you can get the medication from [employer].  ?

## 2021-10-05 NOTE — Progress Notes (Signed)
?Industrial/product designer as a Education administrator for [employer], FNP.,have documented all relevant documentation on the behalf of [name], FNP,as directed by  [name], FNP while in the presence of [name], [place]. ? ?This visit occurred during the SARS-CoV-2 public health emergency.  Safety protocols were in place, including screening questions prior to the visit, additional usage of staff PPE, and extensive cleaning of exam room while observing appropriate contact time as indicated for disinfecting solutions. ? ?Subjective:  ?  ? Patient ID: [place] , female    DOB: 26-Jun-1991 , 31 y.o.   MRN: [id number] ? ? ?Chief Complaint  ?Patient presents with  ? Fatigue  ? ? ?HPI ? ?Patient presents today for fatigue.  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 She is having severe fatigue.  She has a one and two year old who is autistic.  She has an appt with Rheumatology in [place] in May - she wanted to see a different provider.  ?She is no longer taking Plaquenil - does admit to her vision changing. She did not make her eye appt ?She has seen GI in the past for pain in her stomach. She has had a colonoscopy - found increased mucous. She did not follow back up with GI.  ? ?She is also having an increased amount of dizziness, she has taken meclizine with minimal relief. She feels like she can not live a normal life.  ?  ? ?Past Medical History:  ?Diagnosis Date  ? Angio-edema   ? Anxiety 2012  ? Asthma   ? Urticaria   ?  ? ?Family History  ?Problem Relation Age of Onset  ? Lupus Mother   ? Asthma Mother   ? Urticaria Father   ? Healthy Father   ? Lupus Maternal Uncle   ? ? ? ?Current Outpatient Medications:  ?  doxycycline (VIBRAMYCIN) 50 MG capsule, Take 1 capsule (50 mg total) by mouth daily., Disp: 90 capsule, Rfl: 1 ?  EPINEPHrine (AUVI-Q) 0.3 mg/0.3 mL IJ SOAJ injection, Inject 0.3 mLs (0.3 mg total) into the muscle as needed for anaphylaxis., Disp: 2 each,  Rfl: 2 ?  hydrOXYzine (ATARAX/VISTARIL) 25 MG tablet, Take by mouth., Disp: , Rfl:  ?  omalizumab (XOLAIR) 150 MG injection, Inject 300 mg into the skin every 28 (twenty-eight) days., Disp: 6 each, Rfl: 11 ?  ondansetron (ZOFRAN) 4 MG tablet, Take 1 tablet (4 mg total) by mouth every 6 (six) hours., Disp: 12 tablet, Rfl: 0 ?  meclizine (ANTIVERT) 12.5 MG tablet, Take 1 tablet (12.5 mg total) by mouth 3 (three) times daily as needed for dizziness., Disp: 30 tablet, Rfl: 0 ?  predniSONE (DELTASONE) 10 MG tablet, Take 1 tablet (10 mg total) by mouth daily as needed. Take 1 tablet by mouth as needed for flare, Disp: 10 tablet, Rfl: 0 ? ?Current Facility-Administered Medications:  ?  omalizumab Arvid Right) injection 300 mg, 300 mg, Subcutaneous, Q28 days, Bobbitt, Sedalia Muta, MD, 300 mg at 10/13/21 0934  ? ?No Known Allergies  ? ?Review of Systems  ?Constitutional:  Positive for fatigue.  ?Respiratory: Negative.    ?Cardiovascular: Negative.   ?Gastrointestinal: Negative.   ?Neurological:  Positive for headaches.   ? ?Today's Vitals  ? 10/05/21 1109  ?BP: 122/70  ?Pulse: 70  ?Temp: 98.5 ?F (36.9 ?C)  ?TempSrc: Oral  ?Weight: 181 lb 9.6 oz (82.4 kg)  ?Height: 5' 4.4" (1.636 m)  ? ?Body mass index  is 30.79 kg/m?.  ? ?Objective:  ?Physical Exam ?Vitals reviewed.  ?Constitutional:   ?   General: She is not in acute distress. ?   Appearance: She is well-developed.  ?Neck:  ?   Trachea: Trachea normal.  ?Cardiovascular:  ?   Rate and Rhythm: Normal rate and regular rhythm.  ?Pulmonary:  ?   Effort: Pulmonary effort is normal. No respiratory distress.  ?   Breath sounds: Normal breath sounds. No wheezing.  ?Musculoskeletal:  ?   Cervical back: Full passive range of motion without pain.  ?Skin: ?   Findings: No rash.  ?Neurological:  ?   Mental Status: She is alert and oriented to person, place, and time.  ?Psychiatric:     ?   Mood and Affect: Mood normal. Mood is not anxious or depressed.     ?   Behavior: Behavior normal.  ?   ? ?   ?Assessment And Plan:  ?   ?1. Fatigue, unspecified type ?Comments: Continues to be persistent, all labs have been within normal limits previously but will recheck metabolic cause.  ?- CBC with Differential/Platelet ?- TSH ? ?2. Vaginal discharge ?Comments: Will check urine cytology ?- POCT Urinalysis Dipstick [id number]) ? ?3. Vertigo ?Comments: Refilled her meclizine, will refer for PT ?- Ambulatory referral to Physical Therapy ? ?4. Hidradenitis ?Comments: Will treat with low dose doxycycline ?- doxycycline (VIBRAMYCIN) 50 MG capsule; Take 1 capsule (50 mg total) by mouth daily.  Dispense: 90 capsule; Refill: 1 ? ?5. Encounter for HIV (human immunodeficiency virus) test ?- HIV Antibody (routine testing w rflx) ?- Vitamin B12 ?  ? ? ?Patient was given opportunity to ask questions. Patient verbalized understanding of the plan and was able to repeat key elements of the plan. All questions were answered to their satisfaction.  ?[name], FNP  ? ?I, [name], FNP, have reviewed all documentation for this visit. The documentation on 10/16/21 for the exam, diagnosis, procedures, and orders are all accurate and complete.  ? ?IF YOU HAVE BEEN REFERRED TO A SPECIALIST, IT MAY TAKE 1-2 WEEKS TO SCHEDULE/PROCESS THE REFERRAL. IF YOU HAVE NOT HEARD FROM US/SPECIALIST IN TWO WEEKS, PLEASE GIVE [place] A CALL AT [phone] X [phone].  ? ?THE PATIENT IS ENCOURAGED TO PRACTICE SOCIAL DISTANCING DUE TO THE COVID-19 PANDEMIC.   ?

## 2021-10-06 ENCOUNTER — Encounter: Payer: Self-pay | Admitting: Nurse Practitioner

## 2021-10-06 ENCOUNTER — Telehealth: Payer: Self-pay

## 2021-10-06 LAB — CBC WITH DIFFERENTIAL/PLATELET
Basophils Absolute: 0 10*3/uL (ref 0.0–0.2)
Basos: 0 %
EOS (ABSOLUTE): 0.1 10*3/uL (ref 0.0–0.4)
Eos: 2 %
Hematocrit: 42.5 % (ref 34.0–46.6)
Hemoglobin: 13.9 g/dL (ref 11.1–15.9)
Immature Grans (Abs): 0 10*3/uL (ref 0.0–0.1)
Immature Granulocytes: 0 %
Lymphocytes Absolute: 2 10*3/uL (ref 0.7–3.1)
Lymphs: 29 %
MCH: 24.3 pg — ABNORMAL LOW (ref 26.6–33.0)
MCHC: 32.7 g/dL (ref 31.5–35.7)
MCV: 74 fL — ABNORMAL LOW (ref 79–97)
Monocytes Absolute: 0.3 10*3/uL (ref 0.1–0.9)
Monocytes: 5 %
Neutrophils Absolute: 4.5 10*3/uL (ref 1.4–7.0)
Neutrophils: 64 %
Platelets: 277 10*3/uL (ref 150–450)
RBC: 5.71 x10E6/uL — ABNORMAL HIGH (ref 3.77–5.28)
RDW: 15.1 % (ref 11.7–15.4)
WBC: 7 10*3/uL (ref 3.4–10.8)

## 2021-10-06 LAB — HIV ANTIBODY (ROUTINE TESTING W REFLEX): HIV Screen 4th Generation wRfx: NONREACTIVE

## 2021-10-06 LAB — TSH: TSH: 1.43 u[IU]/mL (ref 0.450–4.500)

## 2021-10-06 LAB — VITAMIN B12: Vitamin B-12: 758 pg/mL (ref 232–1245)

## 2021-10-06 NOTE — Telephone Encounter (Signed)
Good morning ,  ?I have a few meclizine tablets left, this was a prescription from last year that I just found. I was wondering if you?d be able to prescribe me some more with a few refills on it since it seems like something I?ll be taking for a while.  ? ?Also , I get a xolair injection monthly for Chronic Hives. But the [facility] accidentally scheduled me for the following month so this month I have to go without my injection until my shots are shipped out. I was hoping you could prescribe me a few prednisone?s , because I know im going to need them to get me through the month. I normally take 10 mg as needed for flare ups.  ? ?Lastly, I noticed my red blood count has been pretty high for quite some time. In the past when I was pregnant with my son , I had a ob tell me I had some rare disease that caused my red blood cells to do something ( im not sure ) based on my labs , do you know what that could be ?   ?

## 2021-10-11 ENCOUNTER — Other Ambulatory Visit: Payer: Self-pay | Admitting: Nurse Practitioner

## 2021-10-11 ENCOUNTER — Encounter: Payer: Self-pay | Admitting: Nurse Practitioner

## 2021-10-11 MED ORDER — PREDNISONE 10 MG PO TABS: ORAL_TABLET | ORAL | 0 refills | Status: DC

## 2021-10-11 MED ORDER — MECLIZINE HCL 12.5 MG PO TABS
12.5000 mg | ORAL_TABLET | Freq: Three times a day (TID) | ORAL | 0 refills | Status: DC | PRN
Start: 2021-10-11 — End: 2022-01-29

## 2021-10-12 ENCOUNTER — Other Ambulatory Visit: Payer: Self-pay

## 2021-10-12 MED ORDER — PREDNISONE 10 MG PO TABS: 10.0000 mg | ORAL_TABLET | Freq: Every day | ORAL | 0 refills | Status: DC | PRN

## 2021-10-12 MED ORDER — PREDNISONE 10 MG PO TABS
ORAL_TABLET | ORAL | 0 refills | Status: DC
Start: 2021-10-12 — End: 2021-10-12

## 2021-10-13 ENCOUNTER — Ambulatory Visit (INDEPENDENT_AMBULATORY_CARE_PROVIDER_SITE_OTHER)

## 2021-10-13 ENCOUNTER — Other Ambulatory Visit: Payer: Self-pay

## 2021-10-13 DIAGNOSIS — L501 Idiopathic urticaria: Secondary | ICD-10-CM | POA: Diagnosis not present

## 2021-10-18 ENCOUNTER — Other Ambulatory Visit: Payer: Self-pay

## 2021-10-18 ENCOUNTER — Encounter: Payer: Self-pay | Admitting: Nurse Practitioner

## 2021-10-18 MED ORDER — HYDROXYZINE HCL 25 MG PO TABS: 25.0000 mg | ORAL_TABLET | Freq: Three times a day (TID) | ORAL | 1 refills | Status: DC | PRN

## 2021-11-02 ENCOUNTER — Ambulatory Visit

## 2021-11-15 ENCOUNTER — Ambulatory Visit

## 2021-11-17 ENCOUNTER — Ambulatory Visit

## 2021-11-20 ENCOUNTER — Ambulatory Visit (INDEPENDENT_AMBULATORY_CARE_PROVIDER_SITE_OTHER)

## 2021-11-20 DIAGNOSIS — L501 Idiopathic urticaria: Secondary | ICD-10-CM | POA: Diagnosis not present

## 2021-12-18 ENCOUNTER — Ambulatory Visit

## 2021-12-25 ENCOUNTER — Ambulatory Visit (INDEPENDENT_AMBULATORY_CARE_PROVIDER_SITE_OTHER)

## 2021-12-25 DIAGNOSIS — L501 Idiopathic urticaria: Secondary | ICD-10-CM | POA: Diagnosis not present

## 2022-01-21 ENCOUNTER — Other Ambulatory Visit: Payer: Self-pay | Admitting: Family Medicine

## 2022-01-24 ENCOUNTER — Ambulatory Visit

## 2022-01-24 ENCOUNTER — Ambulatory Visit (INDEPENDENT_AMBULATORY_CARE_PROVIDER_SITE_OTHER)

## 2022-01-24 ENCOUNTER — Ambulatory Visit: Admitting: Nurse Practitioner

## 2022-01-24 DIAGNOSIS — L501 Idiopathic urticaria: Secondary | ICD-10-CM | POA: Diagnosis not present

## 2022-01-29 ENCOUNTER — Encounter: Payer: Self-pay | Admitting: Nurse Practitioner

## 2022-01-29 ENCOUNTER — Ambulatory Visit (INDEPENDENT_AMBULATORY_CARE_PROVIDER_SITE_OTHER): Admitting: Nurse Practitioner

## 2022-01-29 VITALS — BP 120/80 | HR 78 | Temp 98.2°F | Ht 64.4 in | Wt 184.0 lb

## 2022-01-29 DIAGNOSIS — R42 Dizziness and giddiness: Secondary | ICD-10-CM | POA: Diagnosis not present

## 2022-01-29 DIAGNOSIS — G8929 Other chronic pain: Secondary | ICD-10-CM

## 2022-01-29 DIAGNOSIS — E6609 Other obesity due to excess calories: Secondary | ICD-10-CM

## 2022-01-29 DIAGNOSIS — D649 Anemia, unspecified: Secondary | ICD-10-CM

## 2022-01-29 DIAGNOSIS — Z6831 Body mass index (BMI) 31.0-31.9, adult: Secondary | ICD-10-CM

## 2022-01-29 DIAGNOSIS — R519 Headache, unspecified: Secondary | ICD-10-CM

## 2022-01-29 MED ORDER — MECLIZINE HCL 12.5 MG PO TABS: 12.5000 mg | ORAL_TABLET | Freq: Three times a day (TID) | ORAL | 0 refills | Status: DC | PRN

## 2022-01-29 MED ORDER — PREDNISONE 10 MG PO TABS: 10.0000 mg | ORAL_TABLET | Freq: Every day | ORAL | 0 refills | Status: DC | PRN

## 2022-01-29 MED ORDER — PROPRANOLOL HCL 10 MG PO TABS: 10.0000 mg | ORAL_TABLET | Freq: Two times a day (BID) | ORAL | 11 refills | Status: DC

## 2022-01-29 NOTE — Progress Notes (Signed)
I,Tianna Badgett,acting as a Neurosurgeon for [employer], FNP.,have documented all relevant documentation on the behalf of Arnette Felts, FNP,as directed by  Arnette Felts, FNP while in the presence of Arnette Felts, FNP.  Subjective:     Patient ID: Shannon Huerta , female    DOB: July 18, 1991 , 31 y.o.   MRN: [id number]   Chief Complaint  Patient presents with   Hypertension    HPI  Patient presents today for elevated blood pressure readings. She randomly checked her blood pressure 160/110 twice in a week she was feeling "bad, felt like she was about to die". During that time she had a slight headache. She feels a pain to her temple. She has a referral to the Headache clinic but has not had an appt. This occurred 2 weeks ago, she had been under high stress.   She is reporting "a loss of blood pressure in her head". She was referred to the headache institute by rheumatology. She is not taking the prednisone due to losing the prescription. She is taking Xolair injections that has helped with her chronic urticaria. She has been taking prednisone since age 70 years old.      Past Medical History:  Diagnosis Date   Angio-edema    Anxiety 2012   Asthma    Urticaria      Family History  Problem Relation Age of Onset   Lupus Mother    Asthma Mother    Urticaria Father    Healthy Father    Lupus Maternal Uncle      Current Outpatient Medications:    propranolol (INDERAL) 10 MG tablet, Take 1 tablet (10 mg total) by mouth 2 (two) times daily., Disp: 60 tablet, Rfl: 11   EPINEPHrine (AUVI-Q) 0.3 mg/0.3 mL IJ SOAJ injection, Inject 0.3 mLs (0.3 mg total) into the muscle as needed for anaphylaxis., Disp: 2 each, Rfl: 2   hydrOXYzine (ATARAX) 25 MG tablet, Take 1 tablet (25 mg total) by mouth 3 (three) times daily as needed for itching., Disp: 90 tablet, Rfl: 1   meclizine (ANTIVERT) 12.5 MG tablet, Take 1 tablet (12.5 mg total) by mouth 3 (three) times daily as needed for dizziness., Disp: 30  tablet, Rfl: 0   ondansetron (ZOFRAN) 4 MG tablet, Take 1 tablet (4 mg total) by mouth every 6 (six) hours., Disp: 12 tablet, Rfl: 0   predniSONE (DELTASONE) 10 MG tablet, Take 1 tablet (10 mg total) by mouth daily as needed. Take 1 tablet by mouth as needed for flare, Disp: 10 tablet, Rfl: 0   XOLAIR 150 MG injection, INJECT 300 MG UNDER THE SKIN EVERY 28 DAYS, Disp: 2 each, Rfl: 11  Current Facility-Administered Medications:    omalizumab Geoffry Paradise) injection 300 mg, 300 mg, Subcutaneous, Q28 days, Bobbitt, Heywood Iles, MD, 300 mg at 01/24/22 1008   No Known Allergies   Review of Systems  Constitutional: Negative.   Respiratory: Negative.    Cardiovascular: Negative.   Gastrointestinal: Negative.   Neurological: Negative.   Psychiatric/Behavioral: Negative.       Today's Vitals   01/29/22 1117  BP: 128/80  Pulse: 78  Temp: 98.2 F (36.8 C)  TempSrc: Oral  Weight: 184 lb (83.5 kg)  Height: 5' 4.4" (1.636 m)   Body mass index is 31.19 kg/m.  Wt Readings from Last 3 Encounters:  01/29/22 184 lb (83.5 kg)  10/05/21 181 lb 9.6 oz (82.4 kg)  05/04/21 173 lb 3.2 oz (78.6 kg)    Objective:  Physical  Exam Vitals reviewed.  Constitutional:      General: She is not in acute distress.    Appearance: Normal appearance. She is well-developed. She is obese.  Neck:     Trachea: Trachea normal.  Cardiovascular:     Rate and Rhythm: Normal rate and regular rhythm.     Pulses: Normal pulses.     Heart sounds: Normal heart sounds. No murmur heard. Pulmonary:     Effort: Pulmonary effort is normal. No respiratory distress.     Breath sounds: Normal breath sounds. No wheezing.  Musculoskeletal:     Cervical back: Full passive range of motion without pain.  Skin:    Findings: No rash.  Neurological:     General: No focal deficit present.     Mental Status: She is alert and oriented to person, place, and time.     Cranial Nerves: No cranial nerve deficit.     Motor: No weakness.   Psychiatric:        Mood and Affect: Mood normal. Mood is not anxious or depressed.        Behavior: Behavior normal.        Thought Content: Thought content normal.        Judgment: Judgment normal.         Assessment And Plan:     1. Dizziness - MR Brain W Wo Contrast; Future - meclizine (ANTIVERT) 12.5 MG tablet; Take 1 tablet (12.5 mg total) by mouth 3 (three) times daily as needed for dizziness.  Dispense: 30 tablet; Refill: 0 - propranolol (INDERAL) 10 MG tablet; Take 1 tablet (10 mg total) by mouth 2 (two) times daily.  Dispense: 60 tablet; Refill: 11  2. Anemia, unspecified type - Ambulatory referral to Hematology / Oncology  3. Class 1 obesity due to excess calories without serious comorbidity with body mass index (BMI) of 31.0 to 31.9 in adult  4. Chronic nonintractable headache, unspecified headache type - propranolol (INDERAL) 10 MG tablet; Take 1 tablet (10 mg total) by mouth 2 (two) times daily.  Dispense: 60 tablet; Refill: 11 She is encouraged to strive for BMI less than 30 to decrease cardiac risk. Advised to aim for at least 150 minutes of exercise per week.     Patient was given opportunity to ask questions. Patient verbalized understanding of the plan and was able to repeat key elements of the plan. All questions were answered to their satisfaction.  Arnette Felts, FNP   I, Arnette Felts, FNP, have reviewed all documentation for this visit. The documentation on 01/29/22 for the exam, diagnosis, procedures, and orders are all accurate and complete.   IF YOU HAVE BEEN REFERRED TO A SPECIALIST, IT MAY TAKE 1-2 WEEKS TO SCHEDULE/PROCESS THE REFERRAL. IF YOU HAVE NOT HEARD FROM US/SPECIALIST IN TWO WEEKS, PLEASE GIVE [place] A CALL AT [phone].   THE PATIENT IS ENCOURAGED TO PRACTICE SOCIAL DISTANCING DUE TO THE COVID-19 PANDEMIC.

## 2022-02-02 MED ORDER — PROPRANOLOL HCL 10 MG PO TABS: 10.0000 mg | ORAL_TABLET | Freq: Two times a day (BID) | ORAL | 30 refills | Status: DC

## 2022-02-15 ENCOUNTER — Ambulatory Visit: Admitting: Nurse Practitioner

## 2022-02-19 ENCOUNTER — Other Ambulatory Visit: Payer: Self-pay

## 2022-02-19 MED ORDER — HYDROXYZINE HCL 25 MG PO TABS: 25.0000 mg | ORAL_TABLET | Freq: Three times a day (TID) | ORAL | 1 refills | Status: DC | PRN

## 2022-02-21 ENCOUNTER — Ambulatory Visit (INDEPENDENT_AMBULATORY_CARE_PROVIDER_SITE_OTHER)

## 2022-02-21 DIAGNOSIS — L501 Idiopathic urticaria: Secondary | ICD-10-CM

## 2022-03-01 ENCOUNTER — Other Ambulatory Visit: Payer: Self-pay | Admitting: Nurse Practitioner

## 2022-03-01 ENCOUNTER — Ambulatory Visit
Admission: RE | Admit: 2022-03-01 | Discharge: 2022-03-01 | Disposition: A | Discharge status: Home / Self Care | Source: Ambulatory Visit | Attending: Nurse Practitioner | Admitting: Nurse Practitioner

## 2022-03-01 DIAGNOSIS — R42 Dizziness and giddiness: Secondary | ICD-10-CM

## 2022-03-01 DIAGNOSIS — R9089 Other abnormal findings on diagnostic imaging of central nervous system: Secondary | ICD-10-CM

## 2022-03-01 MED ORDER — GADOBENATE DIMEGLUMINE 529 MG/ML IV SOLN
15.0000 mL | Freq: Once | INTRAVENOUS | Status: AC | PRN
  Administered 2022-03-01: 15 mL via INTRAVENOUS

## 2022-03-02 ENCOUNTER — Encounter: Payer: Self-pay | Admitting: Nurse Practitioner

## 2022-03-13 ENCOUNTER — Ambulatory Visit: Admitting: Nurse Practitioner

## 2022-03-20 ENCOUNTER — Encounter: Admitting: Nurse Practitioner

## 2022-03-23 ENCOUNTER — Encounter: Payer: Self-pay | Admitting: Diagnostic Neuroimaging

## 2022-03-23 ENCOUNTER — Ambulatory Visit (INDEPENDENT_AMBULATORY_CARE_PROVIDER_SITE_OTHER): Admitting: Diagnostic Neuroimaging

## 2022-03-23 ENCOUNTER — Ambulatory Visit (INDEPENDENT_AMBULATORY_CARE_PROVIDER_SITE_OTHER): Admitting: *Deleted

## 2022-03-23 VITALS — BP 119/75 | HR 85 | Ht 66.0 in | Wt 186.6 lb

## 2022-03-23 DIAGNOSIS — I639 Cerebral infarction, unspecified: Secondary | ICD-10-CM | POA: Diagnosis not present

## 2022-03-23 DIAGNOSIS — H538 Other visual disturbances: Secondary | ICD-10-CM | POA: Diagnosis not present

## 2022-03-23 DIAGNOSIS — L501 Idiopathic urticaria: Secondary | ICD-10-CM

## 2022-03-23 DIAGNOSIS — G43609 Persistent migraine aura with cerebral infarction, not intractable, without status migrainosus: Secondary | ICD-10-CM

## 2022-03-23 MED ORDER — TOPIRAMATE 50 MG PO TABS: 50.0000 mg | ORAL_TABLET | Freq: Two times a day (BID) | ORAL | 12 refills | Status: DC

## 2022-03-23 MED ORDER — RIZATRIPTAN BENZOATE 10 MG PO TBDP: 10.0000 mg | ORAL_TABLET | ORAL | 6 refills | Status: DC | PRN

## 2022-03-23 NOTE — Patient Instructions (Signed)
  POSITIONAL HEADACHES / BLURRED VISION - refer to ophthalmology; r/o papilledema; IIH eval - mild cerebellar tosillar ectopia; within normal limits; not symptomatic chiari malformation   MIGRAINE PREVENTION  LIFESTYLE CHANGES -Stop or avoid smoking -Decrease or avoid caffeine / alcohol -Eat and sleep on a regular schedule -Exercise several times per week  - start topiramate 50mg  at bedtime; after 1-2 weeks increase to 50mg  twice a day; drink plenty of water  - tried propranolol (no benefit)   MIGRAINE RESCUE  - ibuprofen, tylenol as needed - rizatriptan (Maxalt) 10mg  as needed for breakthrough headache; may repeat x 1 after 2 hours; max 2 tabs per day or 8 per month

## 2022-03-23 NOTE — Progress Notes (Signed)
[facility]  PATIENT: Shannon Huerta DOB: 12-20-1990  REFERRING CLINICIAN: Arnette Felts, FNP HISTORY FROM: patient  REASON FOR VISIT: new consult    HISTORICAL  CHIEF COMPLAINT:  Chief Complaint  Patient presents with   New Patient (Initial Visit)    Rm 7. Alone. NP Internal referral for chiari malformation found on MRI, dizziness, headaches.    HISTORY OF PRESENT ILLNESS:   31 year old female here for evaluation of headaches and abnormal MRI.  For past 2 years patient is having intermittent pressure, throbbing headache and pain sensation in her neck, shoulders, behind her eyes.  Aching pain sensation.  Sensitive to light.  Blurred vision, double vision and nausea.  Symptoms can last hours at a time.  Was having headaches every other day.  Has been using Tylenol on a daily basis for a while but recently stopped due to concern of medication overuse headache.  Having some headaches associated with straining, bending and reaching over.  Had MRI of the brain which showed possible borderline Chiari malformation.  Patient had pregnancies in 2019 and 2020.  Headache started around that time.   REVIEW OF SYSTEMS: Full 14 system review of systems performed and negative with exception of: as per HPI.  ALLERGIES: No Known Allergies  HOME MEDICATIONS: Outpatient Medications Prior to Visit  Medication Sig Dispense Refill   EPINEPHrine (AUVI-Q) 0.3 mg/0.3 mL IJ SOAJ injection Inject 0.3 mLs (0.3 mg total) into the muscle as needed for anaphylaxis. 2 each 2   hydrOXYzine (ATARAX) 25 MG tablet Take 1 tablet (25 mg total) by mouth 3 (three) times daily as needed for itching. 90 tablet 1   meclizine (ANTIVERT) 12.5 MG tablet Take 1 tablet (12.5 mg total) by mouth 3 (three) times daily as needed for dizziness. 30 tablet 0   XOLAIR 150 MG injection INJECT 300 MG UNDER THE SKIN EVERY 28 DAYS 2 each 11   ondansetron (ZOFRAN) 4 MG tablet Take 1 tablet (4 mg total) by mouth  every 6 (six) hours. 12 tablet 0   predniSONE (DELTASONE) 10 MG tablet Take 1 tablet (10 mg total) by mouth daily as needed. Take 1 tablet by mouth as needed for flare 10 tablet 0   propranolol (INDERAL) 10 MG tablet Take 1 tablet (10 mg total) by mouth 2 (two) times daily. 60 tablet 30   Facility-Administered Medications Prior to Visit  Medication Dose Route Frequency Provider Last Rate Last Admin   omalizumab Geoffry Paradise) injection 300 mg  300 mg Subcutaneous Q28 days Bobbitt, Heywood Iles, MD   300 mg at 02/21/22 1111    PAST MEDICAL HISTORY: Past Medical History:  Diagnosis Date   Angio-edema    Anxiety 2012   Asthma    Urticaria     PAST SURGICAL HISTORY: History reviewed. No pertinent surgical history.  FAMILY HISTORY: Family History  Problem Relation Age of Onset   Lupus Mother    Asthma Mother    Urticaria Father    Healthy Father    Lupus Maternal Uncle     SOCIAL HISTORY: Social History   Socioeconomic History   Marital status: Married    Spouse name: Not on file   Number of children: Not on file   Years of education: Not on file   Highest education level: Not on file  Occupational History   Not on file  Tobacco Use   Smoking status: Never    Passive exposure: Past   Smokeless tobacco: Never  Vaping Use  Vaping Use: Never used  Substance and Sexual Activity   Alcohol use: Yes    Comment: SOCIALLY   Drug use: Never   Sexual activity: Not on file  Other Topics Concern   Not on file  Social History Narrative   Not on file   Social Determinants of Health   Financial Resource Strain: Not on file  Food Insecurity: Not on file  Transportation Needs: Not on file  Physical Activity: Not on file  Stress: Not on file  Social Connections: Not on file  Intimate Partner Violence: Not on file     PHYSICAL EXAM  GENERAL EXAM/CONSTITUTIONAL: Vitals:  Vitals:   03/23/22 0835  BP: 119/75  Pulse: 85  Weight: 186 lb 9.6 oz (84.6 kg)  Height: 5\' 6"   (1.676 m)   Body mass index is 30.12 kg/m. Wt Readings from Last 3 Encounters:  03/23/22 186 lb 9.6 oz (84.6 kg)  01/29/22 184 lb (83.5 kg)  10/05/21 181 lb 9.6 oz (82.4 kg)   Patient is in no distress; well developed, nourished and groomed; neck is supple  CARDIOVASCULAR: Examination of carotid arteries is normal; no carotid bruits Regular rate and rhythm, no murmurs Examination of peripheral vascular system by observation and palpation is normal  EYES: Ophthalmoscopic exam of optic discs and posterior segments is normal; no papilledema or hemorrhages No results found.  MUSCULOSKELETAL: Gait, strength, tone, movements noted in Neurologic exam below  NEUROLOGIC: MENTAL STATUS:      No data to display         awake, alert, oriented to person, place and time recent and remote memory intact normal attention and concentration language fluent, comprehension intact, naming intact fund of knowledge appropriate  CRANIAL NERVE:  2nd - no papilledema on fundoscopic exam 2nd, 3rd, 4th, 6th - pupils equal and reactive to light, visual fields full to confrontation, extraocular muscles intact, no nystagmus 5th - facial sensation symmetric 7th - facial strength symmetric 8th - hearing intact 9th - palate elevates symmetrically, uvula midline 11th - shoulder shrug symmetric 12th - tongue protrusion midline  MOTOR:  normal bulk and tone, full strength in the BUE, BLE  SENSORY:  normal and symmetric to light touch, temperature, vibration  COORDINATION:  finger-nose-finger, fine finger movements normal  REFLEXES:  deep tendon reflexes present and symmetric  GAIT/STATION:  narrow based gait     DIAGNOSTIC DATA (LABS, IMAGING, TESTING) - I reviewed patient records, labs, notes, testing and imaging myself where available.  Lab Results  Component Value Date   WBC 7.0 10/05/2021   HGB 13.9 10/05/2021   HCT 42.5 10/05/2021   MCV 74 (L) 10/05/2021   PLT 277 10/05/2021       Component Value Date/Time   NA 137 05/04/2021 1224   K 4.1 05/04/2021 1224   CL 102 05/04/2021 1224   CO2 20 05/04/2021 1224   GLUCOSE 101 (H) 05/04/2021 1224   GLUCOSE 97 03/07/2020 2054   BUN 14 05/04/2021 1224   CREATININE 0.79 05/04/2021 1224   CALCIUM 9.3 05/04/2021 1224   PROT 7.4 03/07/2020 2054   PROT 7.2 01/13/2020 1603   ALBUMIN 4.3 03/07/2020 2054   ALBUMIN 4.4 01/13/2020 1603   AST 16 03/07/2020 2054   ALT 29 03/07/2020 2054   ALKPHOS 69 03/07/2020 2054   BILITOT 0.6 03/07/2020 2054   BILITOT 0.2 01/13/2020 1603   GFRNONAA >60 03/07/2020 2054   GFRAA >60 03/07/2020 2054   No results found for: "CHOL", "HDL", "LDLCALC", "LDLDIRECT", "TRIG", "CHOLHDL"  Lab Results  Component Value Date   HGBA1C 5.3 05/04/2021   Lab Results  Component Value Date   VITAMINB12 758 10/05/2021   Lab Results  Component Value Date   TSH 1.430 10/05/2021    05/16/21 thyroid u/s - Left lower thyroid nodule (labeled 2, 1.4 cm) meets criteria for surveillance, as designated by the newly established ACR TI-RADS criteria. Surveillance ultrasound study recommended to be performed annually up to 5 years.  03/01/22 MRI brain [I reviewed images myself. Minimal cerebellar tonsillar ectopia 3-53mm on right side, on my review. -VRP]  - No evidence of acute intracranial abnormality. - The right cerebellar tonsil extends 4-5 mm below the level of foramen magnum. This is borderline by measurement criteria for a Chiari I malformation. No more than mild crowding at the level of the foramen magnum. - Otherwise unremarkable MRI appearance of the brain.    ASSESSMENT AND PLAN  31 y.o. year old female here with:   Dx:  1. 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 Blurred vision      PLAN:  POSITIONAL HEADACHES / BLURRED VISION - refer to ophthalmology; r/o papilledema; will pursue IIH eval - mild cerebellar  tonsillar ectopia; within normal limits; not symptomatic chiari malformation   MIGRAINE PREVENTION  LIFESTYLE CHANGES -Stop or avoid smoking -Decrease or avoid caffeine / alcohol -Eat and sleep on a regular schedule -Exercise several times per week  - start topiramate 50mg  at bedtime; after 1-2 weeks increase to 50mg  twice a day; drink plenty of water  - tried propranolol (no benefit)   MIGRAINE RESCUE  - ibuprofen, tylenol as needed - rizatriptan (Maxalt) 10mg  as needed for breakthrough headache; may repeat x 1 after 2 hours; max 2 tabs per day or 8 per month  Orders Placed This Encounter  Procedures   Ambulatory referral to Ophthalmology   Meds ordered this encounter  Medications   topiramate (TOPAMAX) 50 MG tablet    Sig: Take 1 tablet (50 mg total) by mouth 2 (two) times daily.    Dispense:  60 tablet    Refill:  12   rizatriptan (MAXALT-MLT) 10 MG disintegrating tablet    Sig: Take 1 tablet (10 mg total) by mouth as needed for migraine. May repeat in 2 hours if needed    Dispense:  8 tablet    Refill:  6   Return in about 6 months (around 09/23/2022) for MyChart visit (15 min).    , MD 03/23/2022, 9:19 AM Certified in Neurology, Neurophysiology and Neuroimaging  [facility] [address] [place], [place] [address] [phone]

## 2022-03-26 ENCOUNTER — Telehealth: Payer: Self-pay | Admitting: Diagnostic Neuroimaging

## 2022-03-26 NOTE — Telephone Encounter (Signed)
[facility] sent [place] a message stating that their first available was not until Dec 1st and the patient needed to be seen some where else sooner.  I sent the referral to Dr. Dione Booze on proficient as urgent.

## 2022-03-26 NOTE — Telephone Encounter (Signed)
Referral sent to [facility] [phone]

## 2022-04-16 ENCOUNTER — Encounter: Payer: Self-pay | Admitting: Nurse Practitioner

## 2022-04-17 ENCOUNTER — Ambulatory Visit
Admission: RE | Admit: 2022-04-17 | Discharge: 2022-04-17 | Disposition: A | Discharge status: Home / Self Care | Source: Ambulatory Visit | Attending: Urgent Care | Admitting: Urgent Care

## 2022-04-17 VITALS — BP 124/86 | HR 88 | Temp 99.0°F | Resp 16

## 2022-04-17 DIAGNOSIS — L500 Allergic urticaria: Secondary | ICD-10-CM

## 2022-04-17 HISTORY — DX: Systemic involvement of connective tissue, unspecified: M35.9

## 2022-04-17 MED ORDER — PREDNISONE 10 MG PO TABS: 20.0000 mg | ORAL_TABLET | Freq: Every day | ORAL | 0 refills | Status: DC

## 2022-04-17 NOTE — ED Provider Notes (Addendum)
Shannon Huerta    CSN: [id number] Arrival date & time: 04/17/22  1521      History   Chief Complaint Chief Complaint  Patient presents with   Rash   APPT 1530    HPI Shannon Huerta is a 31 y.o. female.    Rash   Patient presents to urgent care with report of hives on her body with associated itchiness.  She reports this as a chronic issue and is seen by allergy.  She takes Xolair injections approximately every month and states this generally controls her symptoms.  She has an injection scheduled at a visit in 2 days.  Here to request prescription for prednisone which generally helps to resolve her issue.  She had a prednisone pill leftover from a previous prescription which gave her some relief but not complete.  Past Medical History:  Diagnosis Date   Angio-edema    Anxiety 2012   Asthma    Connective tissue disease (HCC)    Urticaria     Patient Active Problem List   Diagnosis Date Noted   Chronic fatigue 10/28/2020   Recurrent urticaria 01/05/2020   Angioedema 01/05/2020   Allergic rhinitis 01/05/2020   Mild intermittent asthma 01/05/2020   Chronic idiopathic constipation 10/08/2016    History reviewed. No pertinent surgical history.  OB History   No obstetric history on file.      Home Medications    Prior to Admission medications   Medication Sig Start Date End Date Taking? Authorizing Provider  hydrOXYzine (ATARAX) 25 MG tablet Take 1 tablet (25 mg total) by mouth 3 (three) times daily as needed for itching. 02/19/22  Yes Arnette Felts, FNP  EPINEPHrine (AUVI-Q) 0.3 mg/0.3 mL IJ SOAJ injection Inject 0.3 mLs (0.3 mg total) into the muscle as needed for anaphylaxis. 01/05/20   Bobbitt, Heywood Iles, MD  meclizine (ANTIVERT) 12.5 MG tablet Take 1 tablet (12.5 mg total) by mouth 3 (three) times daily as needed for dizziness. 01/29/22   Arnette Felts, FNP  rizatriptan (MAXALT-MLT) 10 MG disintegrating tablet Take 1 tablet (10 mg total) by mouth as  needed for migraine. May repeat in 2 hours if needed 03/23/22   Penumalli, Glenford Bayley, MD  topiramate (TOPAMAX) 50 MG tablet Take 1 tablet (50 mg total) by mouth 2 (two) times daily. 03/23/22   Penumalli, Glenford Bayley, MD  XOLAIR 150 MG injection INJECT 300 MG UNDER THE SKIN EVERY 28 DAYS 01/24/22   Ambs, Norvel Richards, FNP  azelastine (ASTELIN) 0.1 % nasal spray Place 1-2 sprays into both nostrils 2 (two) times daily as needed for rhinitis. 01/05/20 03/02/20  Bobbitt, Heywood Iles, MD    Family History Family History  Problem Relation Age of Onset   Lupus Mother    Asthma Mother    Urticaria Father    Healthy Father    Lupus Maternal Uncle     Social History Social History   Tobacco Use   Smoking status: Never    Passive exposure: Past   Smokeless tobacco: Never  Vaping Use   Vaping Use: Never used  Substance Use Topics   Alcohol use: Yes    Comment: SOCIALLY   Drug use: Never     Allergies   Patient has no known allergies.   Review of Systems Review of Systems   Physical Exam Triage Vital Signs ED Triage Vitals  Enc Vitals Group     BP 04/17/22 1547 124/86     Pulse Rate 04/17/22 1547 88  Resp 04/17/22 1547 16     Temp 04/17/22 1547 99 F (37.2 C)     Temp Source 04/17/22 1547 Oral     SpO2 04/17/22 1547 99 %     Weight --      Height --      Head Circumference --      Peak Flow --      Pain Score 04/17/22 1549 0     Pain Loc --      Pain Edu? --      Excl. in GC? --    No data found.  Updated Vital Signs BP 124/86 (BP Location: Right Arm)   Pulse 88   Temp 99 F (37.2 C) (Oral)   Resp 16   LMP 04/06/2022 (Exact Date)   SpO2 99%   Visual Acuity Right Eye Distance:   Left Eye Distance:   Bilateral Distance:    Right Eye Near:   Left Eye Near:    Bilateral Near:     Physical Exam Vitals reviewed.  Constitutional:      Appearance: Normal appearance.  Skin:    General: Skin is warm.     Findings: Rash present. Rash is urticarial.     Comments:  Hives present on upper and lower extremities as well as her torso.  Neurological:     General: No focal deficit present.     Mental Status: She is alert and oriented to person, place, and time.  Psychiatric:        Mood and Affect: Mood normal.        Behavior: Behavior normal.      UC Treatments / Results  Labs (all labs ordered are listed, but only abnormal results are displayed) Labs Reviewed - No data to display  EKG   Radiology No results found.  Procedures Procedures (including critical care time)  Medications Ordered in UC Medications - No data to display  Initial Impression / Assessment and Plan / UC Course  I have reviewed the triage vital signs and the nursing notes.  Pertinent labs & imaging results that were available during my care of the patient were reviewed by me and considered in my medical decision making (see chart for details).   5-day prescription of prednisone was prescribed for her symptoms of urticaria.  Lesions are currently observed on her body including upper arms, torso and legs.  Instructed patient to keep her appointment with allergy and follow their directions regarding whether she should continue the prednisone following her injection.   Final Clinical Impressions(s) / UC Diagnoses   Final diagnoses:  None   Discharge Instructions   None    ED Prescriptions   None    PDMP not reviewed this encounter.   Charma Igo, FNP 04/17/22 1558    Charma Igo, FNP 04/17/22 1559

## 2022-04-17 NOTE — Discharge Instructions (Signed)
Follow-up with your primary care provider and/or allergy provider, as discussed.

## 2022-04-17 NOTE — ED Triage Notes (Signed)
Patient c/o generalized rash x 1 week.   Patient endorses itchiness.   Patient endorses taking 1 prednisone pill that was left over from a previous prescription with some relief of symptoms.   Patient has taken Hydroxyzine with some relief of symptoms.   History of Chronic Urticaria.

## 2022-04-19 ENCOUNTER — Telehealth: Payer: Self-pay

## 2022-04-19 ENCOUNTER — Ambulatory Visit (INDEPENDENT_AMBULATORY_CARE_PROVIDER_SITE_OTHER): Admitting: Internal Medicine

## 2022-04-19 ENCOUNTER — Encounter: Payer: Self-pay | Admitting: Internal Medicine

## 2022-04-19 ENCOUNTER — Ambulatory Visit

## 2022-04-19 VITALS — BP 122/76 | HR 88 | Temp 98.6°F | Resp 20 | Wt 183.8 lb

## 2022-04-19 DIAGNOSIS — J3089 Other allergic rhinitis: Secondary | ICD-10-CM | POA: Diagnosis not present

## 2022-04-19 DIAGNOSIS — J4520 Mild intermittent asthma, uncomplicated: Secondary | ICD-10-CM | POA: Diagnosis not present

## 2022-04-19 DIAGNOSIS — L500 Allergic urticaria: Secondary | ICD-10-CM | POA: Diagnosis not present

## 2022-04-19 DIAGNOSIS — L508 Other urticaria: Secondary | ICD-10-CM

## 2022-04-19 MED ORDER — HYDROXYZINE HCL 25 MG PO TABS: 25.0000 mg | ORAL_TABLET | Freq: Every evening | ORAL | 1 refills | Status: DC | PRN

## 2022-04-19 MED ORDER — LEVOCETIRIZINE DIHYDROCHLORIDE 5 MG PO TABS: ORAL_TABLET | ORAL | 5 refills | Status: DC

## 2022-04-19 MED ORDER — PREDNISONE 20 MG PO TABS
40.0000 mg | ORAL_TABLET | Freq: Every day | ORAL | 0 refills | Status: AC
Start: 2022-04-19 — End: 2022-04-23

## 2022-04-19 MED ORDER — EPINEPHRINE 0.3 MG/0.3ML IJ SOAJ: 0.3000 mg | INTRAMUSCULAR | 2 refills | Status: DC | PRN

## 2022-04-19 MED ORDER — AZELASTINE HCL 0.1 % NA SOLN: 1.0000 | Freq: Two times a day (BID) | NASAL | 5 refills | Status: DC | PRN

## 2022-04-19 MED ORDER — ALBUTEROL SULFATE HFA 108 (90 BASE) MCG/ACT IN AERS: 2.0000 | INHALATION_SPRAY | Freq: Four times a day (QID) | RESPIRATORY_TRACT | 2 refills | Status: DC | PRN

## 2022-04-19 NOTE — Telephone Encounter (Signed)
Called and spoke to patient she needs to only take one pill out of prednisone baby pack today the rest will be called in to pharmacy she requires a higher strength than what we have in office. She understands and has no other questions or concerns at this time.

## 2022-04-19 NOTE — Patient Instructions (Addendum)
Chronic Autoimmune urticaria with current flare A prescription has been provided for levocetirizine (Xyzal), 5 mg daily as needed.  Can increase to 10 mg twice daily as maximum dose for breakthrough hives Hydroxyzine as needed at night, try to limit use of this medication A prescription has been provided for epinephrine 0.3 mg autoinjector Continue Xolair-will submit to see if we can try for every 3 week dosing due to breakthrough hives For current episode: prednisone 40 mg x 5 days  Angioedema Associated angioedema occurs in up to 50% of patients with chronic urticaria. Treatment/diagnostic plan as outlined above.  Allergic rhinitis Aeroallergen avoidance measures have been discussed and provided in written form. Levocetirizine has been prescribed (as above). A prescription has been provided for azelastine nasal spray, 1-2 sprays per nostril 2 times daily if needed. Proper nasal spray technique has been discussed and demonstrated.  Nasal saline spray (i.e., Simply Saline) or nasal saline lavage (i.e., NeilMed) is recommended as needed and prior to medicated nasal sprays.  Mild intermittent asthma Continue to have access to albuterol HFA, 1 to 2 inhalations every 4-6 hours if needed.   Follow-up in 6 months, sooner if needed.  It was wonderful meeting you today! Thank you for letting me participate in your care. Tonny Bollman, MD [facility]

## 2022-04-19 NOTE — Progress Notes (Signed)
FOLLOW UP Date of Service/Encounter:  04/19/22   Subjective:  Shannon Huerta (DOB: 03-28-1991) is a 31 y.o. female PMHx of mixed connective tissue disease who returns to the [facility] on 04/19/2022 in re-evaluation of the following: Chronic autoimmune urticaria, intermittent asthma History obtained from: chart review and patient.  For Review, LV was on 01/05/20  with Dr. Verlin Fester seen for intial visit for chronic urticaria with angioedema, intermittent asthma . Started on xolair 300 mg q 4 weeks on 02/16/20.   Pertinent history/diagnostics: Patient with a history of urticaria plus angioedema (has had lip and tongue swelling, eyelid swelling) since she was around 31 years old. She has been diagnosed with mixed connective tissue disease. Initial hive labs showed an elevated CU index ( 35), normal CMP, normal baseline tryptase (10.1), negative alpha gal panel, positive ANA normal ESR, Interim hx: seen in [facility] On 04/17/22 for breakthrough hives requiring prednisone-provided 5 day Rx.  2021 allergy testing:  Environmental skin testing: Positive to grass pollen, mold, and dust mite antigen.   Food allergen skin testing: Negative despite a positive histamine control.  Today presents for follow-up. Patient has been receiving Xolair injections every month since her initial visit with Dr. Verlin Fester in 2021. On week 3 following her injections, she will start to notice that she is due as she tends to develop itchy rash. Recently, she had breakthrough hives and she was given prednisone 20 mg x 5 days, which gave her slight relief from itchiness for around 5 hours.  This was prescribed yesterday.  However, she does not have complete relief.  She did take 30 mg this morning, but feels that her hives are returning.  She would like some quick relief. She has two small children, youngest is 65 years old. She does have some increased congestion which started before the hives started. She has been  dealing with urticaria since she was 31 years old. She does take hydroxyzine every night.  She will wake up and feel hives/itchy if she doesn't take this daily. She has been taking this for the past 5 years. She does not use her rescue inhaler, but does feel slight SOB since hives started.  She does not have an up-to-date EpiPen. She did receive her Xolair injection today in clinic.  Allergies as of 04/19/2022   No Known Allergies      Medication List        Accurate as of April 19, 2022 11:18 AM. If you have any questions, ask your nurse or doctor.          EPINEPHrine 0.3 mg/0.3 mL Soaj injection Commonly known as: Auvi-Q Inject 0.3 mLs (0.3 mg total) into the muscle as needed for anaphylaxis.   hydrOXYzine 25 MG tablet Commonly known as: ATARAX Take 1 tablet (25 mg total) by mouth 3 (three) times daily as needed for itching.   meclizine 12.5 MG tablet Commonly known as: ANTIVERT Take 1 tablet (12.5 mg total) by mouth 3 (three) times daily as needed for dizziness.   predniSONE 10 MG tablet Commonly known as: DELTASONE Take 2 tablets (20 mg total) by mouth daily for 5 days.   rizatriptan 10 MG disintegrating tablet Commonly known as: MAXALT-MLT Take 1 tablet (10 mg total) by mouth as needed for migraine. May repeat in 2 hours if needed   topiramate 50 MG tablet Commonly known as: Topamax Take 1 tablet (50 mg total) by mouth 2 (two) times daily.   Xolair 150 MG injection  Generic drug: omalizumab INJECT 300 MG UNDER THE SKIN EVERY 28 DAYS       Past Medical History:  Diagnosis Date   Angio-edema    Anxiety 2012   Asthma    Connective tissue disease ([facility])    Urticaria    No past surgical history on file. Otherwise, there have been no changes to her past medical history, surgical history, family history, or social history.  ROS: All others negative except as noted per HPI.   Objective:  BP 122/76 (BP Location: Left Arm, Patient Position: Sitting, Cuff  Size: Normal)   Pulse 88   Temp 98.6 F (37 C) (Temporal)   Resp 20   Wt 183 lb 12.8 oz (83.4 kg)   LMP 04/06/2022 (Exact Date)   SpO2 99%   BMI 29.67 kg/m  Body mass index is 29.67 kg/m. Physical Exam: General Appearance:  Alert, cooperative, no distress, appears stated age  Head:  Normocephalic, without obvious abnormality, atraumatic  Eyes:  Conjunctiva clear, EOM's intact  Nose: Nares normal, hypertrophic turbinates, normal mucosa, no visible anterior polyps, and septum midline  Throat: Lips, tongue normal; teeth and gums normal, normal posterior oropharynx  Neck: Supple, symmetrical  Lungs:   clear to auscultation bilaterally, Respirations unlabored, no coughing  Heart:  regular rate and rhythm and no murmur, Appears well perfused  Extremities: No edema  Skin: Skin color, texture, turgor normal, excoriations noted on chest and upper extremities, no overt urticarial lesions on exposed areas of skin  Neurologic: No gross deficits  Spirometry:  Tracings reviewed. Her effort: Good reproducible efforts. FVC: 3.35L FEV1: 2.99L, 87% predicted FEV1/FVC ratio: 105% Interpretation: Spirometry consistent with normal pattern.  Please see scanned spirometry results for details.  Assessment/Plan   Chronic Autoimmune urticaria with current flare A prescription has been provided for levocetirizine (Xyzal), 5 mg daily as needed.  Can increase to 10 mg twice daily as maximum dose for breakthrough hives Hydroxyzine as needed at night, try to limit use of this medication A prescription has been provided for epinephrine 0.3 mg autoinjector Continue Xolair-will submit to see if we can try for every 3 week dosing due to breakthrough hives For current episode: prednisone 40 mg x 5 days  Angioedema- Associated angioedema occurs in up to 50% of patients with chronic urticaria. Treatment/diagnostic plan as outlined above.  Allergic rhinitis-controlled Aeroallergen avoidance measures have been  discussed and provided in written form. Levocetirizine has been prescribed (as above). Azelastine nasal spray, 1-2 sprays per nostril 2 times daily if needed.  Nasal saline spray (i.e., Simply Saline) or nasal saline lavage (i.e., NeilMed) is recommended as needed and prior to medicated nasal sprays.  Mild intermittent asthma-controlled Continue to have access to albuterol HFA, 1 to 2 inhalations every 4-6 hours if needed. Spirometry look great today   Follow-up in 6 months, sooner if needed.  It was wonderful meeting you today! Thank you for letting me participate in your care.  Sigurd Sos, MD  [facility]

## 2022-04-20 ENCOUNTER — Ambulatory Visit

## 2022-04-23 ENCOUNTER — Ambulatory Visit: Admitting: Internal Medicine

## 2022-04-25 ENCOUNTER — Telehealth (INDEPENDENT_AMBULATORY_CARE_PROVIDER_SITE_OTHER): Admitting: Nurse Practitioner

## 2022-04-25 ENCOUNTER — Encounter: Payer: Self-pay | Admitting: Nurse Practitioner

## 2022-04-25 DIAGNOSIS — R519 Headache, unspecified: Secondary | ICD-10-CM | POA: Diagnosis not present

## 2022-04-25 DIAGNOSIS — L732 Hidradenitis suppurativa: Secondary | ICD-10-CM

## 2022-04-25 DIAGNOSIS — G8929 Other chronic pain: Secondary | ICD-10-CM

## 2022-04-25 DIAGNOSIS — R9089 Other abnormal findings on diagnostic imaging of central nervous system: Secondary | ICD-10-CM | POA: Diagnosis not present

## 2022-04-25 NOTE — Patient Instructions (Signed)
Migraine Headache ?A migraine headache is a very strong throbbing pain on one side or both sides of your head. This type of headache can also cause other symptoms. It can last from 4 hours to 3 days. Talk with your doctor about what things may bring on (trigger) this condition. ?What are the causes? ?The exact cause of this condition is not known. This condition may be triggered or caused by: ?Drinking alcohol. ?Smoking. ?Taking medicines, such as: ?Medicine used to treat chest pain (nitroglycerin). ?Birth control pills. ?Estrogen. ?Some blood pressure medicines. ?Eating or drinking certain products. ?Doing physical activity. ?Other things that may trigger a migraine headache include: ?Having a menstrual period. ?Pregnancy. ?Hunger. ?Stress. ?Not getting enough sleep or getting too much sleep. ?Weather changes. ?Tiredness (fatigue). ?What increases the risk? ?Being 25-55 years old. ?Being female. ?Having a family history of migraine headaches. ?Being Caucasian. ?Having depression or anxiety. ?Being very overweight. ?What are the signs or symptoms? ?A throbbing pain. This pain may: ?Happen in any area of the head, such as on one side or both sides. ?Make it hard to do daily activities. ?Get worse with physical activity. ?Get worse around bright lights or loud noises. ?Other symptoms may include: ?Feeling sick to your stomach (nauseous). ?Vomiting. ?Dizziness. ?Being sensitive to bright lights, loud noises, or smells. ?Before you get a migraine headache, you may get warning signs (an aura). An aura may include: ?Seeing flashing lights or having blind spots. ?Seeing bright spots, halos, or zigzag lines. ?Having tunnel vision or blurred vision. ?Having numbness or a tingling feeling. ?Having trouble talking. ?Having weak muscles. ?Some people have symptoms after a migraine headache (postdromal phase), such as: ?Tiredness. ?Trouble thinking (concentrating). ?How is this treated? ?Taking medicines that: ?Relieve  pain. ?Relieve the feeling of being sick to your stomach. ?Prevent migraine headaches. ?Treatment may also include: ?Having acupuncture. ?Avoiding foods that bring on migraine headaches. ?Learning ways to control your body functions (biofeedback). ?Therapy to help you know and deal with negative thoughts (cognitive behavioral therapy). ?Follow these instructions at home: ?Medicines ?Take over-the-counter and prescription medicines only as told by your doctor. ?Ask your doctor if the medicine prescribed to you: ?Requires you to avoid driving or using heavy machinery. ?Can cause trouble pooping (constipation). You may need to take these steps to prevent or treat trouble pooping: ?Drink enough fluid to keep your pee (urine) pale yellow. ?Take over-the-counter or prescription medicines. ?Eat foods that are high in fiber. These include beans, whole grains, and fresh fruits and vegetables. ?Limit foods that are high in fat and sugar. These include fried or sweet foods. ?Lifestyle ?Do not drink alcohol. ?Do not use any products that contain nicotine or tobacco, such as cigarettes, e-cigarettes, and chewing tobacco. If you need help quitting, ask your doctor. ?Get at least 8 hours of sleep every night. ?Limit and deal with stress. ?General instructions ? ?  ? ?Keep a journal to find out what may bring on your migraine headaches. For example, write down: ?What you eat and drink. ?How much sleep you get. ?Any change in what you eat or drink. ?Any change in your medicines. ?If you have a migraine headache: ?Avoid things that make your symptoms worse, such as bright lights. ?It may help to lie down in a dark, quiet room. ?Do not drive or use heavy machinery. ?Ask your doctor what activities are safe for you. ?Keep all follow-up visits as told by your doctor. This is important. ?Contact a doctor if: ?You get a migraine   headache that is different or worse than others you have had. ?You have more than 15 headache days in one  month. ?Get help right away if: ?Your migraine headache gets very bad. ?Your migraine headache lasts longer than 72 hours. ?You have a fever. ?You have a stiff neck. ?You have trouble seeing. ?Your muscles feel weak or like you cannot control them. ?You start to lose your balance a lot. ?You start to have trouble walking. ?You pass out (faint). ?You have a seizure. ?Summary ?A migraine headache is a very strong throbbing pain on one side or both sides of your head. These headaches can also cause other symptoms. ?This condition may be treated with medicines and changes to your lifestyle. ?Keep a journal to find out what may bring on your migraine headaches. ?Contact a doctor if you get a migraine headache that is different or worse than others you have had. ?Contact your doctor if you have more than 15 headache days in a month. ?This information is not intended to replace advice given to you by your health care provider. Make sure you discuss any questions you have with your health care provider. ?Document Revised: 11/14/2018 Document Reviewed: 09/04/2018 ?Elsevier Patient Education ? 2023 Else[employer] Inc. ? ?

## 2022-04-25 NOTE — Progress Notes (Signed)
Virtual Visit via MyChart   This visit type was conducted due to national recommendations for restrictions regarding the COVID-19 Pandemic (e.g. social distancing) in an effort to limit this patient's exposure and mitigate transmission in our community.  Due to her co-morbid illnesses, this patient is at least at moderate risk for complications without adequate follow up.  This format is felt to be most appropriate for this patient at this time.  All issues noted in this document were discussed and addressed.  A limited physical exam was performed with this format.    This visit type was conducted due to national recommendations for restrictions regarding the COVID-19 Pandemic (e.g. social distancing) in an effort to limit this patient's exposure and mitigate transmission in our community.  Patients identity confirmed using two different identifiers.  This format is felt to be most appropriate for this patient at this time.  All issues noted in this document were discussed and addressed.  No physical exam was performed (except for noted visual exam findings with Video Visits).    Date:  04/29/2022   ID:  Shannon Huerta, DOB March 07, 1991, MRN [id number]  Patient Location:  Home - spoke with [name]  Provider location:   Office    Chief Complaint:  Referrals  History of Present Illness:    Shannon Huerta is a 31 y.o. female who presents via video conferencing for a telehealth visit today.    The patient does not have symptoms concerning for COVID-19 infection (fever, chills, cough, or new shortness of breath).   Virtual visit at the request of the patient to discuss MRI results and headaches.   She would like to have a second opinion for her headaches and abnormal MRI.   She is taking doxycycline for her supprativa hydradenitis      Past Medical History:  Diagnosis Date   Angio-edema    Anxiety 2012   Asthma    Connective tissue disease (HCC)    Urticaria    History reviewed.  No pertinent surgical history.   No outpatient medications have been marked as taking for the 04/25/22 encounter (Video Visit) with Arnette Felts, FNP.   Current Facility-Administered Medications for the 04/25/22 encounter (Video Visit) with Arnette Felts, FNP  Medication   omalizumab Geoffry Paradise) injection 300 mg     Allergies:   Patient has no known allergies.   Social History   Tobacco Use   Smoking status: Never    Passive exposure: Past   Smokeless tobacco: Never  Vaping Use   Vaping Use: Never used  Substance Use Topics   Alcohol use: Yes    Comment: SOCIALLY   Drug use: Never     Family Hx: The patient's family history includes Asthma in her mother; Healthy in her father; Lupus in her maternal uncle and mother; Urticaria in her father.  ROS:   Please see the history of present illness.    Review of Systems  Constitutional: Negative.   Respiratory: Negative.    Cardiovascular: Negative.   Gastrointestinal: Negative.   Genitourinary: Negative.   Musculoskeletal: Negative.   Skin:  Positive for rash.  Neurological:  Positive for headaches.  Psychiatric/Behavioral: Negative.      All other systems reviewed and are negative.   Labs/Other Tests and Data Reviewed:    Recent Labs: 05/04/2021: BUN 14; Creatinine, Ser 0.79; Potassium 4.1; Sodium 137 10/05/2021: Hemoglobin 13.9; Platelets 277; TSH 1.430   Recent Lipid Panel No results found for: "CHOL", "TRIG", "HDL", "CHOLHDL", "  Beecher", "LDLDIRECT"  Wt Readings from Last 3 Encounters:  04/19/22 183 lb 12.8 oz (83.4 kg)  03/23/22 186 lb 9.6 oz (84.6 kg)  01/29/22 184 lb (83.5 kg)     Exam:    Vital Signs:  LMP 04/06/2022 (Exact Date)     Physical Exam Vitals reviewed.  Constitutional:      General: She is not in acute distress.    Appearance: Normal appearance.  Pulmonary:     Effort: Pulmonary effort is normal. No respiratory distress.  Neurological:     General: No focal deficit present.     Mental  Status: She is alert and oriented to person, place, and time. Mental status is at baseline.     Cranial Nerves: No cranial nerve deficit.  Psychiatric:        Mood and Affect: Mood and affect normal.        Behavior: Behavior normal.        Thought Content: Thought content normal.        Cognition and Memory: Memory normal.        Judgment: Judgment normal.     ASSESSMENT & PLAN:    1. Chronic nonintractable headache, unspecified headache type She was seen at [facility] would like a second opinion, has not started the medications. - Ambulatory referral to Neurology  2. 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 - Ambulatory referral to Neurology  3. Hidradenitis She does not feel the Doxycycline is helping and would like a referral to Dermatology in [facility] - Ambulatory referral to Dermatology   COVID-19 Education: The signs and symptoms of COVID-19 were discussed with the patient and how to seek care for testing (follow up with PCP or arrange E-visit).  The importance of social distancing was discussed today.  Patient Risk:   After full review of this patients clinical status, I feel that they are at least moderate risk at this time.  Time:   Today, I have spent 8 minutes/ seconds with the patient with telehealth technology discussing above diagnoses.     Medication Adjustments/Labs and Tests Ordered: Current medicines are reviewed at length with the patient today.  Concerns regarding medicines are outlined above.   Tests Ordered: Orders Placed This Encounter  Procedures   Ambulatory referral to Neurology   Ambulatory referral to Dermatology    Medication Changes: No orders of the defined types were placed in this encounter.   Disposition:  Follow up prn  Signed, [name], FNP

## 2022-04-27 ENCOUNTER — Encounter: Payer: Self-pay | Admitting: Internal Medicine

## 2022-04-27 ENCOUNTER — Other Ambulatory Visit: Payer: Self-pay | Admitting: *Deleted

## 2022-04-27 MED ORDER — PREDNISONE 20 MG PO TABS: 40.0000 mg | ORAL_TABLET | Freq: Every day | ORAL | 0 refills | Status: AC

## 2022-05-01 ENCOUNTER — Other Ambulatory Visit: Payer: Self-pay | Admitting: *Deleted

## 2022-05-01 MED ORDER — XOLAIR 150 MG ~~LOC~~ SOLR: 300.0000 mg | SUBCUTANEOUS | 11 refills | Status: DC

## 2022-05-02 NOTE — Telephone Encounter (Signed)
Dupixent is not approved for hives, so we wouldn't be able to start you on that.  It is possible that it will be approved for hives later this year, but until it is, your insurance is going to deny it.  We are going to be doing the Xolair every 2 weeks which is what some individuals need for their hives.

## 2022-05-07 ENCOUNTER — Telehealth: Payer: Self-pay | Admitting: *Deleted

## 2022-05-07 ENCOUNTER — Encounter: Payer: Self-pay | Admitting: Internal Medicine

## 2022-05-07 ENCOUNTER — Telehealth: Admitting: Nurse Practitioner

## 2022-05-07 NOTE — Telephone Encounter (Signed)
-----   Message from Clemon Chambers, MD sent at 04/19/2022  1:09 PM EDT ----- Shannon Huerta - Can we see about getting her Xolair every 3 weeks? She is starting to have breakthrough symptoms. Thanks!

## 2022-05-07 NOTE — Telephone Encounter (Signed)
Called patient and advise new rx to increase to every two weeks for Xolair with next dose

## 2022-05-08 ENCOUNTER — Other Ambulatory Visit: Payer: Self-pay | Admitting: Internal Medicine

## 2022-05-08 MED ORDER — PREDNISONE 10 MG PO TABS: ORAL_TABLET | ORAL | 0 refills | Status: DC

## 2022-05-10 ENCOUNTER — Encounter: Payer: Self-pay | Admitting: Internal Medicine

## 2022-05-10 NOTE — Telephone Encounter (Signed)
Yes please put her on my schedule preferably or Evelyn's if there is no spots.  Thank you

## 2022-05-13 NOTE — Progress Notes (Unsigned)
FOLLOW UP Date of Service/Encounter:  05/14/22   Subjective:  Shannon Huerta (DOB: [birth year]) is a 31 y.o. female who returns to the [facility] on 05/14/2022 in re-evaluation of the following: acute visit for hives flare History obtained from: chart review and patient and spouse .  For Review, LV was on 04/19/22  with Dr.Lateisha Thurlow seen for routine follow-up.  Receiving Xolair injections monthly since 2021.  Having breakthrough symptoms by week 3 following injections.  Her intermittent asthma was controlled with an FEV1 of 87%.  Interim history: Has required multiple rounds of oral prednisone to provide relief from urticaria.  Xolair has been increased to every 2-week dosing which we will start at today's visit.  She will receive Xolair today.  ----------  Today presents for follow-up. She is having angioedema of her eyelids and hives daily.  She is taking 1-2 x 10 mg prednisone every day to keep her hives under control.  This does not completely resolve her hives but makes him tolerable. She is getting somewhat depressed from taking all the prednisone, and would like some relief from something else.  However, she is also not sleeping because of her hives and her anxiety is elevated due to hives and prednisone.  Her significant other is with her today, and also notes changes to her behavior while on the prednisone.  She is due for her Xolair injection today.  She is only taking 1 Xyzal in the morning and 1 hydroxyzine at night.  She is taking Pepcid twice a day.  She has been on Singulair in the past, but has not taken this in a long time.  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 She has been diagnosed with mixed connective tissue disease. Initial hive labs showed an elevated CU index ( 35), normal CMP, normal  baseline tryptase (10.1), negative alpha gal panel, positive ANA normal ESR, Interim hx: seen in [facility] On 04/17/22 for breakthrough hives requiring prednisone-provided 5 day Rx.  2021 allergy testing:  Environmental skin testing: Positive to grass pollen, mold, and dust mite antigen.   Food allergen skin testing: Negative despite a positive histamine control. ----  Allergies as of 05/14/2022   No Known Allergies      Medication List        Accurate as of May 14, 2022  1:29 PM. If you have any questions, ask your nurse or doctor.          albuterol 108 (90 Base) MCG/ACT inhaler Commonly known as: VENTOLIN HFA Inhale 2 puffs into the lungs every 6 (six) hours as needed for wheezing or shortness of breath.   azelastine 0.1 % nasal spray Commonly known as: ASTELIN Place 1-2 sprays into both nostrils 2 (two) times daily as needed for rhinitis.   EPINEPHrine 0.3 mg/0.3 mL Soaj injection Commonly known as: EpiPen 2-Pak Inject 0.3 mg into the muscle as needed for anaphylaxis.   hydrOXYzine 25 MG tablet Commonly known as: ATARAX Take 25 mg by mouth daily.   levocetirizine 5 MG tablet Commonly known as: XYZAL Take 1 tablet daily, can increase to 2 tablets twice daily as needed for hives.  This is maximum dose.  Do not take with other antihistamines such as hydroxyzine.   meclizine 12.5 MG tablet Commonly known as: ANTIVERT Take 1 tablet (12.5 mg total) by mouth 3 (three) times  daily as needed for dizziness.   montelukast 10 MG tablet Commonly known as: Singulair Take 1 tablet (10 mg total) by mouth at bedtime. Started by: Clemon Chambers, MD   predniSONE 10 MG tablet Commonly known as: DELTASONE Take 2 tablets daily by mouth for 10 days, then 1 tablet daily by mouth for 5 days, then 1/2 tablet by mouth for 5 days then stop. What changed: additional instructions Changed by: Clemon Chambers, MD   Xolair 150 MG injection Generic drug: omalizumab Inject 300 mg into the skin  every 14 (fourteen) days.       Past Medical History:  Diagnosis Date   Angio-edema    Anxiety 2012   Asthma    Connective tissue disease ([place])    Urticaria    History reviewed. No pertinent surgical history. Otherwise, there have been no changes to her past medical history, surgical history, family history, or social history.  ROS: All others negative except as noted per HPI.   Objective:  BP 108/72   Pulse 82   Temp 98.1 F (36.7 C) (Temporal)   Resp 16   LMP 04/06/2022 (Exact Date)   SpO2 99%  There is no height or weight on file to calculate BMI. Physical Exam: General Appearance:  Alert, cooperative, no distress, appears stated age  Head:  Normocephalic, without obvious abnormality, atraumatic  Eyes:  Conjunctiva clear, EOM's intact  Nose: Nares normal, hypertrophic turbinates, normal mucosa, no visible anterior polyps, and septum midline  Throat: Lips, tongue normal; teeth and gums normal, normal posterior oropharynx  Neck: Supple, symmetrical  Lungs:   clear to auscultation bilaterally, Respirations unlabored, no coughing  Heart:  regular rate and rhythm and no murmur, Appears well perfused  Extremities: No edema  Skin: Skin color, texture, turgor normal, multiple urticarial lesions scattered on upper chest and neck overlying excoriations  Neurologic: No gross deficits   Assessment/Plan   Chronic Autoimmune urticaria with current flare Start Xyzal 10 mg (2 tablets) in the morning and then 1 or 2 at night (2 if not taking hydroxyzine) Hydroxyzine as needed at night, try to limit use of this medication-this will replace one xyzal Singulair 10 mg nightly Pepcid 20 mg twice daily Continue Xolair-will start every 2 week dosing; increasing dose. For current episode: start prednisone 20 mg daily for 10 days, then 10 mg daily for 5 days, then 5 mg daily for 5 days.  Angioedema Associated angioedema occurs in up to 50% of patients with chronic  urticaria. Treatment/diagnostic plan as outlined above.  Allergic rhinitis Aeroallergen avoidance measures have been discussed and provided in written form. Levocetirizine has been prescribed (as above). A prescription has been provided for azelastine nasal spray, 1-2 sprays per nostril 2 times daily if needed. Proper nasal spray technique has been discussed and demonstrated.  Nasal saline spray (i.e., Simply Saline) or nasal saline lavage (i.e., NeilMed) is recommended as needed and prior to medicated nasal sprays.  Mild intermittent asthma Continue to have access to albuterol HFA, 1 to 2 inhalations every 4-6 hours if needed.   Follow-up in 3 months, sooner if needed.  It was wonderful meeting you today! Thank you for letting me participate in your care.  Sigurd Sos, MD  [facility]

## 2022-05-14 ENCOUNTER — Ambulatory Visit: Admitting: Internal Medicine

## 2022-05-14 ENCOUNTER — Encounter: Payer: Self-pay | Admitting: Internal Medicine

## 2022-05-14 ENCOUNTER — Ambulatory Visit

## 2022-05-14 VITALS — BP 108/72 | HR 82 | Temp 98.1°F | Resp 16

## 2022-05-14 DIAGNOSIS — J4520 Mild intermittent asthma, uncomplicated: Secondary | ICD-10-CM

## 2022-05-14 DIAGNOSIS — J3089 Other allergic rhinitis: Secondary | ICD-10-CM

## 2022-05-14 DIAGNOSIS — L508 Other urticaria: Secondary | ICD-10-CM

## 2022-05-14 DIAGNOSIS — L500 Allergic urticaria: Secondary | ICD-10-CM | POA: Diagnosis not present

## 2022-05-14 DIAGNOSIS — L501 Idiopathic urticaria: Secondary | ICD-10-CM

## 2022-05-14 MED ORDER — PREDNISONE 10 MG PO TABS: ORAL_TABLET | ORAL | 0 refills | Status: DC

## 2022-05-14 MED ORDER — MONTELUKAST SODIUM 10 MG PO TABS: 10.0000 mg | ORAL_TABLET | Freq: Every day | ORAL | 3 refills | Status: DC

## 2022-05-14 NOTE — Patient Instructions (Addendum)
Chronic Autoimmune urticaria with current flare Start Xyzal 10 mg (2 tablets) in the morning and then 1 or 2 at night (2 if not taking hydroxyzine) Hydroxyzine as needed at night, try to limit use of this medication-this will replace one xyzal Singulair 10 mg nightly Pepcid 20 mg twice daily Continue Xolair-will start every 2 week dosing; increasing dose. For current episode: start prednisone 20 mg daily for 10 days, then 10 mg daily for 5 days, then 5 mg daily for 5 days.  Angioedema Associated angioedema occurs in up to 50% of patients with chronic urticaria. Treatment/diagnostic plan as outlined above.  Allergic rhinitis Aeroallergen avoidance measures have been discussed and provided in written form. Levocetirizine has been prescribed (as above). A prescription has been provided for azelastine nasal spray, 1-2 sprays per nostril 2 times daily if needed. Proper nasal spray technique has been discussed and demonstrated.  Nasal saline spray (i.e., Simply Saline) or nasal saline lavage (i.e., NeilMed) is recommended as needed and prior to medicated nasal sprays.  Mild intermittent asthma Continue to have access to albuterol HFA, 1 to 2 inhalations every 4-6 hours if needed.   Follow-up in 3 months, sooner if needed.  It was wonderful meeting you today! Thank you for letting me participate in your care. Sigurd Sos, MD [facility]

## 2022-05-17 ENCOUNTER — Ambulatory Visit

## 2022-05-28 ENCOUNTER — Ambulatory Visit (INDEPENDENT_AMBULATORY_CARE_PROVIDER_SITE_OTHER)

## 2022-05-28 DIAGNOSIS — L501 Idiopathic urticaria: Secondary | ICD-10-CM | POA: Diagnosis not present

## 2022-05-28 MED ORDER — OMALIZUMAB 150 MG ~~LOC~~ SOLR
300.0000 mg | SUBCUTANEOUS | Status: DC
  Administered 2022-05-28 – 2022-10-25 (×10): 300 mg via SUBCUTANEOUS

## 2022-05-30 ENCOUNTER — Encounter: Payer: Self-pay | Admitting: Internal Medicine

## 2022-05-30 ENCOUNTER — Other Ambulatory Visit: Payer: Self-pay | Admitting: Internal Medicine

## 2022-05-30 MED ORDER — ONDANSETRON HCL 4 MG PO TABS: ORAL_TABLET | ORAL | 0 refills | Status: DC

## 2022-05-30 MED ORDER — PREDNISONE 10 MG PO TABS: ORAL_TABLET | ORAL | 0 refills | Status: DC

## 2022-05-30 NOTE — Telephone Encounter (Signed)
Yes we can give her ondansetron 4 mg every 8 hours as needed. Do not give more than 10, no refills. She can have 10 mg prednisone to take up to twice daily as needed for hives. 20 tablets, no refills.

## 2022-05-30 NOTE — Telephone Encounter (Signed)
TY

## 2022-06-11 ENCOUNTER — Other Ambulatory Visit: Payer: Self-pay | Admitting: Internal Medicine

## 2022-06-11 MED ORDER — PREDNISONE 10 MG PO TABS: ORAL_TABLET | ORAL | 0 refills | Status: DC

## 2022-06-12 ENCOUNTER — Encounter: Payer: Self-pay | Admitting: Internal Medicine

## 2022-06-12 ENCOUNTER — Ambulatory Visit (INDEPENDENT_AMBULATORY_CARE_PROVIDER_SITE_OTHER)

## 2022-06-12 DIAGNOSIS — L501 Idiopathic urticaria: Secondary | ICD-10-CM | POA: Diagnosis not present

## 2022-06-18 ENCOUNTER — Other Ambulatory Visit: Payer: Self-pay | Admitting: Internal Medicine

## 2022-06-20 NOTE — Patient Instructions (Incomplete)
Chronic Autoimmune urticaria with current flare Start Xyzal 10 mg (2 tablets) in the morning and then 1 or 2 at night (2 if not taking hydroxyzine) Hydroxyzine as needed at night, try to limit use of this medication-this will replace one xyzal  Decrease Singulair 10 mg  to 1 tablet nightly  Increase Pepcid 20 mg twice daily Continue Xolair- every 2 week dosing Discussed possible cyclosporine if this regimen does not work   Angioedema Associated angioedema occurs in up to 50% of patients with chronic urticaria. Treatment/diagnostic plan as outlined above.  Allergic rhinitis Aeroallergen avoidance measures have been discussed and provided in written form. Levocetirizine has been prescribed (as above). A prescription has been provided for azelastine nasal spray, 1-2 sprays per nostril 2 times daily if needed. Proper nasal spray technique has been discussed and demonstrated.  Nasal saline spray (i.e., Simply Saline) or nasal saline lavage (i.e., NeilMed) is recommended as needed and prior to medicated nasal sprays.  Mild intermittent asthma Continue to have access to albuterol HFA, 1 to 2 inhalations every 4-6 hours if needed.   Follow-up in 4 weeks, sooner if needed.

## 2022-06-21 ENCOUNTER — Encounter: Payer: Self-pay | Admitting: Family

## 2022-06-21 ENCOUNTER — Ambulatory Visit

## 2022-06-21 ENCOUNTER — Ambulatory Visit (INDEPENDENT_AMBULATORY_CARE_PROVIDER_SITE_OTHER): Admitting: Family

## 2022-06-21 VITALS — BP 114/76 | HR 89 | Temp 98.1°F | Resp 18 | Wt <= 1120 oz

## 2022-06-21 DIAGNOSIS — J4520 Mild intermittent asthma, uncomplicated: Secondary | ICD-10-CM | POA: Diagnosis not present

## 2022-06-21 DIAGNOSIS — J3089 Other allergic rhinitis: Secondary | ICD-10-CM

## 2022-06-21 DIAGNOSIS — L501 Idiopathic urticaria: Secondary | ICD-10-CM | POA: Diagnosis not present

## 2022-06-21 DIAGNOSIS — L508 Other urticaria: Secondary | ICD-10-CM

## 2022-06-21 NOTE — Progress Notes (Signed)
[address] [place] [address] Dept: [phone]  FOLLOW UP NOTE  Patient ID: Shannon Huerta, female    DOB: 1991/01/29  Age: 31 y.o. MRN: [id number] Date of Office Visit: 06/21/2022  Assessment  Chief Complaint: Follow-up and Urticaria  HPI Shannon Huerta is a 31 year old female who presents today for an acute visit of urticaria.  She was last seen on May 24, 2022 by Dr. Maurine Minister for chronic autoimmune urticaria with current flare, angioedema, allergic rhinitis, and mild intermittent asthma.  She denies any new diagnosis or surgery since her last office visit.  Chronic autoimmune urticaria: She reports that her hives have flared for about the past 2 to 3 months and that it really spiked about a week ago.  Her Xolair was increased now to every 2 weeks from where she was previously getting Xolair every 4 weeks.  She felt like last week when her hives were really flared that she wanted to "chew her skin off."  She began taking prednisone 20 mg once a day, but it was not working.  She then increased her prednisone to 20 mg twice a day for a couple days and this seemed to help.  She did notice while she was on the prednisone 20 mg twice a day that she retained fluids and would have to urinate at least 6 times at night. This has gotten better since being off of prednisone. She contacted our office once she finished her prednisone.  Discussed the side effects of prednisone and why they were not good to take daily.  She reports yesterday when she was out of prednisone that she took hydroxyzine during the day and then again at night.  This morning she woke up and has not really had any hives.  She mentions that she has had these hives for 20- 25+ years. She has only been taking Xyzal 1 to 2 tablets in the morning and most of the time she forgets to take them every day.  She also will take 1 Allegra at night.  She has been taking Singulair 10 mg 1 to 2 tablets at night and Pepcid 20 mg at night  only.   Drug Allergies:  Not on File  Review of Systems: Review of Systems  Constitutional:  Negative for chills and fever.  HENT:         Denies rhinorrhea, nasal congestion, and postnasal drip  Eyes:        Reports eyes burn when her hives breakout.  Respiratory:  Negative for cough, shortness of breath and wheezing.   Cardiovascular:  Negative for chest pain and palpitations.  Gastrointestinal:        Reports a little bit of heartburn and reflux  Genitourinary:  Positive for frequency.       Reports increased frequency of urination with prednisone use  Skin:  Positive for itching and rash.  Neurological:  Positive for headaches.  Endo/Heme/Allergies:  Positive for environmental allergies.    Physical Exam: BP 114/76 (BP Location: Left Arm, Patient Position: Sitting, Cuff Size: Normal)   Pulse 89   Temp 98.1 F (36.7 C) (Temporal)   Resp 18   Wt 10 lb 11.2 oz (4.853 kg)   SpO2 100%   BMI 1.73 kg/m    Physical Exam Constitutional:      Appearance: Normal appearance.  HENT:     Head: Normocephalic and atraumatic.     Comments: Pharynx normal, eyes normal, ears normal, nose: Bilateral lower turbinates  mildly edematous and slightly erythematous with no drainage noted    Right Ear: Tympanic membrane, ear canal and external ear normal.     Left Ear: Tympanic membrane, ear canal and external ear normal.     Mouth/Throat:     Mouth: Mucous membranes are moist.     Pharynx: Oropharynx is clear.  Eyes:     Conjunctiva/sclera: Conjunctivae normal.  Cardiovascular:     Rate and Rhythm: Normal rate and regular rhythm.     Heart sounds: Normal heart sounds.  Pulmonary:     Effort: Pulmonary effort is normal.     Breath sounds: Normal breath sounds.     Comments: Lungs clear to auscultation Musculoskeletal:     Cervical back: Neck supple.  Skin:    General: Skin is warm.     Comments: No hives or urticarial lesions noted on exposed skin  Neurological:     Mental  Status: She is alert and oriented to person, place, and time.  Psychiatric:        Mood and Affect: Mood normal.        Behavior: Behavior normal.        Thought Content: Thought content normal.        Judgment: Judgment normal.    Diagnostics:  None  Assessment and Plan: 1. Chronic autoimmune urticaria   2. Allergic rhinitis   3. Mild intermittent asthma without complication     No orders of the defined types were placed in this encounter.   Patient Instructions  Chronic Autoimmune urticaria with current flare Start Xyzal 10 mg (2 tablets) in the morning and then 1 or 2 at night (2 if not taking hydroxyzine) Hydroxyzine as needed at night, try to limit use of this medication-this will replace one xyzal  Decrease Singulair 10 mg  to 1 tablet nightly  Increase Pepcid 20 mg twice daily Continue Xolair- every 2 week dosing Discussed possible cyclosporine if this regimen does not work   Angioedema Associated angioedema occurs in up to 50% of patients with chronic urticaria. Treatment/diagnostic plan as outlined above.  Allergic rhinitis Aeroallergen avoidance measures have been discussed and provided in written form. Levocetirizine has been prescribed (as above). A prescription has been provided for azelastine nasal spray, 1-2 sprays per nostril 2 times daily if needed. Proper nasal spray technique has been discussed and demonstrated.  Nasal saline spray (i.e., Simply Saline) or nasal saline lavage (i.e., NeilMed) is recommended as needed and prior to medicated nasal sprays.  Mild intermittent asthma Continue to have access to albuterol HFA, 1 to 2 inhalations every 4-6 hours if needed.   Follow-up in 4 weeks, sooner if needed.    Return in about 4 weeks (around 07/19/2022), or if symptoms worsen or fail to improve.    Thank you for the opportunity to care for this patient.  Please do not hesitate to contact me with questions.  Nehemiah Settle, FNP [facility]

## 2022-06-22 ENCOUNTER — Encounter: Payer: Self-pay | Admitting: Internal Medicine

## 2022-06-22 NOTE — Telephone Encounter (Signed)
I called her and asked that she call us back to discuss. She saw Chrissie yesterday and was asked to increase her antihistamines. I would like to talk to her in person. Please let me know if she calls.

## 2022-06-25 ENCOUNTER — Other Ambulatory Visit: Payer: Self-pay

## 2022-06-25 MED ORDER — FAMOTIDINE 20 MG PO TABS: 20.0000 mg | ORAL_TABLET | Freq: Two times a day (BID) | ORAL | 5 refills | Status: DC

## 2022-06-25 NOTE — Telephone Encounter (Signed)
I called and left her a message Friday before leaving instructing her to increase her antihistamines as discussed with Chrissie in clinic along with adding pepcid.  We should try this before doing more prednisone. We recently increased her xolair so hoping this will also help with control.

## 2022-06-26 ENCOUNTER — Ambulatory Visit

## 2022-06-29 ENCOUNTER — Encounter: Payer: Self-pay | Admitting: Internal Medicine

## 2022-07-02 ENCOUNTER — Other Ambulatory Visit: Payer: Self-pay

## 2022-07-02 MED ORDER — PREDNISONE 10 MG PO TABS: 40.0000 mg | ORAL_TABLET | Freq: Every day | ORAL | 0 refills | Status: DC

## 2022-07-02 NOTE — Telephone Encounter (Signed)
Can you find out when her next Xolair is due?   Also send in prednisone 40 mg daily x 4 days.  It looks like she had some recent labs and they were normal. I would like to talk to her about also starting cyclosporine to take with her Xolair q2 weeks.    When can she come in for visit? Can we do it on her next Xolair visit?

## 2022-07-13 ENCOUNTER — Ambulatory Visit

## 2022-07-13 ENCOUNTER — Encounter: Payer: Self-pay | Admitting: Internal Medicine

## 2022-07-18 ENCOUNTER — Ambulatory Visit (INDEPENDENT_AMBULATORY_CARE_PROVIDER_SITE_OTHER)

## 2022-07-18 DIAGNOSIS — L501 Idiopathic urticaria: Secondary | ICD-10-CM

## 2022-07-19 ENCOUNTER — Ambulatory Visit

## 2022-07-27 ENCOUNTER — Other Ambulatory Visit: Payer: Self-pay | Admitting: Internal Medicine

## 2022-07-27 MED ORDER — PREDNISONE 10 MG PO TABS: ORAL_TABLET | ORAL | 0 refills | Status: AC

## 2022-07-27 NOTE — Telephone Encounter (Signed)
Lm detailing to call office to get on schedule with dennis or  [facility] and to keep doing all meds and that a short burst of prednisone would b called into pharmacy

## 2022-07-27 NOTE — Addendum Note (Signed)
Addended byAlesia Morin on: 07/27/2022 04:17 PM   Modules accepted: Orders

## 2022-07-27 NOTE — Telephone Encounter (Signed)
Shannon Huerta, Can you please find out what she her anti-histamine regimen is and how often? Xyzal?Allegra?Pepcid?  She has received multiple courses of oral prednisone from [place] (1 this month and at least 3 last month) and she was supposed to follow up with [place] in 4 weeks since last visit in mid November but I don't see anything.

## 2022-07-27 NOTE — Telephone Encounter (Signed)
Due to uncontrolled hives and on high dose anti-histamine, will call in a prednisone burst.    She was also informed to call back to schedule a follow up ASAP.  Also might want to consider doing an Allegra 360mg  BID/Pepcid 40mg  BID regimen if she is unable to tolerate more Xyzal due to sleepiness with PRN hydroxyzine 10mg ; she is only doing Xyzal 1 in AM, 1 in PM and Pepcid 1 in AM and 1 in PM; I think we have told her this before too but can reiterate at the visit.  I did not change it today as I couldn't tell if Allegra was switched to Xyzal due to lack of response.

## 2022-07-27 NOTE — Telephone Encounter (Signed)
Please advise to refill thank you

## 2022-07-27 NOTE — Telephone Encounter (Signed)
In the morning she takes pepcid and one xyzal, at night she take montelukast, Pepcid, xyzal, and hydroxyzine and still having break outs she went a week and half without a breakout and said that stress of the holidays is what is causing this out break

## 2022-08-01 ENCOUNTER — Ambulatory Visit (INDEPENDENT_AMBULATORY_CARE_PROVIDER_SITE_OTHER): Admitting: *Deleted

## 2022-08-01 DIAGNOSIS — L501 Idiopathic urticaria: Secondary | ICD-10-CM

## 2022-08-02 ENCOUNTER — Other Ambulatory Visit: Payer: Self-pay | Admitting: Internal Medicine

## 2022-08-02 NOTE — Telephone Encounter (Signed)
Patient woke up this morning with lip and eye swelling. She is requesting a refill on prednisone. Please advise.

## 2022-08-03 ENCOUNTER — Telehealth: Payer: Self-pay | Admitting: Internal Medicine

## 2022-08-03 MED ORDER — PREDNISONE 10 MG PO TABS: ORAL_TABLET | ORAL | 0 refills | Status: AC

## 2022-08-03 NOTE — Telephone Encounter (Addendum)
08/03/2022 7:25 PM: I attempted calling patient regarding the mychart message she sent but unable to reach her.  I left a voicemail stating that I would call back in a few minutes.    I was able to reach her later. She reports having another flare up of hives/angioedema and believes it is related to the extra stress over the holidays. She is taking Xyzal twice daily, hydroxyzine at nighttime, singulair at nighttime, pepcid and extra benadryl.  Denies sleepiness/lethargy.  She reports always having shortness of breath with her CSU flare up but denies any worsening, wheezing, coughing, chest pain. She wishes to avoid another trip to the urgent care and was calling to start on a prednisone taper.  She has been on Xolair 300mg  every 2 weeks but still is having trouble with  her hives.  Will call in a prednisone taper; reports usually 1 day of prednisone 40mg  is enough to reduce the hives.  She was last seen by Nehemiah Settle and Dr. Maurine Minister with possible plans for cyclosporine 11/16; informed her to make a follow up ASAP as she was supposed to follow up in 4 weeks.

## 2022-08-07 ENCOUNTER — Encounter: Payer: Self-pay | Admitting: Nurse Practitioner

## 2022-08-07 ENCOUNTER — Encounter: Payer: Self-pay | Admitting: Internal Medicine

## 2022-08-07 NOTE — Progress Notes (Unsigned)
FOLLOW UP Date of Service/Encounter:  08/09/22   Subjective:  Shannon Huerta (DOB: [birth year]) is a 32 y.o. female who returns to the [facility] on 08/09/2022 in re-evaluation of the following: chronic idiopathic urticaria, intermittent asthma History obtained from: chart review and patient.  For Review, LV was on 06/21/22  with Althea Charon, FNP seen for acute visit for hives flare .  She was recently switched to Xolair 300 mg q2 weeks, had been on and off prednisone for several months. She was not compliant with her antihistamines so it was instructed she start those at 2 tablets twice daily, pepcid increased to 20 mg BID.  Cyclosporine discussed as an add-on if not controlled on this dosing at follow-up.  Phone call 08/01/22 requesting additional prednisone due to breakthrough flares during holiday season. Phone call 08/07/22: requesting referral to behavioral health due to anxiety from hives  Today presents for follow-up. She is currently on Xolair 300 mg every 2 weeks and is still requesting prednisone every 2-4 weeks.  She remains uncontrolled but realized the effects of prednisone on her body. She has been on q2 weeks Xolair since November.  She did have a bad break out over the weekend and thought possibly secondary to her husband giving her a viral illness.  She did call and request prednisone and was given prednisone.  She didn't respond to 40 mg and had to take another 10 mg that day.  The following day her hives had returned and she had to take 40 the following day.  She has tapered off, but hives are still coming and going.   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   She is having burning sensation and is at a point where she feels that she can not. She has been on steroids frequently over the past 3 months, but rarely has used more than 20 mg at a time.  She estimates about a handful  a few days where she took more than 20 mg at a time.    Allergies as of 08/09/2022   No Known Allergies      Medication List        Accurate as of August 09, 2022 10:59 AM. If you have any questions, ask your nurse or doctor.          STOP taking these medications    ondansetron 4 MG tablet Commonly known as: Zofran Stopped by: Clemon Chambers, MD       TAKE these medications    albuterol 108 (90 Base) MCG/ACT inhaler Commonly known as: VENTOLIN HFA Inhale 2 puffs into the lungs every 6 (six) hours as needed for wheezing or shortness of breath.   azelastine 0.1 % nasal spray Commonly known as: ASTELIN Place 1-2 sprays into both nostrils 2 (two) times daily as needed for rhinitis.   cycloSPORINE 25 MG capsule Commonly known as: SANDIMMUNE Take 1 capsule (25 mg total) by mouth 2 (two) times daily. Started by: Clemon Chambers, MD   EPINEPHrine 0.3 mg/0.3 mL Soaj injection Commonly known as: EpiPen 2-Pak Inject 0.3 mg into the muscle as needed for anaphylaxis.   famotidine 20 MG tablet Commonly known as: Pepcid Take 1 tablet (20 mg total) by mouth 2 (two) times daily.   fluticasone-salmeterol 100-50 MCG/ACT Aepb Commonly known as: ADVAIR Inhale into the lungs.   hydrOXYzine 25 MG  tablet Commonly known as: ATARAX Take 25 mg by mouth daily.   levocetirizine 5 MG tablet Commonly known as: XYZAL Take 1 tablet daily, can increase to 2 tablets twice daily as needed for hives.  This is maximum dose.  Do not take with other antihistamines such as hydroxyzine.   meclizine 12.5 MG tablet Commonly known as: ANTIVERT Take 1 tablet (12.5 mg total) by mouth 3 (three) times daily as needed for dizziness.   montelukast 10 MG tablet Commonly known as: Singulair Take 1 tablet (10 mg total) by mouth at bedtime.   predniSONE 10 MG tablet Commonly known as: DELTASONE Take 10 mg prednisone to take up to twice daily as needed for hives. What changed: Another medication with the  same name was changed. Make sure you understand how and when to take each. Changed by: Clemon Chambers, MD   predniSONE 10 MG tablet Commonly known as: DELTASONE Take 4 tablets (40 mg total) by mouth daily with breakfast for 1 day, THEN 2 tablets (20 mg total) daily with breakfast for 3 days, THEN 1 tablet (10 mg total) daily with breakfast for 3 days, THEN 0.5 tablets (5 mg total) daily with breakfast for 3 days. Start taking on: August 03, 2022 What changed: Another medication with the same name was changed. Make sure you understand how and when to take each. Changed by: Clemon Chambers, MD   predniSONE 10 MG tablet Commonly known as: DELTASONE Take 1-2 tablets by mouth daily as needed for hives. Stop after 2 weeks. What changed:  how much to take how to take this when to take this additional instructions Changed by: Clemon Chambers, MD   Xolair 150 MG injection Generic drug: omalizumab Inject 300 mg into the skin every 14 (fourteen) days.       Past Medical History:  Diagnosis Date   Angio-edema    Anxiety 2012   Asthma    Connective tissue disease ([place])    Urticaria    History reviewed. No pertinent surgical history. Otherwise, there have been no changes to her past medical history, surgical history, family history, or social history.  ROS: All others negative except as noted per HPI.   Objective:  BP 122/78   Pulse 87   Temp 97.9 F (36.6 C) (Temporal)   Resp 20   SpO2 100%  There is no height or weight on file to calculate BMI. Physical Exam: General Appearance:  Alert, cooperative, no distress, appears stated age  Head:  Normocephalic, without obvious abnormality, atraumatic  Eyes:  Conjunctiva clear, EOM's intact  Nose: Nares normal, normal mucosa  Throat: Lips, tongue normal; teeth and gums normal, normal posterior oropharnyx  Neck: Supple, symmetrical  Lungs:   clear to auscultation bilaterally, Respirations unlabored, no coughing  Heart:  regular rate  and rhythm and no murmur, Appears well perfused  Extremities: No edema  Skin: Skin color, texture, turgor normal, scattered circular edematous erythematous patches on face and neck  Neurologic: No gross deficits    Assessment/Plan  Not controlled on xolair 300 mg q2 weeks. Significant life stressors + recent illness.   Chronic prednisone use. Mostly using less than 20 mg daily but has used up to 50 mg for a few days recently. Having significant anxiety related to hives, life stressors and prednisone use. Behavorial health referral placed and patient provided with numbers for acute care if needed by referral coordinator working on case.   Chronic Autoimmune urticaria with current flare Xyzal 10 mg (  2 tablets) in the morning and then 1 or 2 at night (2 if not taking hydroxyzine) Hydroxyzine as needed at night, try to limit use of this medication-this will replace one xyzal Singulair 10 mg  to 1 tablet nightly Pepcid 20 mg twice daily Continue Xolair- 300 mg every 2 week dosing Add: Cyclosporine 50 mg twice daiy Prednisone 10 mg : take 1-2 tablets daily as needed for the next 2 weeks for ihves   Angioedema Associated angioedema occurs in up to 50% of patients with chronic urticaria. Treatment/diagnostic plan as outlined above.  Allergic rhinitis-stable Aeroallergen avoidance measures have been discussed and provided in written form. Levocetirizine has been prescribed (as above). azelastine nasal spray, 1-2 sprays per nostril 2 times daily if needed. Proper nasal spray technique has been discussed and demonstrated.  Nasal saline spray (i.e., Simply Saline) or nasal saline lavage (i.e., NeilMed) is recommended as needed and prior to medicated nasal sprays.  Mild intermittent asthma-stable Continue to have access to albuterol HFA, 1 to 2 inhalations every 4-6 hours if needed.   Follow-up in 12 weeks, sooner if needed.  It was a pleasure seeing you again in clinic today!  Sigurd Sos,  MD  [facility]

## 2022-08-07 NOTE — Telephone Encounter (Signed)
Thanks Priya.  [facility] staff-Can we call to get her a follow-up please? Thanks

## 2022-08-07 NOTE — Telephone Encounter (Signed)
She has an appointment scheduled with Dr. Simona Huh on Thursday or do you want me to work her in with you tomorrow?

## 2022-08-08 NOTE — Telephone Encounter (Signed)
[place] ADMIN: Can we please send Shannon Huerta to [facility] in [place] for anxiety?  Let me know if the order needs to be placed.  Thanks Lelan Pons!

## 2022-08-09 ENCOUNTER — Ambulatory Visit (INDEPENDENT_AMBULATORY_CARE_PROVIDER_SITE_OTHER): Admitting: Internal Medicine

## 2022-08-09 ENCOUNTER — Encounter: Payer: Self-pay | Admitting: Internal Medicine

## 2022-08-09 VITALS — BP 122/78 | HR 87 | Temp 97.9°F | Resp 20

## 2022-08-09 DIAGNOSIS — J4520 Mild intermittent asthma, uncomplicated: Secondary | ICD-10-CM | POA: Diagnosis not present

## 2022-08-09 DIAGNOSIS — L508 Other urticaria: Secondary | ICD-10-CM

## 2022-08-09 DIAGNOSIS — J3089 Other allergic rhinitis: Secondary | ICD-10-CM

## 2022-08-09 MED ORDER — PREDNISONE 10 MG PO TABS: ORAL_TABLET | ORAL | 0 refills | Status: DC

## 2022-08-09 MED ORDER — CYCLOSPORINE 25 MG PO CAPS: 25.0000 mg | ORAL_CAPSULE | Freq: Two times a day (BID) | ORAL | 3 refills | Status: DC

## 2022-08-09 NOTE — Patient Instructions (Signed)
Chronic Autoimmune urticaria with current flare Xyzal 10 mg (2 tablets) in the morning and then 1 or 2 at night (2 if not taking hydroxyzine) Hydroxyzine as needed at night, try to limit use of this medication-this will replace one xyzal Singulair 10 mg  to 1 tablet nightly Pepcid 20 mg twice daily Continue Xolair- 300 mg every 2 week dosing Add: Cyclosporine 50 mg twice daiy Prednisone 10 mg : take 1-2 tablets daily as needed for the next 2 weeks for ihves   Angioedema Associated angioedema occurs in up to 50% of patients with chronic urticaria. Treatment/diagnostic plan as outlined above.  Allergic rhinitis Aeroallergen avoidance measures have been discussed and provided in written form. Levocetirizine has been prescribed (as above). azelastine nasal spray, 1-2 sprays per nostril 2 times daily if needed. Proper nasal spray technique has been discussed and demonstrated.  Nasal saline spray (i.e., Simply Saline) or nasal saline lavage (i.e., NeilMed) is recommended as needed and prior to medicated nasal sprays.  Mild intermittent asthma Continue to have access to albuterol HFA, 1 to 2 inhalations every 4-6 hours if needed.   Follow-up in 12 weeks, sooner if needed.  It was a pleasure seeing you again in clinic today! Thank you for allowing me to participate in your care.  Sigurd Sos, MD [facility]

## 2022-08-09 NOTE — Telephone Encounter (Signed)
Dr. Simona Huh saw Shannon Huerta today.

## 2022-08-14 ENCOUNTER — Other Ambulatory Visit: Payer: Self-pay

## 2022-08-14 ENCOUNTER — Ambulatory Visit (INDEPENDENT_AMBULATORY_CARE_PROVIDER_SITE_OTHER): Admitting: Family Medicine

## 2022-08-14 ENCOUNTER — Encounter: Payer: Self-pay | Admitting: Family Medicine

## 2022-08-14 ENCOUNTER — Encounter: Payer: Self-pay | Admitting: Internal Medicine

## 2022-08-14 ENCOUNTER — Ambulatory Visit: Admitting: Nurse Practitioner

## 2022-08-14 VITALS — Wt 180.0 lb

## 2022-08-14 DIAGNOSIS — J3089 Other allergic rhinitis: Secondary | ICD-10-CM

## 2022-08-14 DIAGNOSIS — K219 Gastro-esophageal reflux disease without esophagitis: Secondary | ICD-10-CM | POA: Diagnosis not present

## 2022-08-14 DIAGNOSIS — J4540 Moderate persistent asthma, uncomplicated: Secondary | ICD-10-CM | POA: Diagnosis not present

## 2022-08-14 DIAGNOSIS — L508 Other urticaria: Secondary | ICD-10-CM | POA: Diagnosis not present

## 2022-08-14 MED ORDER — PREDNISONE 10 MG PO TABS: ORAL_TABLET | ORAL | 0 refills | Status: DC

## 2022-08-14 NOTE — Telephone Encounter (Signed)
Dr. Simona Huh please advise patient's message sent via [place]. Thanks

## 2022-08-14 NOTE — Patient Instructions (Addendum)
Hives Begin prednisone 10 mg tablets. Take 2 tablets twice a day for 5 days, then take 2 tablets once a day for 5 days, then take 1 tablet once a day for 2 days, then stop Use the least amount of medications possible while remaining hive free Levocetirizine (Xyzal) 5 mg twice a day and famotidine (Pepcid) 20 mg twice a day. If no symptoms for 7-14 days then decrease to. Levocetirizine (Xyzal) 5 mg twice a day and famotidine (Pepcid) 20 mg once a day.  If no symptoms for 7-14 days then decrease to. Levocetirizine (Xyzal) 5 mg twice a day.  If no symptoms for 7-14 days then decrease to. Levocetirizine (Xyzal) 5 mg once a day.  Continue montelukast 10 mg once a day Continue hydroxyzine 25 mg once a day at bedtime as needed for itch Continue Xolair 300 mg once every 2 weeks and have access to an epinephrine autoinjector set Continue cyclosporine 25 mg twice a day as previously prescribed  Angioedema Associated angioedema occurs in up to 50% of patients with chronic urticaria.  Continue with the treatment plan as listed above  Asthma Begin Advair 100-1 puff twice a day to control asthma Continue albuterol 2 puffs once every 4 hours as needed for cough or wheeze You may use albuterol 2 puffs 5 to 15 minutes before activity to decrease cough or wheeze  Allergic rhinitis Continue allergen avoidance measures directed toward grass pollen, mold, and dust mite as listed below  Continue Xyzal 5 mg once a day as needed for runny nose or itch Continue azelastine nasal spray 1 to 2 sprays per nostril 2 times a day if needed Consider saline nasal rinses as needed for nasal symptoms. Use this before any medicated nasal sprays for best result  Reflux Continue dietary lifestyle modifications as listed below Begin omeprazole 20 mg once a day to control reflux. Take this medication 30 minutes before your first meal for best results  Call the clinic if this treatment plan is not working well for  you.  Follow up in the clinic in 2 weeks or sooner if needed.  Reducing Pollen Exposure The American Academy of Allergy, Asthma and Immunology suggests the following steps to reduce your exposure to pollen during allergy seasons. Do not hang sheets or clothing out to dry; pollen may collect on these items. Do not mow lawns or spend time around freshly cut grass; mowing stirs up pollen. Keep windows closed at night.  Keep car windows closed while driving. Minimize morning activities outdoors, a time when pollen counts are usually at their highest. Stay indoors as much as possible when pollen counts or humidity is high and on windy days when pollen tends to remain in the air longer. Use air conditioning when possible.  Many air conditioners have filters that trap the pollen spores. Use a HEPA room air filter to remove pollen form the indoor air you breathe.  Control of Mold Allergen Mold and fungi can grow on a variety of surfaces provided certain temperature and moisture conditions exist.  Outdoor molds grow on plants, decaying vegetation and soil.  The major outdoor mold, Alternaria and Cladosporium, are found in very high numbers during hot and dry conditions.  Generally, a late Summer - Fall peak is seen for common outdoor fungal spores.  Rain will temporarily lower outdoor mold spore count, but counts rise rapidly when the rainy period ends.  The most important indoor molds are Aspergillus and Penicillium.  Dark, humid and poorly ventilated basements  are ideal sites for mold growth.  The next most common sites of mold growth are the bathroom and the kitchen.  Outdoor [employer] Use air conditioning and keep windows closed Avoid exposure to decaying vegetation. Avoid leaf raking. Avoid grain handling. Consider wearing a face mask if working in moldy areas.  Indoor Mold Control Maintain humidity below 50%. Clean washable surfaces with 5% bleach solution. Remove sources e.g. Contaminated  carpets.   Control of Dust Mite Allergen Dust mites play a major role in allergic asthma and rhinitis. They occur in environments with high humidity wherever human skin is found. Dust mites absorb humidity from the atmosphere (ie, they do not drink) and feed on organic matter (including shed human and animal skin). Dust mites are a microscopic type of insect that you cannot see with the naked eye. High levels of dust mites have been detected from mattresses, pillows, carpets, upholstered furniture, bed covers, clothes, soft toys and any woven material. The principal allergen of the dust mite is found in its feces. A gram of dust may contain 1,000 mites and 250,000 fecal particles. Mite antigen is easily measured in the air during house cleaning activities. Dust mites do not bite and do not cause harm to humans, other than by triggering allergies/asthma.  Ways to decrease your exposure to dust mites in your home:  1. Encase mattresses, box springs and pillows with a mite-impermeable barrier or cover  2. Wash sheets, blankets and drapes weekly in hot water (130 F) with detergent and dry them in a dryer on the hot setting.  3. Have the room cleaned frequently with a vacuum cleaner and a damp dust-mop. For carpeting or rugs, vacuuming with a vacuum cleaner equipped with a high-efficiency particulate air (HEPA) filter. The dust mite allergic individual should not be in a room which is being cleaned and should wait 1 hour after cleaning before going into the room.  4. Do not sleep on upholstered furniture (eg, couches).  5. If possible removing carpeting, upholstered furniture and drapery from the home is ideal. Horizontal blinds should be eliminated in the rooms where the person spends the most time (bedroom, study, television room). Washable vinyl, roller-type shades are optimal.  6. Remove all non-washable stuffed toys from the bedroom. Wash stuffed toys weekly like sheets and blankets above.  7.  Reduce indoor humidity to less than 50%. Inexpensive humidity monitors can be purchased at most hardware stores. Do not use a humidifier as can make the problem worse and are not recommended.

## 2022-08-14 NOTE — Progress Notes (Signed)
RE: Shannon Huerta MRN: [id number] DOB: Nov 06, 1990 Date of Telemedicine Visit: 08/14/2022  Referring provider: [name], [place] Primary care provider: [name], FNP  Chief Complaint: Rash   Telemedicine Follow Up Visit via Telephone: I connected with Shannon Huerta for a follow up on 08/14/22 by telephone and verified that I am speaking with the correct person using two identifiers.   I discussed the limitations, risks, security and privacy concerns of performing an evaluation and management service by telephone and the availability of in person appointments. I also discussed with the patient that there may be a patient responsible charge related to this service. The patient expressed understanding and agreed to proceed.  Patient is at home  Provider is at the office.  Visit start time: 231 Visit end time: 76 Insurance consent/check in by: sherri Medical consent and medical assistant/nurse: marie  History of Present Illness: She is a 32 y.o. female, who is being followed for asthma, angioedema, allergic rhinitis, ans chronic urticaria.  Her previous allergy office visit was on 08/09/2021 with  Dr. Simona Huh . At today's visit, she reports that she continues to experience nearly daily hive breakouts since November 23. She reports a history of urticaria that began in childhood with good control while receiving Xolair injections until the last week in November. She is currently taking Xyzal twice a day, famotidine daily, hydroxyzine at night, cyclosporine 25 mg twice a day, and montelukast daily. Her Xolair injections increased from 300 mg once every 28 days to 300 mg once every 14 days on 07/18/2022. She denies recent illness, however, reports that her son has recently been ill. She does report that her voice is raspy beginning today. Asthma is reported as moderately well controlled with occasional shortness of breath, intermittent wheeze that seems to occur mainly at night, and dry cough  beginning a few days ago. She continues albuterol daily over the last few days with moderate relief of symptoms. She reports that she has Advair, however, rarely uses this medication. Reflux is reported as poorly controlled with heartburn occurring over the last few days. She continues famotidine for hive control and has previously taken omeprazole for reflux. She is not currently taking omeprazole. She does report an increase in her stress level at the current time. Her current medications are listed in the chart.    Assessment and Plan: Shannon Huerta is a 32 y.o. female with: Patient Instructions  Hives Begin prednisone 10 mg tablets. Take 2 tablets twice a day for 5 days, then take 2 tablets once a day for 5 days, then take 1 tablet once a day for 2 days, then stop Use the least amount of medications possible while remaining hive free Levocetirizine (Xyzal) 5 mg twice a day and famotidine (Pepcid) 20 mg twice a day. If no symptoms for 7-14 days then decrease to. Levocetirizine (Xyzal) 5 mg twice a day and famotidine (Pepcid) 20 mg once a day.  If no symptoms for 7-14 days then decrease to. Levocetirizine (Xyzal) 5 mg twice a day.  If no symptoms for 7-14 days then decrease to. Levocetirizine (Xyzal) 5 mg once a day.  Continue montelukast 10 mg once a day Continue hydroxyzine 25 mg once a day at bedtime as needed for itch Continue Xolair 300 mg once every 2 weeks and have access to an epinephrine autoinjector set Continue cyclosporine 25 mg twice a day as previously prescribed  Angioedema Associated angioedema occurs in up to 50% of patients with chronic urticaria.  Continue with  the treatment plan as listed above  Asthma Begin Advair 100-1 puff twice a day to control asthma Continue albuterol 2 puffs once every 4 hours as needed for cough or wheeze You may use albuterol 2 puffs 5 to 15 minutes before activity to decrease cough or wheeze  Allergic rhinitis Continue allergen avoidance measures  directed toward grass pollen, mold, and dust mite as listed below  Continue Xyzal 5 mg once a day as needed for runny nose or itch Continue azelastine nasal spray 1 to 2 sprays per nostril 2 times a day if needed Consider saline nasal rinses as needed for nasal symptoms. Use this before any medicated nasal sprays for best result  Reflux Continue dietary lifestyle modifications as listed below Begin omeprazole 20 mg once a day to control reflux. Take this medication 30 minutes before your first meal for best results  Call the clinic if this treatment plan is not working well for you.  Follow up in the clinic in 2 weeks or sooner if needed.   Return in about 2 weeks (around 08/28/2022), or if symptoms worsen or fail to improve.  Meds ordered this encounter  Medications   predniSONE (DELTASONE) 10 MG tablet    Sig: Begin prednisone 10 mg tablets. Take 2 tablets twice a day for 5 days, then take 2 tablets once a day for 5 days, then take 1 tablet once a day for 2 days, then stop    Dispense:  32 tablet    Refill:  0     Medication List:  Current Outpatient Medications  Medication Sig Dispense Refill   albuterol (VENTOLIN HFA) 108 (90 Base) MCG/ACT inhaler Inhale 2 puffs into the lungs every 6 (six) hours as needed for wheezing or shortness of breath. 8 g 2   azelastine (ASTELIN) 0.1 % nasal spray Place 1-2 sprays into both nostrils 2 (two) times daily as needed for rhinitis. 30 mL 5   cycloSPORINE (SANDIMMUNE) 25 MG capsule Take 1 capsule (25 mg total) by mouth 2 (two) times daily. 60 capsule 3   EPINEPHrine (EPIPEN 2-PAK) 0.3 mg/0.3 mL IJ SOAJ injection Inject 0.3 mg into the muscle as needed for anaphylaxis. 2 each 2   famotidine (PEPCID) 20 MG tablet Take 1 tablet (20 mg total) by mouth 2 (two) times daily. 60 tablet 5   fluticasone-salmeterol (ADVAIR) 100-50 MCG/ACT AEPB Inhale into the lungs.     hydrOXYzine (ATARAX) 25 MG tablet Take 25 mg by mouth daily.     levocetirizine  (XYZAL) 5 MG tablet Take 1 tablet daily, can increase to 2 tablets twice daily as needed for hives.  This is maximum dose.  Do not take with other antihistamines such as hydroxyzine. 120 tablet 5   meclizine (ANTIVERT) 12.5 MG tablet Take 1 tablet (12.5 mg total) by mouth 3 (three) times daily as needed for dizziness. 30 tablet 0   montelukast (SINGULAIR) 10 MG tablet Take 1 tablet (10 mg total) by mouth at bedtime. 30 tablet 3   omalizumab (XOLAIR) 150 MG injection Inject 300 mg into the skin every 14 (fourteen) days. 4 each 11   predniSONE (DELTASONE) 10 MG tablet Begin prednisone 10 mg tablets. Take 2 tablets twice a day for 5 days, then take 2 tablets once a day for 5 days, then take 1 tablet once a day for 2 days, then stop 32 tablet 0   Current Facility-Administered Medications  Medication Dose Route Frequency Provider Last Rate Last Admin   omalizumab Geoffry Paradise) injection  300 mg  300 mg Subcutaneous Q28 days Bobbitt, Heywood Iles, MD   300 mg at 05/14/22 1037   omalizumab Geoffry Paradise) injection 300 mg  300 mg Subcutaneous Q14 Days Verlee Monte, MD   300 mg at 08/01/22 1121   Allergies: Not on File I reviewed her past medical history, social history, family history, and environmental history and no significant changes have been reported from previous visit on 08/09/2022.   Objective: Physical Exam Not obtained as encounter was done via telephone.   Previous notes and tests were reviewed.  I discussed the assessment and treatment plan with the patient. The patient was provided an opportunity to ask questions and all were answered. The patient agreed with the plan and demonstrated an understanding of the instructions.   The patient was advised to call back or seek an in-person evaluation if the symptoms worsen or if the condition fails to improve as anticipated.  I provided 20 minutes of non-face-to-face time during this encounter.  It was my pleasure to participate in [facility] care  today. Please feel free to contact me with any questions or concerns.   Sincerely,  Thermon Leyland, FNP

## 2022-08-14 NOTE — Telephone Encounter (Signed)
Can we get [facility] in today for an acute appointment?  [facility] has some openings  She may need a steroid injection and more prolonged prednisone course

## 2022-08-16 ENCOUNTER — Ambulatory Visit

## 2022-08-17 ENCOUNTER — Ambulatory Visit

## 2022-08-20 ENCOUNTER — Ambulatory Visit (INDEPENDENT_AMBULATORY_CARE_PROVIDER_SITE_OTHER)

## 2022-08-20 DIAGNOSIS — L501 Idiopathic urticaria: Secondary | ICD-10-CM | POA: Diagnosis not present

## 2022-08-30 ENCOUNTER — Other Ambulatory Visit: Payer: Self-pay | Admitting: Family Medicine

## 2022-08-30 ENCOUNTER — Encounter: Payer: Self-pay | Admitting: Internal Medicine

## 2022-08-30 NOTE — Telephone Encounter (Signed)
Please advise to prednisone refill for pts flare up

## 2022-08-30 NOTE — Telephone Encounter (Signed)
Already denied by Dr. Simona Huh

## 2022-09-03 ENCOUNTER — Ambulatory Visit

## 2022-09-10 ENCOUNTER — Other Ambulatory Visit: Payer: Self-pay | Admitting: Internal Medicine

## 2022-09-10 ENCOUNTER — Ambulatory Visit (INDEPENDENT_AMBULATORY_CARE_PROVIDER_SITE_OTHER)

## 2022-09-10 DIAGNOSIS — L501 Idiopathic urticaria: Secondary | ICD-10-CM

## 2022-09-18 ENCOUNTER — Ambulatory Visit (HOSPITAL_COMMUNITY): Admitting: Psychiatry

## 2022-09-24 ENCOUNTER — Ambulatory Visit (INDEPENDENT_AMBULATORY_CARE_PROVIDER_SITE_OTHER)

## 2022-09-24 DIAGNOSIS — L501 Idiopathic urticaria: Secondary | ICD-10-CM | POA: Diagnosis not present

## 2022-10-05 ENCOUNTER — Ambulatory Visit (HOSPITAL_COMMUNITY): Admitting: Psychiatry

## 2022-10-11 ENCOUNTER — Ambulatory Visit (INDEPENDENT_AMBULATORY_CARE_PROVIDER_SITE_OTHER)

## 2022-10-11 DIAGNOSIS — L501 Idiopathic urticaria: Secondary | ICD-10-CM | POA: Diagnosis not present

## 2022-10-12 ENCOUNTER — Ambulatory Visit

## 2022-10-25 ENCOUNTER — Ambulatory Visit (INDEPENDENT_AMBULATORY_CARE_PROVIDER_SITE_OTHER)

## 2022-10-25 ENCOUNTER — Ambulatory Visit

## 2022-10-25 DIAGNOSIS — L501 Idiopathic urticaria: Secondary | ICD-10-CM | POA: Diagnosis not present

## 2022-11-12 ENCOUNTER — Ambulatory Visit

## 2022-11-13 ENCOUNTER — Encounter: Payer: Self-pay | Admitting: Internal Medicine

## 2022-11-16 ENCOUNTER — Ambulatory Visit

## 2022-11-19 ENCOUNTER — Ambulatory Visit (INDEPENDENT_AMBULATORY_CARE_PROVIDER_SITE_OTHER): Admitting: *Deleted

## 2022-11-19 DIAGNOSIS — L501 Idiopathic urticaria: Secondary | ICD-10-CM

## 2022-11-19 MED ORDER — OMALIZUMAB 150 MG/ML ~~LOC~~ SOSY
300.0000 mg | PREFILLED_SYRINGE | SUBCUTANEOUS | Status: DC
  Administered 2022-11-19 – 2023-06-03 (×10): 300 mg via SUBCUTANEOUS

## 2022-12-03 ENCOUNTER — Ambulatory Visit

## 2022-12-04 ENCOUNTER — Ambulatory Visit (INDEPENDENT_AMBULATORY_CARE_PROVIDER_SITE_OTHER)

## 2022-12-04 DIAGNOSIS — L501 Idiopathic urticaria: Secondary | ICD-10-CM | POA: Diagnosis not present

## 2022-12-12 ENCOUNTER — Other Ambulatory Visit: Payer: Self-pay | Admitting: Family Medicine

## 2022-12-12 ENCOUNTER — Encounter: Payer: Self-pay | Admitting: Internal Medicine

## 2022-12-12 MED ORDER — PREDNISONE 20 MG PO TABS: 20.0000 mg | ORAL_TABLET | Freq: Two times a day (BID) | ORAL | 0 refills | Status: AC

## 2022-12-12 NOTE — Telephone Encounter (Signed)
Please send in 40 mg daily x 5 days.  Send as 20 mg tablets and have her take what she needs and save the rest in case of future flares. I'm glad her hives have calmed down.

## 2022-12-17 ENCOUNTER — Other Ambulatory Visit: Payer: Self-pay | Admitting: *Deleted

## 2022-12-17 MED ORDER — OMALIZUMAB 150 MG/ML ~~LOC~~ SOSY: 150.0000 mg | PREFILLED_SYRINGE | SUBCUTANEOUS | 3 refills | Status: DC

## 2022-12-19 ENCOUNTER — Ambulatory Visit (INDEPENDENT_AMBULATORY_CARE_PROVIDER_SITE_OTHER): Admitting: *Deleted

## 2022-12-19 DIAGNOSIS — L501 Idiopathic urticaria: Secondary | ICD-10-CM | POA: Diagnosis not present

## 2022-12-26 ENCOUNTER — Other Ambulatory Visit: Payer: Self-pay | Admitting: *Deleted

## 2022-12-26 MED ORDER — OMALIZUMAB 150 MG/ML ~~LOC~~ SOSY: 300.0000 mg | PREFILLED_SYRINGE | SUBCUTANEOUS | 3 refills | Status: DC

## 2023-01-01 ENCOUNTER — Ambulatory Visit (INDEPENDENT_AMBULATORY_CARE_PROVIDER_SITE_OTHER): Admitting: Family Medicine

## 2023-01-01 ENCOUNTER — Other Ambulatory Visit: Payer: Self-pay | Admitting: Family Medicine

## 2023-01-01 ENCOUNTER — Encounter: Payer: Self-pay | Admitting: Family Medicine

## 2023-01-01 DIAGNOSIS — L508 Other urticaria: Secondary | ICD-10-CM

## 2023-01-01 DIAGNOSIS — J4520 Mild intermittent asthma, uncomplicated: Secondary | ICD-10-CM | POA: Diagnosis not present

## 2023-01-01 DIAGNOSIS — J3089 Other allergic rhinitis: Secondary | ICD-10-CM | POA: Diagnosis not present

## 2023-01-01 DIAGNOSIS — K219 Gastro-esophageal reflux disease without esophagitis: Secondary | ICD-10-CM | POA: Diagnosis not present

## 2023-01-01 MED ORDER — PREDNISONE 10 MG PO TABS: ORAL_TABLET | ORAL | 0 refills | Status: DC

## 2023-01-01 NOTE — Patient Instructions (Signed)
Hives Begin prednisone 10 mg tablets. Take 2 tablets twice a day for 5 days, then take 2 tablets once a day for 5 days, then take 1 tablet once a day for 2 days, then stop Use the least amount of medications possible while remaining hive free Levocetirizine (Xyzal) 5 mg twice a day and famotidine (Pepcid) 20 mg twice a day. If no symptoms for 7-14 days then decrease to. Levocetirizine (Xyzal) 5 mg twice a day and famotidine (Pepcid) 20 mg once a day.  If no symptoms for 7-14 days then decrease to. Levocetirizine (Xyzal) 5 mg twice a day.  If no symptoms for 7-14 days then decrease to. Levocetirizine (Xyzal) 5 mg once a day.  Continue montelukast 10 mg once a day Continue hydroxyzine 25 mg once a day at bedtime as needed for itch Continue Xolair 300 mg once every 2 weeks and have access to an epinephrine autoinjector set  Angioedema Associated angioedema occurs in up to 50% of patients with chronic urticaria.  Continue with the treatment plan as listed above  Asthma Begin Advair 100-1 puff twice a day to control asthma Continue albuterol 2 puffs once every 4 hours as needed for cough or wheeze You may use albuterol 2 puffs 5 to 15 minutes before activity to decrease cough or wheeze  Allergic rhinitis Continue allergen avoidance measures directed toward grass pollen, mold, and dust mite as listed below  Continue Xyzal 5 mg once a day as needed for runny nose or itch Continue azelastine nasal spray 1 to 2 sprays per nostril 2 times a day if needed Consider saline nasal rinses as needed for nasal symptoms. Use this before any medicated nasal sprays for best result  Reflux Continue dietary lifestyle modifications as listed below Begin omeprazole 20 mg once a day to control reflux. Take this medication 30 minutes before your first meal for best results  Call the clinic if this treatment plan is not working well for you.  Follow up in the clinic with Dr. Maurine Minister in 2 weeks or sooner if  needed.  Reducing Pollen Exposure The American Academy of All[employer], [employer] and [employer] suggests the following steps to reduce your exposure to pollen during allergy seasons. Do not hang sheets or clothing out to dry; pollen may collect on these items. Do not mow lawns or spend time around freshly cut grass; mowing stirs up pollen. Keep windows closed at night.  Keep car windows closed while driving. Minimize morning activities outdoors, a time when pollen counts are usually at their highest. Stay indoors as much as possible when pollen counts or humidity is high and on windy days when pollen tends to remain in the air longer. Use air conditioning when possible.  Many air conditioners have filters that trap the pollen spores. Use a HEPA room air filter to remove pollen form the indoor air you breathe.  Control of Mold Allergen Mold and fungi can grow on a variety of surfaces provided certain temperature and moisture conditions exist.  Outdoor molds grow on plants, decaying vegetation and soil.  The major outdoor mold, Alternaria and Cladosporium, are found in very high numbers during hot and dry conditions.  Generally, a late Summer - Fall peak is seen for common outdoor fungal spores.  Rain will temporarily lower outdoor mold spore count, but counts rise rapidly when the rainy period ends.  The most important indoor molds are Aspergillus and Penicillium.  Dark, humid and poorly ventilated basements are ideal sites for mold growth.  The next most common sites of mold growth are the bathroom and the kitchen.  Outdoor [employer] Use air conditioning and keep windows closed Avoid exposure to decaying vegetation. Avoid leaf raking. Avoid grain handling. Consider wearing a face mask if working in moldy areas.  Indoor Mold Control Maintain humidity below 50%. Clean washable surfaces with 5% bleach solution. Remove sources e.g. Contaminated carpets.   Control of Dust Mite Allergen Dust mites  play a major role in allergic asthma and rhinitis. They occur in environments with high humidity wherever human skin is found. Dust mites absorb humidity from the atmosphere (ie, they do not drink) and feed on organic matter (including shed human and animal skin). Dust mites are a microscopic type of insect that you cannot see with the naked eye. High levels of dust mites have been detected from mattresses, pillows, carpets, upholstered furniture, bed covers, clothes, soft toys and any woven material. The principal allergen of the dust mite is found in its feces. A gram of dust may contain 1,000 mites and 250,000 fecal particles. Mite antigen is easily measured in the air during house cleaning activities. Dust mites do not bite and do not cause harm to humans, other than by triggering allergies/asthma.  Ways to decrease your exposure to dust mites in your home:  1. Encase mattresses, box springs and pillows with a mite-impermeable barrier or cover  2. Wash sheets, blankets and drapes weekly in hot water (130 F) with detergent and dry them in a dryer on the hot setting.  3. Have the room cleaned frequently with a vacuum cleaner and a damp dust-mop. For carpeting or rugs, vacuuming with a vacuum cleaner equipped with a high-efficiency particulate air (HEPA) filter. The dust mite allergic individual should not be in a room which is being cleaned and should wait 1 hour after cleaning before going into the room.  4. Do not sleep on upholstered furniture (eg, couches).  5. If possible removing carpeting, upholstered furniture and drapery from the home is ideal. Horizontal blinds should be eliminated in the rooms where the person spends the most time (bedroom, study, television room). Washable vinyl, roller-type shades are optimal.  6. Remove all non-washable stuffed toys from the bedroom. Wash stuffed toys weekly like sheets and blankets above.  7. Reduce indoor humidity to less than 50%. Inexpensive  humidity monitors can be purchased at most hardware stores. Do not use a humidifier as can make the problem worse and are not recommended.

## 2023-01-01 NOTE — Progress Notes (Signed)
RE: Shannon Huerta MRN: [id number] DOB: 1990-11-16 Date of Telemedicine Visit: 01/01/2023  Referring provider: Arnette Felts, FNP Primary care provider: Arnette Felts, FNP  Chief Complaint: Urticaria   Telemedicine Follow Up Visit via Telephone: I connected with Shannon Huerta for a follow up on 01/01/23 by telephone and verified that I am speaking with the correct person using two identifiers.   I discussed the limitations, risks, security and privacy concerns of performing an evaluation and management service by telephone and the availability of in person appointments. I also discussed with the patient that there may be a patient responsible charge related to this service. The patient expressed understanding and agreed to proceed.  Patient is at home  Provider is at the office.  Visit start time: 935 Visit end time: 2 Insurance consent/check in by: Vernona Rieger Medical consent and medical assistant/nurse: Lyla Son  History of Present Illness: She is a 32 y.o. female, who is being followed for urticaria, angioedema, asthma, allergic rhinitis, and reflux. Her previous allergy office visit was on 08/14/2022 with Thermon Leyland, FNP. Chart review indicates that she has received a steroid taper 2 times for hive control since that visit.   At today's visit, she reports that on May 5th she used an emergency contraceptive and immediately developed hives. She denies concomitant cardiopulmonary or gastrointestinal symptoms with these hives.  She continues levocetirizine 5 mg twice a day, famotidine 20 mg twice a day, montelukast 10 mg once a day, and hydroxyzine 25 mg once at bedtime.  She reports that the prednisone clears the hives and when the prednisone is gone the hives return.  She continues Xolair 300 mg once every 2 weeks, however, there has been an issue with ordering or receiving this medication. She has tried Plaquenil which caused a adverse reaction affecting her teeth and she has also tried cyclosporin  with no improvement in symptoms.  She is currently scheduled to receive her next Xolair injection on 01/03/2023 in the [facility] clinic.  Asthma is reported as well-controlled with no shortness of breath, cough, or wheeze with activity or rest.  She continues Advair 100-1 puff once a day and rarely uses albuterol for relief of symptoms.  Allergic rhinitis is reported as well-controlled with no current symptoms.  She continues Xyzal as listed above and rarely uses azelastine.  Reflux is reported as moderately well-controlled with only occasional heartburn.  She continues omeprazole 20 mg once a day as well as famotidine 20 mg twice a day.  Her current medications are listed in the chart.  Assessment and Plan: Shannon Huerta is a 32 y.o. female with: Patient Instructions  Hives Begin prednisone 10 mg tablets. Take 2 tablets twice a day for 5 days, then take 2 tablets once a day for 5 days, then take 1 tablet once a day for 2 days, then stop Use the least amount of medications possible while remaining hive free Levocetirizine (Xyzal) 5 mg twice a day and famotidine (Pepcid) 20 mg twice a day. If no symptoms for 7-14 days then decrease to. Levocetirizine (Xyzal) 5 mg twice a day and famotidine (Pepcid) 20 mg once a day.  If no symptoms for 7-14 days then decrease to. Levocetirizine (Xyzal) 5 mg twice a day.  If no symptoms for 7-14 days then decrease to. Levocetirizine (Xyzal) 5 mg once a day.  Continue montelukast 10 mg once a day Continue hydroxyzine 25 mg once a day at bedtime as needed for itch Continue Xolair 300 mg once every 2 weeks  and have access to an epinephrine autoinjector set  Angioedema Associated angioedema occurs in up to 50% of patients with chronic urticaria.  Continue with the treatment plan as listed above  Asthma Begin Advair 100-1 puff twice a day to control asthma Continue albuterol 2 puffs once every 4 hours as needed for cough or wheeze You may use albuterol 2 puffs 5 to 15  minutes before activity to decrease cough or wheeze  Allergic rhinitis Continue allergen avoidance measures directed toward grass pollen, mold, and dust mite as listed below  Continue Xyzal 5 mg once a day as needed for runny nose or itch Continue azelastine nasal spray 1 to 2 sprays per nostril 2 times a day if needed Consider saline nasal rinses as needed for nasal symptoms. Use this before any medicated nasal sprays for best result  Reflux Continue dietary lifestyle modifications as listed below Begin omeprazole 20 mg once a day to control reflux. Take this medication 30 minutes before your first meal for best results  Call the clinic if this treatment plan is not working well for you.  Follow up in the clinic with Dr. Maurine Minister in 2 weeks or sooner if needed.  Return in about 2 weeks (around 01/15/2023), or if symptoms worsen or fail to improve.  Meds ordered this encounter  Medications   predniSONE (DELTASONE) 10 MG tablet    Sig: Prednisone 10 mg tablets. Take 2 tablets once a day for 4 days, then take 1 tablet on the 5th day, then stop.    Dispense:  9 tablet    Refill:  0    Medication List:  Current Outpatient Medications  Medication Sig Dispense Refill   albuterol (VENTOLIN HFA) 108 (90 Base) MCG/ACT inhaler Inhale 2 puffs into the lungs every 6 (six) hours as needed for wheezing or shortness of breath. 8 g 2   azelastine (ASTELIN) 0.1 % nasal spray Place 1-2 sprays into both nostrils 2 (two) times daily as needed for rhinitis. 30 mL 5   EPINEPHrine (EPIPEN 2-PAK) 0.3 mg/0.3 mL IJ SOAJ injection Inject 0.3 mg into the muscle as needed for anaphylaxis. 2 each 2   famotidine (PEPCID) 20 MG tablet Take 1 tablet (20 mg total) by mouth 2 (two) times daily. 60 tablet 5   hydrOXYzine (ATARAX) 25 MG tablet Take 25 mg by mouth daily.     levocetirizine (XYZAL) 5 MG tablet Take 1 tablet daily, can increase to 2 tablets twice daily as needed for hives.  This is maximum dose.  Do not take  with other antihistamines such as hydroxyzine. 120 tablet 5   meclizine (ANTIVERT) 12.5 MG tablet Take 1 tablet (12.5 mg total) by mouth 3 (three) times daily as needed for dizziness. 30 tablet 0   montelukast (SINGULAIR) 10 MG tablet TAKE 1 TABLET(10 MG) BY MOUTH AT BEDTIME 30 tablet 3   omalizumab (XOLAIR) 150 MG/ML prefilled syringe Inject 300 mg into the skin every 14 (fourteen) days. 12 mL 3   predniSONE (DELTASONE) 10 MG tablet Prednisone 10 mg tablets. Take 2 tablets once a day for 4 days, then take 1 tablet on the 5th day, then stop. 9 tablet 0   cycloSPORINE (SANDIMMUNE) 25 MG capsule Take 1 capsule (25 mg total) by mouth 2 (two) times daily. (Patient not taking: Reported on 01/01/2023) 60 capsule 3   fluticasone-salmeterol (ADVAIR) 100-50 MCG/ACT AEPB Inhale into the lungs. (Patient not taking: Reported on 01/01/2023)     Current Facility-Administered Medications  Medication Dose Route  Frequency Provider Last Rate Last Admin   omalizumab Geoffry Paradise) prefilled syringe 300 mg  300 mg Subcutaneous Q14 Days Verlee Monte, MD   300 mg at 12/19/22 1610   Allergies: No Known Allergies I reviewed her past medical history, social history, family history, and environmental history and no significant changes have been reported from previous visit on 08/14/2022.   Objective: Physical Exam Not obtained as encounter was done via telephone.   Previous notes and tests were reviewed.  I discussed the assessment and treatment plan with the patient. The patient was provided an opportunity to ask questions and all were answered. The patient agreed with the plan and demonstrated an understanding of the instructions.   The patient was advised to call back or seek an in-person evaluation if the symptoms worsen or if the condition fails to improve as anticipated.  I provided 20 minutes of non-face-to-face time during this encounter.  It was my pleasure to participate in [facility] care today. Please feel  free to contact me with any questions or concerns.   Sincerely,  Thermon Leyland, FNP

## 2023-01-03 ENCOUNTER — Ambulatory Visit (INDEPENDENT_AMBULATORY_CARE_PROVIDER_SITE_OTHER)

## 2023-01-03 DIAGNOSIS — L501 Idiopathic urticaria: Secondary | ICD-10-CM

## 2023-01-09 ENCOUNTER — Ambulatory Visit

## 2023-01-17 ENCOUNTER — Ambulatory Visit

## 2023-01-29 ENCOUNTER — Ambulatory Visit (INDEPENDENT_AMBULATORY_CARE_PROVIDER_SITE_OTHER): Admitting: *Deleted

## 2023-01-29 DIAGNOSIS — L501 Idiopathic urticaria: Secondary | ICD-10-CM

## 2023-02-20 ENCOUNTER — Ambulatory Visit (INDEPENDENT_AMBULATORY_CARE_PROVIDER_SITE_OTHER): Admitting: *Deleted

## 2023-02-20 DIAGNOSIS — L501 Idiopathic urticaria: Secondary | ICD-10-CM

## 2023-02-25 ENCOUNTER — Other Ambulatory Visit: Payer: Self-pay | Admitting: Nurse Practitioner

## 2023-03-04 ENCOUNTER — Other Ambulatory Visit: Payer: Self-pay | Admitting: Family Medicine

## 2023-03-05 ENCOUNTER — Encounter: Payer: Self-pay | Admitting: Internal Medicine

## 2023-03-05 ENCOUNTER — Other Ambulatory Visit: Payer: Self-pay

## 2023-03-05 MED ORDER — PREDNISONE 10 MG PO TABS: ORAL_TABLET | ORAL | 0 refills | Status: DC

## 2023-03-05 NOTE — Telephone Encounter (Signed)
We can call in some prednisone but she needs to be taking levocetirizine 10mg  twice day.  And pepcid 20mg  twice daily, singulair 10mg  daily.   She also needs to make an in person appointment because this is her 2nd flares this past year and we should discuss cyclosprorine.    Start prednisone taper. Prednisone 10mg  tablets - take 2 tablets for 4 days then 1 tablet on day 5.

## 2023-03-07 ENCOUNTER — Other Ambulatory Visit: Payer: Self-pay

## 2023-03-07 ENCOUNTER — Ambulatory Visit (INDEPENDENT_AMBULATORY_CARE_PROVIDER_SITE_OTHER)

## 2023-03-07 DIAGNOSIS — L501 Idiopathic urticaria: Secondary | ICD-10-CM

## 2023-03-07 MED ORDER — HYDROXYZINE HCL 10 MG PO TABS: 10.0000 mg | ORAL_TABLET | Freq: Every evening | ORAL | 0 refills | Status: DC

## 2023-03-07 NOTE — Telephone Encounter (Signed)
Sent in hydroxyzine waiting on pt to call back about appt

## 2023-03-10 ENCOUNTER — Inpatient Hospital Stay (HOSPITAL_BASED_OUTPATIENT_CLINIC_OR_DEPARTMENT_OTHER): Admission: RE | Admit: 2023-03-10 | Source: Ambulatory Visit | Admitting: Radiology

## 2023-03-10 DIAGNOSIS — Z1231 Encounter for screening mammogram for malignant neoplasm of breast: Secondary | ICD-10-CM

## 2023-03-19 ENCOUNTER — Encounter (HOSPITAL_BASED_OUTPATIENT_CLINIC_OR_DEPARTMENT_OTHER): Payer: Self-pay | Admitting: Family Medicine

## 2023-03-19 ENCOUNTER — Ambulatory Visit (INDEPENDENT_AMBULATORY_CARE_PROVIDER_SITE_OTHER): Admitting: Family Medicine

## 2023-03-19 VITALS — BP 122/90 | HR 78 | Ht 66.0 in | Wt 181.0 lb

## 2023-03-19 DIAGNOSIS — R5383 Other fatigue: Secondary | ICD-10-CM | POA: Diagnosis not present

## 2023-03-19 DIAGNOSIS — R599 Enlarged lymph nodes, unspecified: Secondary | ICD-10-CM | POA: Diagnosis not present

## 2023-03-19 DIAGNOSIS — L508 Other urticaria: Secondary | ICD-10-CM

## 2023-03-19 DIAGNOSIS — E042 Nontoxic multinodular goiter: Secondary | ICD-10-CM | POA: Diagnosis not present

## 2023-03-19 DIAGNOSIS — Z7689 Persons encountering health services in other specified circumstances: Secondary | ICD-10-CM

## 2023-03-19 NOTE — Progress Notes (Signed)
New Patient Office Visit  Subjective    Patient ID: Shannon Huerta, female    DOB: 07/15/1991  Age: 32 y.o. MRN: [id number]  CC:  Chief Complaint  Patient presents with   New Patient (Initial Visit)    Pt has had pain in her necks for about 2 weeks and states she noticed she has two lumps on her neck. States she has a history of thyroid nodules.    HPI Shannon Huerta is a 32 year-old female who presents to establish care. Past medical history includes connective tissue disease (unable to specifically diagnose), chronic urticaria, angioedema, asthma, allergic rhinitis, HS, and reflux. She is established with an allergist and has been seeing   Last week, her neck was hurting and noticed small nodule on L side and became more visible. Neck pain hurts all the time- constant burning pain. Another nodule on R side. She has been doing intermittent fasting.  Had ultrasound of her thyroid for nodules- no past history of thyroid issues  Does have seasonal allergies but no recent flare ups  Denies being sick recently or sick contacts  Chronic urticaria: Albuterol- has not taken that in a while.  Xolair injection Q14 days  Hydroxyzine at bedtime for chronic urticaria- sees allergist  Xyzal twice daily as needed   Thyroid nodules: US thyroid 05/16/2021- 2 nodules noted  #1 left, superior  #2 left, inferior- nodule met criteria for surveillance [place] Q1 year x5 years   Outpatient Encounter Medications as of 03/19/2023  Medication Sig   albuterol (VENTOLIN HFA) 108 (90 Base) MCG/ACT inhaler Inhale 2 puffs into the lungs every 6 (six) hours as needed for wheezing or shortness of breath.   azelastine (ASTELIN) 0.1 % nasal spray Place 1-2 sprays into both nostrils 2 (two) times daily as needed for rhinitis.   EPINEPHrine (EPIPEN 2-PAK) 0.3 mg/0.3 mL IJ SOAJ injection Inject 0.3 mg into the muscle as needed for anaphylaxis.   famotidine (PEPCID) 20 MG tablet Take 1 tablet (20 mg total) by mouth 2 (two)  times daily.   hydrOXYzine (ATARAX) 10 MG tablet Take 1 tablet (10 mg total) by mouth at bedtime.   levocetirizine (XYZAL) 5 MG tablet Take 1 tablet daily, can increase to 2 tablets twice daily as needed for hives.  This is maximum dose.  Do not take with other antihistamines such as hydroxyzine.   meclizine (ANTIVERT) 12.5 MG tablet Take 1 tablet (12.5 mg total) by mouth 3 (three) times daily as needed for dizziness.   montelukast (SINGULAIR) 10 MG tablet TAKE 1 TABLET(10 MG) BY MOUTH AT BEDTIME   omalizumab (XOLAIR) 150 MG/ML prefilled syringe Inject 300 mg into the skin every 14 (fourteen) days.   fluticasone-salmeterol (ADVAIR) 100-50 MCG/ACT AEPB Inhale into the lungs. (Patient not taking: Reported on 01/01/2023)   [DISCONTINUED] cycloSPORINE (SANDIMMUNE) 25 MG capsule Take 1 capsule (25 mg total) by mouth 2 (two) times daily. (Patient not taking: Reported on 01/01/2023)   [DISCONTINUED] hydrOXYzine (ATARAX) 25 MG tablet Take 25 mg by mouth daily.   [DISCONTINUED] predniSONE (DELTASONE) 10 MG tablet Prednisone 10 mg tablets. Take 2 tablets once a day for 4 days, then take 1 tablet on the 5th day, then stop.   [DISCONTINUED] predniSONE (DELTASONE) 10 MG tablet 2 tablets for 4 days then 1 tablet on day 5 then stop   Facility-Administered Encounter Medications as of 03/19/2023  Medication   omalizumab Geoffry Paradise) prefilled syringe 300 mg    Past Medical History:  Diagnosis Date  Angio-edema    Anxiety 2012   Asthma    Connective tissue disease (HCC)    Urticaria     History reviewed. No pertinent surgical history.  Family History  Problem Relation Age of Onset   Lupus Mother    Asthma Mother    Urticaria Father    Healthy Father    Lupus Maternal Uncle     Review of Systems  Constitutional:  Positive for malaise/fatigue (increased in past 2 weeks). Negative for chills, fever and weight loss.  HENT:  Negative for congestion, hearing loss and tinnitus.   Eyes:  Negative for blurred  vision, double vision, discharge and redness.  Respiratory:  Negative for cough, shortness of breath and stridor.   Cardiovascular:  Negative for chest pain, palpitations and leg swelling.  Gastrointestinal:  Negative for abdominal pain, nausea and vomiting.  Musculoskeletal:  Positive for neck pain. Negative for myalgias.  Neurological:  Negative for dizziness, weakness and headaches.  Endo/Heme/Allergies:  Positive for environmental allergies.     Objective    BP (!) 122/90 (BP Location: Left Arm, Patient Position: Sitting, Cuff Size: Normal)   Pulse 78   Ht 5\' 6"  (1.676 m)   Wt 181 lb (82.1 kg)   LMP 02/28/2023 (Exact Date)   SpO2 99%   BMI 29.21 kg/m   Physical Exam Constitutional:      Appearance: Normal appearance.  Neck:      Comments: R sized nodule is larger than L sided nodule- tenderness to palpation  Cardiovascular:     Rate and Rhythm: Normal rate and regular rhythm.     Pulses: Normal pulses.     Heart sounds: Normal heart sounds.  Pulmonary:     Effort: Pulmonary effort is normal.     Breath sounds: Normal breath sounds.  Musculoskeletal:     Cervical back: No edema or erythema. Pain with movement present.  Lymphadenopathy:     Head:     Right side of head: No submental, submandibular, tonsillar, preauricular, posterior auricular or occipital adenopathy.     Left side of head: No submental, submandibular, tonsillar, preauricular, posterior auricular or occipital adenopathy.     Cervical: Cervical adenopathy present.     Right cervical: Superficial cervical adenopathy present. No deep or posterior cervical adenopathy.    Left cervical: Superficial cervical adenopathy present. No deep or posterior cervical adenopathy.     Upper Body:     Right upper body: No supraclavicular adenopathy.     Left upper body: No supraclavicular adenopathy.  Neurological:     Mental Status: She is alert.  Psychiatric:        Mood and Affect: Mood normal.        Behavior:  Behavior normal.        Thought Content: Thought content normal.        Judgment: Judgment normal.    Assessment & Plan:   1. Encounter to establish care Patient is a 32 year-old female who presents today to establish care. Reviewed the past medical history, family history, social history, surgical history, medications and allergies today- updates made as indicated. Patient presents with concerns of two lumps on the left and right side of her neck that developed over the past two weeks.   2. Fatigue, unspecified type Patient presents today with concerns for fatigue. No red flags present. Will update CBC, TSH, and CMP today to rule out infectious process, acute anemia, hypothyroidism, liver or kidney disorders, or electrolyte abnormalities.  - CBC  with Differential/Platelet - TSH Rfx on Abnormal to Free T4 - Comprehensive metabolic panel  3. Multiple thyroid nodules Previous US performed on 05/16/2021, due to palpable thyroid mass, with two nodules noted. Nodule #1 isoechoic and does not meet criteria for surveillance or biopsy. Nodule #2 hypoechoic and meets criteria for surveillance to be done annually for a 5-year period. Ultrasound of thyroid ordered today.  - US THYROID  4. Enlarged lymph nodes Patient reports noticing enlarged lumps on either side of her neck that became more noticeable about two weeks ago. She reports tenderness to palpation and a burning sensation during any neck movement. She denies recent sick contacts, allergy exacerbation, and change to medications. She reports the only lifestyle change she has made is participating in intermittent fasting. Physical exam remarkable for tender large R-sided enlarged superficial cervical lymph node and smaller L-sided enlarged superficial cervical lymph node. Will obtain CBC to see if there is an acute infection present and ultrasound to confirm these are enlarged lymph nodes.   - US Soft Tissue Head/Neck (NON-THYROID)  5. Chronic  autoimmune urticaria Patient is followed by [facility], with most recent appt on 01/01/2023. From January to May, she received a steroid taper twice for hive control. Patient is currently taking levocetirizine 5mg  twice daily, famotidine 20mg  twice daily, motelukast 10mg  once daily, and hydroxyzine 25mg  once daily at bedtime. She continues Xolair 300mg  once Q2 weeks. She has tried Plaquenil which caused an adverse reaction to her teeth and has also tried cyclosporin with an exacerbation in her HS symptoms. Advised patient to continue scheduled follow-ups with allergy specialist.    Return if symptoms worsen or fail to improve.   Alyson Reedy, FNP

## 2023-03-21 ENCOUNTER — Other Ambulatory Visit (HOSPITAL_BASED_OUTPATIENT_CLINIC_OR_DEPARTMENT_OTHER): Payer: Self-pay | Admitting: Family Medicine

## 2023-03-21 ENCOUNTER — Ambulatory Visit

## 2023-03-21 ENCOUNTER — Encounter (HOSPITAL_BASED_OUTPATIENT_CLINIC_OR_DEPARTMENT_OTHER): Payer: Self-pay | Admitting: Family Medicine

## 2023-03-22 ENCOUNTER — Ambulatory Visit
Admission: RE | Admit: 2023-03-22 | Discharge: 2023-03-22 | Disposition: A | Discharge status: Home / Self Care | Source: Ambulatory Visit | Attending: Family Medicine | Admitting: Family Medicine

## 2023-03-24 ENCOUNTER — Ambulatory Visit
Admission: EM | Admit: 2023-03-24 | Discharge: 2023-03-24 | Disposition: A | Discharge status: Home / Self Care | Attending: Emergency Medicine | Admitting: Emergency Medicine

## 2023-03-24 DIAGNOSIS — K0889 Other specified disorders of teeth and supporting structures: Secondary | ICD-10-CM | POA: Diagnosis not present

## 2023-03-24 DIAGNOSIS — L509 Urticaria, unspecified: Secondary | ICD-10-CM

## 2023-03-24 DIAGNOSIS — R590 Localized enlarged lymph nodes: Secondary | ICD-10-CM

## 2023-03-24 MED ORDER — PREDNISONE 10 MG (21) PO TBPK: ORAL_TABLET | Freq: Every day | ORAL | 0 refills | Status: DC

## 2023-03-24 MED ORDER — AMOXICILLIN-POT CLAVULANATE 875-125 MG PO TABS: 1.0000 | ORAL_TABLET | Freq: Two times a day (BID) | ORAL | 0 refills | Status: DC

## 2023-03-24 NOTE — Discharge Instructions (Signed)
Symptoms today are most likely related to infection caused by the teeth  On exam there is swelling to the internal left cheek and swelling to the lymph nodes on the left side which are most likely related to your wisdom teeth  You have been placed on antibiotic to clear any infection which is most likely the cause of your symptoms  Begin Augmentin every morning and every evening for 7 days  To prevent reoccurrence she will eventually need to see dentist for removal, may follow-up with them at any point  You have been prescribed prednisone, you have 42 tablets, take however needed for management of your hives  Avoid long exposure to heat to prevent further irritation  Continue use of hydroxyzine as needed for itching  You may follow-up with his urgent care at any point for further evaluation management

## 2023-03-24 NOTE — ED Provider Notes (Signed)
[facility]    CSN: [id number] Arrival date & time: 03/24/23  1017      History   Chief Complaint No chief complaint on file.   HPI Shannon Huerta is a 32 y.o. female.   Patient presents for evaluation of a flare of her chronic [place] area, generalized to the body beginning 1 day ago.  Has attempted use of 10 mg of prednisone and hydroxyzine which has been minimally effective.  Has been experiencing mild pain to the left lower gumline from a wisdom tooth 2 weeks that has been causing intermittent pain for at least 1 year.  Also experiencing pain to the left mastoid and pain to the lateral aspects of the bilateral neck for approximately 2 weeks.  Denies presence of fever but has experienced chills.  Denies presence of drainage.  Tolerating food and liquids.  Denies URI symptoms.  Typically flares are caused by stressful life events and illness.  Taking injections for treatment managed by allergist.  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  OB History   No obstetric history on file.      Home Medications    Prior to Admission medications   Medication Sig Start Date End Date Taking? Authorizing Provider  amoxicillin-clavulanate (AUGMENTIN) 875-125 MG tablet Take 1 tablet by mouth every 12 (twelve) hours. 03/24/23  Yes Gaspard Isbell R, NP  predniSONE (STERAPRED UNI-PAK 21 TAB) 10 MG (21) TBPK tablet Take by mouth daily. Take 6 tabs by mouth  daily  for 2 days, then 5 tabs for 2 days, then 4 tabs for 2 days, then 3 tabs for 2 days, 2 tabs for 2 days, then 1 tab by mouth daily for 2 days 03/24/23  Yes Jadia Capers R, NP  albuterol (VENTOLIN HFA) 108 (90 Base) MCG/ACT inhaler Inhale 2 puffs into the lungs every 6 (six) hours as needed for wheezing or shortness of breath. 04/19/22   Verlee Monte, MD  azelastine (ASTELIN) 0.1 % nasal spray Place 1-2 sprays into both nostrils 2 (two) times daily as needed for rhinitis. 04/19/22   Verlee Monte, MD  EPINEPHrine (EPIPEN 2-PAK) 0.3 mg/0.3 mL IJ SOAJ injection Inject 0.3 mg into the muscle as needed for anaphylaxis. 04/19/22   Verlee Monte, MD  famotidine (PEPCID) 20 MG tablet Take 1 tablet (20 mg total) by mouth 2 (two) times daily. 06/25/22   Verlee Monte, MD  fluticasone-salmeterol (ADVAIR) 100-50 MCG/ACT AEPB Inhale into the lungs. Patient not taking: Reported on 01/01/2023 07/17/22 08/16/22  [provider]  hydrOXYzine (ATARAX) 10 MG tablet Take 1 tablet (10 mg total) by mouth at bedtime. 03/07/23   Verlee Monte, MD  levocetirizine (XYZAL) 5 MG tablet Take 1 tablet daily, can increase to 2 tablets twice daily as needed for hives.  This is maximum dose.  Do not take with other antihistamines such as hydroxyzine. 04/19/22   Verlee Monte, MD  meclizine (  ANTIVERT) 12.5 MG tablet Take 1 tablet (12.5 mg total) by mouth 3 (three) times daily as needed for dizziness. 01/29/22   Arnette Felts, FNP  montelukast (SINGULAIR) 10 MG tablet TAKE 1 TABLET(10 MG) BY MOUTH AT BEDTIME 09/10/22   Verlee Monte, MD  omalizumab Geoffry Paradise) 150 MG/ML prefilled syringe Inject 300 mg into the skin every 14 (fourteen) days. 12/26/22   Verlee Monte, MD    Family History Family History  Problem Relation Age of Onset   Lupus Mother    Asthma Mother    Urticaria Father    Healthy Father    Lupus Maternal Uncle     Social History Social History   Tobacco Use   Smoking status: Never    Passive  exposure: Past   Smokeless tobacco: Never  Vaping Use   Vaping status: Never Used  Substance Use Topics   Alcohol use: Yes    Comment: SOCIALLY   Drug use: Never     Allergies   Patient has no known allergies.   Review of Systems Review of Systems   Physical Exam Triage Vital Signs ED Triage Vitals [03/24/23 1057]  Encounter Vitals Group     BP 123/83     Systolic BP Percentile      Diastolic BP Percentile      Pulse Rate 89     Resp 16     Temp 97.8 F (36.6 C)     Temp Source Tympanic     SpO2 100 %     Weight      Height      Head Circumference      Peak Flow      Pain Score      Pain Loc      Pain Education      Exclude from Growth Chart    No data found.  Updated Vital Signs BP 123/83 (BP Location: Left Arm)   Pulse 89   Temp 97.8 F (36.6 C) (Tympanic)   Resp 16   LMP 02/28/2023 (Exact Date)   SpO2 100%   Visual Acuity Right Eye Distance:   Left Eye Distance:   Bilateral Distance:    Right Eye Near:   Left Eye Near:    Bilateral Near:     Physical Exam Constitutional:      Appearance: Normal appearance.  HENT:     Left Ear: Tympanic membrane, ear canal and external ear normal.     Ears:     Comments: Tenderness present to the mastoid of the left ear without ecchymosis drainage, no cyst present    Mouth/Throat:     Comments: Mild swelling to the left internal cheek and mild swelling to the left lower gumline, no dental decay noted nor dental abscess noted, pharynx is clear without obstruction no erythema or exudate noted Eyes:     Extraocular Movements: Extraocular movements intact.  Pulmonary:     Effort: Pulmonary effort is normal.  Lymphadenopathy:     Cervical: Cervical adenopathy present.  Skin:    Comments: Generalized hives to the body  Neurological:     Mental Status: She is alert and oriented to person, place, and time. Mental status is at baseline.      UC Treatments / Results  Labs (all labs ordered are listed, but  only abnormal results are displayed) Labs Reviewed - No data to display  EKG   Radiology No results found.  Procedures Procedures (including critical care time)  Medications Ordered  in UC Medications - No data to display  Initial Impression / Assessment and Plan / UC Course  I have reviewed the triage vital signs and the nursing notes.  Pertinent labs & imaging results that were available during my care of the patient were reviewed by me and considered in my medical decision making (see chart for details).  Urticaria, cervical adenopathy, dental pain  Hives most likely flared due to dental infection, discussed this with patient, Augmentin prescribed recommended Tylenol Motrin, warm compresses, salt water gargles, Listerine gargles for supportive management advised dental follow-up for management of wisdom tooth, prednisone prescribed for treatment of hives as well as continued use of hydroxyzine for pruritus, advised follow-up with urgent care for persisting or worsening symptoms Final Clinical Impressions(s) / UC Diagnoses   Final diagnoses:  Pain, dental  Cervical adenopathy  Urticaria     Discharge Instructions      Symptoms today are most likely related to infection caused by the teeth  On exam there is swelling to the internal left cheek and swelling to the lymph nodes on the left side which are most likely related to your wisdom teeth  You have been placed on antibiotic to clear any infection which is most likely the cause of your symptoms  Begin Augmentin every morning and every evening for 7 days  To prevent reoccurrence she will eventually need to see dentist for removal, may follow-up with them at any point  You have been prescribed prednisone, you have 42 tablets, take however needed for management of your hives  Avoid long exposure to heat to prevent further irritation  Continue use of hydroxyzine as needed for itching  You may follow-up with his urgent  care at any point for further evaluation management   ED Prescriptions     Medication Sig Dispense Auth. Provider   predniSONE (STERAPRED UNI-PAK 21 TAB) 10 MG (21) TBPK tablet Take by mouth daily. Take 6 tabs by mouth daily  for 2 days, then 5 tabs for 2 days, then 4 tabs for 2 days, then 3 tabs for 2 days, 2 tabs for 2 days, then 1 tab by mouth daily for 2 days 42 tablet Kentley Blyden R, NP   amoxicillin-clavulanate (AUGMENTIN) 875-125 MG tablet Take 1 tablet by mouth every 12 (twelve) hours. 14 tablet Marvel Mcphillips, Elita Boone, NP      PDMP not reviewed this encounter.   Valinda Hoar, NP 03/24/23 1133

## 2023-03-24 NOTE — ED Triage Notes (Signed)
Pt triaged by provider  

## 2023-03-25 ENCOUNTER — Other Ambulatory Visit (HOSPITAL_BASED_OUTPATIENT_CLINIC_OR_DEPARTMENT_OTHER): Payer: Self-pay | Admitting: Family Medicine

## 2023-03-25 ENCOUNTER — Ambulatory Visit (INDEPENDENT_AMBULATORY_CARE_PROVIDER_SITE_OTHER)

## 2023-03-25 DIAGNOSIS — R590 Localized enlarged lymph nodes: Secondary | ICD-10-CM

## 2023-03-25 DIAGNOSIS — R591 Generalized enlarged lymph nodes: Secondary | ICD-10-CM

## 2023-03-25 DIAGNOSIS — L501 Idiopathic urticaria: Secondary | ICD-10-CM

## 2023-03-26 ENCOUNTER — Other Ambulatory Visit (HOSPITAL_BASED_OUTPATIENT_CLINIC_OR_DEPARTMENT_OTHER): Payer: Self-pay

## 2023-03-26 ENCOUNTER — Ambulatory Visit
Admit: 2023-03-26 | Discharge: 2023-03-26 | Disposition: A | Discharge status: Home / Self Care | Attending: Family Medicine | Admitting: Family Medicine

## 2023-03-26 ENCOUNTER — Encounter (HOSPITAL_BASED_OUTPATIENT_CLINIC_OR_DEPARTMENT_OTHER): Payer: Self-pay | Admitting: Emergency Medicine

## 2023-03-26 ENCOUNTER — Encounter: Payer: Self-pay | Admitting: Internal Medicine

## 2023-03-26 ENCOUNTER — Emergency Department (HOSPITAL_BASED_OUTPATIENT_CLINIC_OR_DEPARTMENT_OTHER)
Admission: EM | Admit: 2023-03-26 | Discharge: 2023-03-26 | Disposition: A | Discharge status: Home / Self Care | Attending: Emergency Medicine | Admitting: Emergency Medicine

## 2023-03-26 ENCOUNTER — Encounter (HOSPITAL_BASED_OUTPATIENT_CLINIC_OR_DEPARTMENT_OTHER): Payer: Self-pay | Admitting: Family Medicine

## 2023-03-26 ENCOUNTER — Other Ambulatory Visit: Payer: Self-pay

## 2023-03-26 DIAGNOSIS — Z7951 Long term (current) use of inhaled steroids: Secondary | ICD-10-CM | POA: Insufficient documentation

## 2023-03-26 DIAGNOSIS — R000 Tachycardia, unspecified: Secondary | ICD-10-CM | POA: Insufficient documentation

## 2023-03-26 DIAGNOSIS — J45909 Unspecified asthma, uncomplicated: Secondary | ICD-10-CM | POA: Diagnosis not present

## 2023-03-26 DIAGNOSIS — L509 Urticaria, unspecified: Secondary | ICD-10-CM | POA: Insufficient documentation

## 2023-03-26 DIAGNOSIS — R590 Localized enlarged lymph nodes: Secondary | ICD-10-CM

## 2023-03-26 LAB — PREGNANCY, URINE: Preg Test, Ur: NEGATIVE

## 2023-03-26 MED ORDER — KETOROLAC TROMETHAMINE 15 MG/ML IJ SOLN
15.0000 mg | Freq: Once | INTRAMUSCULAR | Status: AC
  Administered 2023-03-26: 15 mg via INTRAVENOUS
  Filled 2023-03-26: qty 1

## 2023-03-26 MED ORDER — DIPHENHYDRAMINE HCL 50 MG/ML IJ SOLN
25.0000 mg | Freq: Once | INTRAMUSCULAR | Status: AC
  Administered 2023-03-26: 25 mg via INTRAVENOUS
  Filled 2023-03-26: qty 1

## 2023-03-26 MED ORDER — IOPAMIDOL (ISOVUE-300) INJECTION 61%
75.0000 mL | Freq: Once | INTRAVENOUS | Status: AC | PRN
  Administered 2023-03-26: 75 mL via INTRAVENOUS

## 2023-03-26 MED ORDER — FAMOTIDINE IN NACL 20-0.9 MG/50ML-% IV SOLN
20.0000 mg | Freq: Once | INTRAVENOUS | Status: AC
  Administered 2023-03-26: 20 mg via INTRAVENOUS
  Filled 2023-03-26: qty 50

## 2023-03-26 MED ORDER — METHYLPREDNISOLONE SODIUM SUCC 125 MG IJ SOLR
125.0000 mg | Freq: Once | INTRAMUSCULAR | Status: AC
  Administered 2023-03-26: 125 mg via INTRAVENOUS
  Filled 2023-03-26: qty 2

## 2023-03-26 NOTE — ED Provider Notes (Signed)
[place] [facility]ENC[facility] DEPART[facility] AT [facility] Provider Note   CSN: [id number] Arrival date & time: 03/26/23  7628     History  Chief Complaint  Patient presents with   Allergic Reaction    Shannon Huerta is a 32 y.o. female.  Patient is a 32 year old female with a past medical history of autoimmune urticaria, connective tissue disease and asthma presenting to the emergency department with urticaria.  The patient states that she has had urticaria for the last few days.  She states that she normally takes 20 mg of prednisone and the urticaria will improve.  She states that she had no improvement and went to urgent care on Sunday and was started on 60 mg of prednisone which she has been taking in addition to her hydroxyzine and Pepcid but has had no improvement.  She states that she feels like the rash is spreading.  She states that her mouth feels dry but denies any tongue or throat swelling.  She reported some mild shortness of breath today.  She denies any nausea, vomiting or diarrhea.  He states that she does follow with an allergist and had an infusion done yesterday.  The history is provided by the patient.  Allergic Reaction      Home Medications Prior to Admission medications   Medication Sig Start Date End Date Taking? Authorizing Provider  albuterol (VENTOLIN HFA) 108 (90 Base) MCG/ACT inhaler Inhale 2 puffs into the lungs every 6 (six) hours as needed for wheezing or shortness of breath. 04/19/22   Verlee Monte, MD  amoxicillin-clavulanate (AUGMENTIN) 875-125 MG tablet Take 1 tablet by mouth every 12 (twelve) hours. 03/24/23   White, Elita Boone, NP  azelastine (ASTELIN) 0.1 % nasal spray Place 1-2 sprays into both nostrils 2 (two) times daily as needed for rhinitis. 04/19/22   Verlee Monte, MD  EPINEPHrine (EPIPEN 2-PAK) 0.3 mg/0.3 mL IJ SOAJ injection Inject 0.3 mg into the muscle as needed for anaphylaxis. 04/19/22   Verlee Monte, MD  famotidine (PEPCID) 20 MG  tablet Take 1 tablet (20 mg total) by mouth 2 (two) times daily. 06/25/22   Verlee Monte, MD  hydrOXYzine (ATARAX) 10 MG tablet Take 1 tablet (10 mg total) by mouth at bedtime. 03/07/23   Verlee Monte, MD  levocetirizine (XYZAL) 5 MG tablet Take 1 tablet daily, can increase to 2 tablets twice daily as needed for hives.  This is maximum dose.  Do not take with other antihistamines such as hydroxyzine. 04/19/22   Verlee Monte, MD  meclizine (ANTIVERT) 12.5 MG tablet Take 1 tablet (12.5 mg total) by mouth 3 (three) times daily as needed for dizziness. 01/29/22   Arnette Felts, FNP  montelukast (SINGULAIR) 10 MG tablet TAKE 1 TABLET(10 MG) BY MOUTH AT BEDTIME 09/10/22   Verlee Monte, MD  omalizumab Geoffry Paradise) 150 MG/ML prefilled syringe Inject 300 mg into the skin every 14 (fourteen) days. 12/26/22   Verlee Monte, MD  predniSONE (STERAPRED UNI-PAK 21 TAB) 10 MG (21) TBPK tablet Take by mouth daily. Take 6 tabs by mouth daily  for 2 days, then 5 tabs for 2 days, then 4 tabs for 2 days, then 3 tabs for 2 days, 2 tabs for 2 days, then 1 tab by mouth daily for 2 days 03/24/23   Valinda Hoar, NP      Allergies    Patient has no known allergies.    Review of Systems   Review of Systems  Physical Exam Updated Vital Signs BP 104/72   Pulse 95   Temp 99 F (37.2 C)   Resp 19   LMP 02/28/2023 (Exact Date)   SpO2 98%  Physical Exam Vitals and nursing note reviewed.  Constitutional:      General: She is not in acute distress.    Appearance: Normal appearance.  HENT:     Head: Normocephalic and atraumatic.     Nose: Nose normal.     Mouth/Throat:     Mouth: Mucous membranes are moist.     Pharynx: Oropharynx is clear.     Comments: No tongue swelling, no swelling to posterior oropharynx Eyes:     Extraocular Movements: Extraocular movements intact.     Conjunctiva/sclera: Conjunctivae normal.  Cardiovascular:     Rate and Rhythm: Normal rate and regular rhythm.     Heart sounds: Normal  heart sounds.  Pulmonary:     Effort: Pulmonary effort is normal.     Breath sounds: Normal breath sounds. No stridor. No wheezing.  Abdominal:     General: Abdomen is flat.     Palpations: Abdomen is soft.     Tenderness: There is no abdominal tenderness.  Musculoskeletal:        General: Normal range of motion.     Cervical back: Normal range of motion.  Skin:    General: Skin is warm and dry.     Findings: Rash (Diffuse urticaria) present.  Neurological:     General: No focal deficit present.     Mental Status: She is alert and oriented to person, place, and time.  Psychiatric:        Mood and Affect: Mood normal.        Behavior: Behavior normal.     ED Results / Procedures / Treatments   Labs (all labs ordered are listed, but only abnormal results are displayed) Labs Reviewed  PREGNANCY, URINE    EKG None  Radiology No results found.  Procedures Procedures    Medications Ordered in ED Medications  diphenhydrAMINE (BENADRYL) injection 25 mg (25 mg Intravenous Given 03/26/23 0935)  methylPREDNISolone sodium succinate (SOLU-MEDROL) 125 mg/2 mL injection 125 mg (125 mg Intravenous Given 03/26/23 0935)  famotidine (PEPCID) IVPB 20 mg premix (0 mg Intravenous Stopped 03/26/23 1020)  ketorolac (TORADOL) 15 MG/ML injection 15 mg (15 mg Intravenous Given 03/26/23 0935)    ED Course/ Medical Decision Making/ A&P Clinical Course as of 03/26/23 1135  Tue Mar 26, 2023  1133 Upon reassessment, patient's urticaria has significantly improved.  She is stable for discharge home with outpatient primary care and allergy follow-up and is given strict return precautions. [VK]    Clinical Course User Index [VK] Rexford Maus, DO                                 Medical Decision Making This patient presents to the ED with chief complaint(s) of urticaria with pertinent past medical history of autoimmune urticaria, connective tissue disorder which further complicates the  presenting complaint. The complaint involves an extensive differential diagnosis and also carries with it a high risk of complications and morbidity.    The differential diagnosis includes allergic reaction, no evidence of anaphylaxis on exam, chronic urticaria  Additional history obtained: Additional history obtained from N/A Records reviewed Primary Care Documents and allergist records  ED Course and Reassessment: Patient's initial arrival to the emergency department she was mildly  tachycardic, heart rate improved to normal on my evaluation in the room and she is in no acute distress.  She does have diffuse urticaria but without any evidence of anaphylaxis and no evidence of airway involvement.  Patient has been taking appropriate medications at home but will be trialed with IV Solu-Medrol, Benadryl and Pepcid and will be given Toradol for pain and will be closely reassessed.  Independent labs interpretation:  The following labs were independently interpreted: Pregnancy Negative  Independent visualization of imaging: - N/A  Consultation: - Consulted or discussed management/test interpretation w/ external professional: N/A  Consideration for admission or further workup: Patient has no emergent conditions requiring admission or further work-up at this time and is stable for discharge home with primary care follow-up  Social Determinants of health: N/A    Amount and/or Complexity of Data Reviewed Labs: ordered.  Risk Prescription drug management.          Final Clinical Impression(s) / ED Diagnoses Final diagnoses:  Urticaria    Rx / DC Orders ED Discharge Orders     None         Rexford Maus, DO 03/26/23 1135

## 2023-03-26 NOTE — Progress Notes (Signed)
RT assessed the Pt and lungs are clear and there is no stridor in her throat. Vital signs are normal.

## 2023-03-26 NOTE — Discharge Instructions (Signed)
You were seen in the emergency department for your hives.  You had no signs of anaphylaxis and we gave you Benadryl, Pepcid and steroid through the IV with improvement of your rash.  You should continue to take the steroid as prescribed by urgent care as well as your home allergy medications including Pepcid and hydroxyzine.  You should follow-up with your primary doctor or your allergist to have your symptoms rechecked and to see if you need any changes to your medication.  You should return to the emergency department if you have tongue or throat swelling, shortness of breath, repetitive vomiting or any other new or concerning symptoms.

## 2023-03-26 NOTE — ED Triage Notes (Signed)
Pt arrives pov, steady gait c/o hives x 4 days. Pt also reports throat irritation, difficulty swallowing. Tx at [facility], denies benadryl pta

## 2023-03-27 ENCOUNTER — Emergency Department (HOSPITAL_BASED_OUTPATIENT_CLINIC_OR_DEPARTMENT_OTHER)
Admission: EM | Admit: 2023-03-27 | Discharge: 2023-03-28 | Disposition: A | Discharge status: Home / Self Care | Attending: Emergency Medicine | Admitting: Emergency Medicine

## 2023-03-27 ENCOUNTER — Encounter (HOSPITAL_BASED_OUTPATIENT_CLINIC_OR_DEPARTMENT_OTHER): Payer: Self-pay | Admitting: Emergency Medicine

## 2023-03-27 ENCOUNTER — Other Ambulatory Visit: Payer: Self-pay

## 2023-03-27 ENCOUNTER — Emergency Department (HOSPITAL_BASED_OUTPATIENT_CLINIC_OR_DEPARTMENT_OTHER)

## 2023-03-27 ENCOUNTER — Telehealth (INDEPENDENT_AMBULATORY_CARE_PROVIDER_SITE_OTHER): Admitting: Family Medicine

## 2023-03-27 ENCOUNTER — Emergency Department (HOSPITAL_BASED_OUTPATIENT_CLINIC_OR_DEPARTMENT_OTHER): Admitting: Radiology

## 2023-03-27 ENCOUNTER — Other Ambulatory Visit

## 2023-03-27 ENCOUNTER — Encounter (HOSPITAL_BASED_OUTPATIENT_CLINIC_OR_DEPARTMENT_OTHER): Payer: Self-pay | Admitting: Family Medicine

## 2023-03-27 VITALS — Ht 66.0 in | Wt 178.0 lb

## 2023-03-27 DIAGNOSIS — T783XXA Angioneurotic edema, initial encounter: Secondary | ICD-10-CM | POA: Diagnosis not present

## 2023-03-27 DIAGNOSIS — J45909 Unspecified asthma, uncomplicated: Secondary | ICD-10-CM | POA: Diagnosis not present

## 2023-03-27 DIAGNOSIS — L508 Other urticaria: Secondary | ICD-10-CM

## 2023-03-27 DIAGNOSIS — F410 Panic disorder [episodic paroxysmal anxiety] without agoraphobia: Secondary | ICD-10-CM | POA: Diagnosis not present

## 2023-03-27 DIAGNOSIS — F5104 Psychophysiologic insomnia: Secondary | ICD-10-CM

## 2023-03-27 DIAGNOSIS — T7840XA Allergy, unspecified, initial encounter: Secondary | ICD-10-CM | POA: Diagnosis present

## 2023-03-27 DIAGNOSIS — F419 Anxiety disorder, unspecified: Secondary | ICD-10-CM

## 2023-03-27 DIAGNOSIS — F430 Acute stress reaction: Secondary | ICD-10-CM

## 2023-03-27 DIAGNOSIS — Z20822 Contact with and (suspected) exposure to covid-19: Secondary | ICD-10-CM | POA: Diagnosis not present

## 2023-03-27 DIAGNOSIS — L500 Allergic urticaria: Secondary | ICD-10-CM

## 2023-03-27 DIAGNOSIS — L509 Urticaria, unspecified: Secondary | ICD-10-CM

## 2023-03-27 LAB — BASIC METABOLIC PANEL
Anion gap: 10 (ref 5–15)
BUN: 14 mg/dL (ref 6–20)
CO2: 24 mmol/L (ref 22–32)
Calcium: 8.3 mg/dL — ABNORMAL LOW (ref 8.9–10.3)
Chloride: 107 mmol/L (ref 98–111)
Creatinine, Ser: 0.77 mg/dL (ref 0.44–1.00)
GFR, Estimated: >60 mL/min (ref >60)
Glucose, Bld: 175 mg/dL — ABNORMAL HIGH (ref 70–99)
Potassium: 3.8 mmol/L (ref 3.5–5.1)
Sodium: 141 mmol/L (ref 135–145)

## 2023-03-27 LAB — GROUP A STREP BY PCR: Group A Strep by PCR: NOT DETECTED

## 2023-03-27 LAB — CBC WITH DIFFERENTIAL/PLATELET
Abs Immature Granulocytes: 0.07 10*3/uL (ref 0.00–0.07)
Basophils Absolute: 0 10*3/uL (ref 0.0–0.1)
Basophils Relative: 0 %
Eosinophils Absolute: 0 10*3/uL (ref 0.0–0.5)
Eosinophils Relative: 0 %
HCT: 37.7 % (ref 36.0–46.0)
Hemoglobin: 12.3 g/dL (ref 12.0–15.0)
Immature Granulocytes: 0 %
Lymphocytes Relative: 5 %
Lymphs Abs: 0.8 10*3/uL (ref 0.7–4.0)
MCH: 24.2 pg — ABNORMAL LOW (ref 26.0–34.0)
MCHC: 32.6 g/dL (ref 30.0–36.0)
MCV: 74.2 fL — ABNORMAL LOW (ref 80.0–100.0)
Monocytes Absolute: 0.2 10*3/uL (ref 0.1–1.0)
Monocytes Relative: 2 %
Neutro Abs: 15.1 10*3/uL — ABNORMAL HIGH (ref 1.7–7.7)
Neutrophils Relative %: 93 %
Platelets: 352 10*3/uL (ref 150–400)
RBC: 5.08 MIL/uL (ref 3.87–5.11)
RDW: 14 % (ref 11.5–15.5)
WBC: 16.2 10*3/uL — ABNORMAL HIGH (ref 4.0–10.5)
nRBC: 0 % (ref 0.0–0.2)

## 2023-03-27 LAB — RESP PANEL BY RT-PCR (RSV, FLU A&B, COVID)  RVPGX2
Influenza A by PCR: NEGATIVE
Influenza B by PCR: NEGATIVE
Resp Syncytial Virus by PCR: NEGATIVE
SARS Coronavirus 2 by RT PCR: NEGATIVE

## 2023-03-27 LAB — HCG, QUANTITATIVE, PREGNANCY: hCG, Beta Chain, Quant, S: <1 m[IU]/mL (ref <5)

## 2023-03-27 MED ORDER — TRAZODONE HCL 50 MG PO TABS
25.0000 mg | ORAL_TABLET | Freq: Every day | ORAL | 2 refills | Status: DC
Start: 2023-03-27 — End: 2023-07-18

## 2023-03-27 MED ORDER — METHYLPREDNISOLONE 4 MG PO TBPK
ORAL_TABLET | ORAL | 0 refills | Status: AC
Start: 2023-03-27 — End: ?

## 2023-03-27 MED ORDER — SODIUM CHLORIDE 0.9 % IV BOLUS
1000.0000 mL | Freq: Once | INTRAVENOUS | Status: AC
  Administered 2023-03-27: 1000 mL via INTRAVENOUS

## 2023-03-27 MED ORDER — ALPRAZOLAM 0.25 MG PO TABS
0.2500 mg | ORAL_TABLET | Freq: Two times a day (BID) | ORAL | 0 refills | Status: AC | PRN
Start: 2023-03-27 — End: ?

## 2023-03-27 MED ORDER — KETOROLAC TROMETHAMINE 15 MG/ML IJ SOLN
15.0000 mg | Freq: Once | INTRAMUSCULAR | Status: AC
  Administered 2023-03-27: 15 mg via INTRAVENOUS
  Filled 2023-03-27: qty 1

## 2023-03-27 MED ORDER — EPINEPHRINE 0.3 MG/0.3ML IJ SOAJ
0.3000 mg | INTRAMUSCULAR | 2 refills | Status: DC | PRN
Start: 2023-03-27 — End: 2024-06-08

## 2023-03-27 MED ORDER — FAMOTIDINE IN NACL 20-0.9 MG/50ML-% IV SOLN
20.0000 mg | Freq: Once | INTRAVENOUS | Status: AC
  Administered 2023-03-27: 20 mg via INTRAVENOUS
  Filled 2023-03-27: qty 50

## 2023-03-27 MED ORDER — IOHEXOL 300 MG/ML  SOLN
100.0000 mL | Freq: Once | INTRAMUSCULAR | Status: AC | PRN
  Administered 2023-03-27: 75 mL via INTRAVENOUS

## 2023-03-27 MED ORDER — EPINEPHRINE 0.3 MG/0.3ML IJ SOAJ
0.3000 mg | Freq: Once | INTRAMUSCULAR | Status: AC
  Administered 2023-03-27: 0.3 mg via INTRAMUSCULAR
  Filled 2023-03-27: qty 0.3

## 2023-03-27 MED ORDER — DIPHENHYDRAMINE HCL 50 MG/ML IJ SOLN
25.0000 mg | Freq: Once | INTRAMUSCULAR | Status: AC
  Administered 2023-03-27: 25 mg via INTRAVENOUS
  Filled 2023-03-27: qty 1

## 2023-03-27 NOTE — Progress Notes (Signed)
Virtual Visit via Video Note  I connected with Shannon Huerta on 03/27/23 at 12:07 PM by a video enabled telemedicine application and verified that I am speaking with the correct person using two identifiers.  Patient Location: Home Provider Location: Office/Clinic  I discussed the limitations, risks, security, and privacy concerns of performing an evaluation and management service by video and the availability of in person appointments. I also discussed with the patient that there may be a patient responsible charge related to this service. The patient expressed understanding and agreed to proceed.  Subjective: PCP: Alyson Reedy, FNP  Chief Complaint  Patient presents with   Urticaria   Shannon Huerta is a 32 year-old female patient who presents today via video visit to discuss chronic urticaria.  8/18, Sunday- went to UC, rx'd for prednisone taper. Improved- 5 to 6 hours of relief with benadryl, IV methylprednisolone, ketorlac.  "All day Monday was not that bad, but around 8pm body was hurting but could tell hives were forming." Woke up at 2am, face, scalp, ears were covered in hives. Could not walk up stairs- head to toe she was covered in hives.   Decided go to ED on Tuesday 8/20.  Took 3 benadryl-75 mg total prior to bed that night around & 8pm took prednisone 10mg  & 2230 took another 10mg - only slept from 2230-0200. Woke up itching and slightly shob. She reports she is already itching and she knows it is going to get worse. She reports she feels her heart fluttering and is unsure if this is tachycardia.   ROS: Per HPI  Current Outpatient Medications:    albuterol (VENTOLIN HFA) 108 (90 Base) MCG/ACT inhaler, Inhale 2 puffs into the lungs every 6 (six) hours as needed for wheezing or shortness of breath., Disp: 8 g, Rfl: 2   ALPRAZolam (XANAX) 0.25 MG tablet, Take 1-2 tablets (0.25-0.5 mg total) by mouth 2 (two) times daily as needed for anxiety., Disp: 20 tablet, Rfl: 0    amoxicillin-clavulanate (AUGMENTIN) 875-125 MG tablet, Take 1 tablet by mouth every 12 (twelve) hours., Disp: 14 tablet, Rfl: 0   azelastine (ASTELIN) 0.1 % nasal spray, Place 1-2 sprays into both nostrils 2 (two) times daily as needed for rhinitis., Disp: 30 mL, Rfl: 5   EPINEPHrine 0.3 mg/0.3 mL IJ SOAJ injection, Inject 0.3 mg into the muscle as needed for anaphylaxis., Disp: 1 each, Rfl: 2   famotidine (PEPCID) 20 MG tablet, Take 1 tablet (20 mg total) by mouth 2 (two) times daily., Disp: 60 tablet, Rfl: 5   hydrOXYzine (ATARAX) 10 MG tablet, Take 1 tablet (10 mg total) by mouth at bedtime., Disp: 30 tablet, Rfl: 0   levocetirizine (XYZAL) 5 MG tablet, Take 1 tablet daily, can increase to 2 tablets twice daily as needed for hives.  This is maximum dose.  Do not take with other antihistamines such as hydroxyzine., Disp: 120 tablet, Rfl: 5   meclizine (ANTIVERT) 12.5 MG tablet, Take 1 tablet (12.5 mg total) by mouth 3 (three) times daily as needed for dizziness., Disp: 30 tablet, Rfl: 0   methylPREDNISolone (MEDROL DOSEPAK) 4 MG TBPK tablet, Day 1: 24 mg on day 1 administered as 8 mg before breakfast, 4 mg after lunch, 4 mg after supper, and 8 mg at bedtime or 24 mg as a single dose or divided into 2 or 3 doses upon initiation (regardless of time of day). Day 2: 20 mg on day 2 administered as 4 mg before breakfast, 4 mg after  lunch, 4 mg after supper, and 8 mg at bedtime. Day 3: 16 mg on day 3 administered as 4 mg before breakfast, 4 mg after lunch, 4 mg after supper, and 4 mg at bedtime. Day 4: 12 mg on day 4 administered as 4 mg before breakfast, 4 mg after lunch, and 4 mg at bedtime. Day 5: 8 mg on day 5 administered as 4 mg before breakfast and 4 mg at bedtime. Day 6: 4 mg on day 6 administered as 4 mg before breakfast., Disp: 21 each, Rfl: 0   montelukast (SINGULAIR) 10 MG tablet, TAKE 1 TABLET(10 MG) BY MOUTH AT BEDTIME, Disp: 30 tablet, Rfl: 3   omalizumab (XOLAIR) 150 MG/ML prefilled syringe,  Inject 300 mg into the skin every 14 (fourteen) days., Disp: 12 mL, Rfl: 3   predniSONE (STERAPRED UNI-PAK 21 TAB) 10 MG (21) TBPK tablet, Take by mouth daily. Take 6 tabs by mouth daily  for 2 days, then 5 tabs for 2 days, then 4 tabs for 2 days, then 3 tabs for 2 days, 2 tabs for 2 days, then 1 tab by mouth daily for 2 days, Disp: 42 tablet, Rfl: 0   traZODone (DESYREL) 50 MG tablet, Take 0.5-1 tablets (25-50 mg total) by mouth at bedtime., Disp: 30 tablet, Rfl: 2  Current Facility-Administered Medications:    omalizumab Geoffry Paradise) prefilled syringe 300 mg, 300 mg, Subcutaneous, Q14 Days, Verlee Monte, MD, 300 mg at 03/25/23 0946  Observations/Objective: Today's Vitals   03/27/23 1023  Weight: 178 lb (80.7 kg)  Height: 5\' 6"  (1.676 m)    General: Alert and oriented x 4. Speaking in clear and full sentences, no audible heavy breathing, no acute distress.  Sounds alert and appropriately interactive.  Appears well.  Face symmetric.  Extraocular movements intact.  Pupils equal and round.  No nasal flaring or accessory muscle use visualized.  Assessment and Plan: 1. Recurrent urticaria Patient went to [facility] ED 03/26/2023 for urticaria. 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 She reports that she has had improvement with treatment she received while "admitted." She received IV Solu-Medrol, benadryl, pepcid, and toradol with relief for about 6 hours after she was discharged. She is presenting today with similar symptoms prior to her seeking care at the ED. Denies shortness of breath, angioedema, wheezing, and stridor. Patient reports she has been waking up in the early morning (around 0200) with severe itching. Prescribed methylprednisolone taper- directions provided in patient instruction section. Briefly discussed neck CT scan, which noted that it may be related to her connective tissue disease. Advised patient to follow-up with rheumatology. She reports  she has an appointment with [facility] at the end of September. Will place rheum referral to see if she can be seen sooner. Advised patient to try and schedule an appointment with her allergist. Discussed ER precautions and patient verbalized an understanding.  - methylPREDNISolone (MEDROL DOSEPAK) 4 MG TBPK tablet; Dispense: 21 each; Refill: 0 - Ambulatory referral to Rheumatology - EPINEPHrine 0.3 mg/0.3 mL IJ SOAJ injection; Inject 0.3 mg into the muscle as needed for anaphylaxis.  Dispense: 1 each; Refill: 2  2. Psychophysiological insomnia Patient reports she is unable to get adequate sleep with her recurrent urticaria and stress. She reports she has tried trazodone in the past and has had success. She reports she is still waking up, despite use of hydroxyzine PRN. Counseled patient to start with 0.5 tablet about 30-60 minutes prior to bedtime and, if no  relief with half tablet, then advised her to take one tablet.  - traZODone (DESYREL) 50 MG tablet; Take 0.5-1 tablets (25-50 mg total) by mouth at bedtime.  Dispense: 30 tablet; Refill: 2  3. Panic attack due to exceptional stress Patient reports she feels "fluttering of her heart" and reports an increase in anxiety due to her chronic urticaria and frequent exacerbations. She reports she is having a difficult time sleeping at night and is awakening frequently with itching. She reports she does not have relief of anxiety with hydroxyzine. PDMP reviewed. No red flags. Reasonable to use short-term as needed for increased anxiety. Patient reports being on sertraline in the past and may be reasonable to consider this another option.  - ALPRAZolam (XANAX) 0.25 MG tablet; Take 1-2 tablets (0.25-0.5 mg total) by mouth 2 (two) times daily as needed for anxiety.  Dispense: 20 tablet; Refill: 0   Follow Up Instructions: Return if symptoms worsen or fail to improve.   I discussed the assessment and treatment plan with the patient. The patient was provided an  opportunity to ask questions, and all were answered. The patient agreed with the plan and demonstrated an understanding of the instructions.   The patient was advised to call back or seek an in-person evaluation if the symptoms worsen or if the condition fails to improve as anticipated.  The above assessment and management plan was discussed with the patient. The patient verbalized understanding of and has agreed to the management plan.   Alyson Reedy, FNP

## 2023-03-27 NOTE — ED Triage Notes (Signed)
Pt arrives pov, steady gait, c/o hives, Rasied redness noted. Endorses difficulty swallowing. Tx for same yesterday.

## 2023-03-27 NOTE — ED Provider Notes (Incomplete)
[place] EMERGENCY DEPARTMENT AT [facility] Provider Note   CSN: [id number] Arrival date & time: 03/27/23  1714     History  Chief Complaint  Patient presents with   Allergic Reaction    Shannon Huerta is a 32 y.o. female.   Allergic Reaction    32 year old female with medical history significant for chronic idiopathic urticaria, asthma, angioedema who presents to the emergency department with profuse hives, difficulty swallowing.  The patient has been on outpatient steroids and was seen in the Emergency Department for the same complaint yesterday during which time she was treated with IV steroids, IV antihistamines with some improvement and she was subsequently discharged.  She states that she is having persistent hives today.  She feels that her throat is swollen which is more so than normal for her.  She denies any tongue swelling or elevation.  No fevers or chills.  Per recent video visit notes: 8/18, Sunday- went to UC, rx'd for prednisone taper. Improved- 5 to 6 hours of relief with benadryl, IV methylprednisolone, ketorlac.  "All day Monday was not that bad, but around 8pm body was hurting but could tell hives were forming." Woke up at 2am, face, scalp, ears were covered in hives. Could not walk up stairs- head to toe she was covered in hives.    Decided go to ED on Tuesday 8/20.  Took 3 benadryl-75 mg total prior to bed that night around & 8pm took prednisone 10mg  & 2230 took another 10mg - only slept from 2230-0200. Woke up itching and slightly shob. She reports she is already itching and she knows it is going to get worse. She reports she feels her heart fluttering and is unsure if this is tachycardia.   She has had an extensive imaging workup outpatient over the past week to include thyroid US which revealed nodules that warranted no follow-up. She had an US of the neck which showed lymphadenopathy:  US Neck 8/16: FINDINGS: Targeted sonographic evaluation of the  area of concern in the neck demonstrates prominent lymph nodes measuring 0.5 cm on short axis on the right and 0.6 cm on short axis on the left. Although these nodes are not enlarged by imaging criteria, the cortex of these lymph nodes appears somewhat asymmetrically thickened.   IMPRESSION: Palpable abnormalities in the neck correspond to prominent lymph nodes. The cortex of these lymph nodes appear somewhat asymmetrically thickened. Further evaluation with contrast enhanced soft tissue neck CT should be performed to better evaluate these lymph nodes.  CT Neck 8/20: IMPRESSION: Prominent cervical lymph node size but symmetric and similar size when compared with nodes seen on a 03/01/2022 brain MRI, likely reactive. These could be related to patient's chart history of connective tissue disease. Recommend clinical follow-up given palpability.  Home Medications Prior to Admission medications   Medication Sig Start Date End Date Taking? Authorizing Provider  albuterol (VENTOLIN HFA) 108 (90 Base) MCG/ACT inhaler Inhale 2 puffs into the lungs every 6 (six) hours as needed for wheezing or shortness of breath. 04/19/22   Verlee Monte, MD  ALPRAZolam Prudy Feeler) 0.25 MG tablet Take 1-2 tablets (0.25-0.5 mg total) by mouth 2 (two) times daily as needed for anxiety. 03/27/23   Alyson Reedy, FNP  amoxicillin-clavulanate (AUGMENTIN) 875-125 MG tablet Take 1 tablet by mouth every 12 (twelve) hours. 03/24/23   White, Elita Boone, NP  azelastine (ASTELIN) 0.1 % nasal spray Place 1-2 sprays into both nostrils 2 (two) times daily as needed for rhinitis. 04/19/22  Verlee Monte, MD  EPINEPHrine 0.3 mg/0.3 mL IJ SOAJ injection Inject 0.3 mg into the muscle as needed for anaphylaxis. 03/27/23   Alyson Reedy, FNP  famotidine (PEPCID) 20 MG tablet Take 1 tablet (20 mg total) by mouth 2 (two) times daily. 06/25/22   Verlee Monte, MD  hydrOXYzine (ATARAX) 10 MG tablet Take 1 tablet (10 mg total) by mouth  at bedtime. 03/07/23   Verlee Monte, MD  levocetirizine (XYZAL) 5 MG tablet Take 1 tablet daily, can increase to 2 tablets twice daily as needed for hives.  This is maximum dose.  Do not take with other antihistamines such as hydroxyzine. 04/19/22   Verlee Monte, MD  meclizine (ANTIVERT) 12.5 MG tablet Take 1 tablet (12.5 mg total) by mouth 3 (three) times daily as needed for dizziness. 01/29/22   Arnette Felts, FNP  methylPREDNISolone (MEDROL DOSEPAK) 4 MG TBPK tablet Day 1: 24 mg on day 1 administered as 8 mg before breakfast, 4 mg after lunch, 4 mg after supper, and 8 mg at bedtime or 24 mg as a single dose or divided into 2 or 3 doses upon initiation (regardless of time of day). Day 2: 20 mg on day 2 administered as 4 mg before breakfast, 4 mg after lunch, 4 mg after supper, and 8 mg at bedtime. Day 3: 16 mg on day 3 administered as 4 mg before breakfast, 4 mg after lunch, 4 mg after supper, and 4 mg at bedtime. Day 4: 12 mg on day 4 administered as 4 mg before breakfast, 4 mg after lunch, and 4 mg at bedtime. Day 5: 8 mg on day 5 administered as 4 mg before breakfast and 4 mg at bedtime. Day 6: 4 mg on day 6 administered as 4 mg before breakfast. 03/27/23   Alyson Reedy, FNP  montelukast (SINGULAIR) 10 MG tablet TAKE 1 TABLET(10 MG) BY MOUTH AT BEDTIME 09/10/22   Verlee Monte, MD  omalizumab Geoffry Paradise) 150 MG/ML prefilled syringe Inject 300 mg into the skin every 14 (fourteen) days. 12/26/22   Verlee Monte, MD  predniSONE (STERAPRED UNI-PAK 21 TAB) 10 MG (21) TBPK tablet Take by mouth daily. Take 6 tabs by mouth daily  for 2 days, then 5 tabs for 2 days, then 4 tabs for 2 days, then 3 tabs for 2 days, 2 tabs for 2 days, then 1 tab by mouth daily for 2 days 03/24/23   Valinda Hoar, NP  traZODone (DESYREL) 50 MG tablet Take 0.5-1 tablets (25-50 mg total) by mouth at bedtime. 03/27/23   Alyson Reedy, FNP      Allergies    Patient has no known allergies.    Review of Systems   Review of  Systems  All other systems reviewed and are negative.   Physical Exam Updated Vital Signs BP 122/72 (BP Location: Right Arm)   Pulse (!) 102   Temp 98.1 F (36.7 C) (Oral)   Resp 19   Wt 80.7 kg   LMP 03/27/2023 (Exact Date)   SpO2 100%   BMI 28.72 kg/m  Physical Exam Vitals and nursing note reviewed.  Constitutional:      General: She is not in acute distress.    Appearance: She is well-developed.  HENT:     Head: Normocephalic and atraumatic.     Comments: No significant trismus, no tongue elevation, mild posterior oropharyngeal erythema Eyes:     Conjunctiva/sclera: Conjunctivae normal.  Cardiovascular:     Rate and Rhythm:  Normal rate and regular rhythm.  Pulmonary:     Effort: Pulmonary effort is normal. No respiratory distress.     Breath sounds: Normal breath sounds.     Comments: No wheezing rales or rhonchi Abdominal:     Palpations: Abdomen is soft.     Tenderness: There is no abdominal tenderness.  Musculoskeletal:        General: No swelling.     Cervical back: Neck supple.  Skin:    General: Skin is warm and dry.     Capillary Refill: Capillary refill takes less than 2 seconds.     Findings: Rash present.     Comments: Diffuse whole body urticaria noted  Neurological:     Mental Status: She is alert.  Psychiatric:        Mood and Affect: Mood normal.     ED Results / Procedures / Treatments   Labs (all labs ordered are listed, but only abnormal results are displayed) Labs Reviewed  CBC WITH DIFFERENTIAL/PLATELET - Abnormal; Notable for the following components:      Result Value   WBC 16.2 (*)    MCV 74.2 (*)    MCH 24.2 (*)    Neutro Abs 15.1 (*)    All other components within normal limits  BASIC METABOLIC PANEL - Abnormal; Notable for the following components:   Glucose, Bld 175 (*)    Calcium 8.3 (*)    All other components within normal limits  RESP PANEL BY RT-PCR (RSV, FLU A&B, COVID)  RVPGX2  GROUP A STREP BY PCR  HCG,  QUANTITATIVE, PREGNANCY    EKG None  Radiology DG Neck Soft Tissue  Result Date: 03/27/2023 CLINICAL DATA:  Neck swelling, hives, dysphagia EXAM: NECK SOFT TISSUES - 1+ VIEW COMPARISON:  None Available. FINDINGS: There is no evidence of retropharyngeal soft tissue swelling or epiglottic enlargement. The cervical airway is unremarkable and no radio-opaque foreign body identified. IMPRESSION: Negative. Electronically Signed   By: Helyn Numbers M.D.   On: 03/27/2023 22:10   CT SOFT TISSUE NECK W CONTRAST  Result Date: 03/26/2023 CLINICAL DATA:  Neck mass, nonpulsatile. Enlarged lymph nodes by ultrasound EXAM: CT NECK WITH CONTRAST TECHNIQUE: Multidetector CT imaging of the neck was performed using the standard protocol following the bolus administration of intravenous contrast. RADIATION DOSE REDUCTION: This exam was performed according to the departmental dose-optimization program which includes automated exposure control, adjustment of the mA and/or kV according to patient size and/or use of iterative reconstruction technique. CONTRAST:  75mL ISOVUE-300 IOPAMIDOL (ISOVUE-300) INJECTION 61% COMPARISON:  Soft tissue ultrasound from 4 days ago FINDINGS: Pharynx and larynx: No evidence of mass or inflammation. Salivary glands: No inflammation, mass, or stone. Thyroid: Known findings followed by sonography in the left lobe. Lymph nodes: Generous size of cervical lymph nodes which are diffuse and symmetric. A upper left posterior triangle node measures 12 mm in length on coronal reformats (2 adjacent nodes at this level). No nodal heterogeneity or regional inflammation. Vascular: Unremarkable Limited intracranial: Unremarkable Visualized orbits: Unremarkable Mastoids and visualized paranasal sinuses: Clear Skeleton: No acute or aggressive finding. Upper chest: Clear apical lungs IMPRESSION: Prominent cervical lymph node size but symmetric and similar size when compared with nodes seen on a 03/01/2022 brain  MRI, likely reactive. These could be related to patient's chart history of connective tissue disease. Recommend clinical follow-up given palpability. Electronically Signed   By: Tiburcio Pea M.D.   On: 03/26/2023 17:11    Procedures .Critical Care  Performed  by: Ernie Avena, MD Authorized by: Ernie Avena, MD   Critical care provider statement:    Critical care time (minutes):  30   Critical care was time spent personally by me on the following activities:  Development of treatment plan with patient or surrogate, discussions with consultants, evaluation of patient's response to treatment, examination of patient, ordering and review of laboratory studies, ordering and review of radiographic studies, ordering and performing treatments and interventions, pulse oximetry, re-evaluation of patient's condition and review of old charts   Care discussed with: admitting provider       Medications Ordered in ED Medications  methylPREDNISolone sodium succinate (SOLU-MEDROL) 125 mg/2 mL injection 125 mg (has no administration in time range)  diphenhydrAMINE (BENADRYL) injection 25 mg (25 mg Intravenous Given 03/27/23 1848)  famotidine (PEPCID) IVPB 20 mg premix (0 mg Intravenous Stopped 03/27/23 2046)  sodium chloride 0.9 % bolus 1,000 mL (0 mLs Intravenous Stopped 03/27/23 2223)  EPINEPHrine (EPI-PEN) injection 0.3 mg (0.3 mg Intramuscular Given 03/27/23 1841)  ketorolac (TORADOL) 15 MG/ML injection 15 mg (15 mg Intravenous Given 03/27/23 2149)  iohexol (OMNIPAQUE) 300 MG/ML solution 100 mL (75 mLs Intravenous Contrast Given 03/27/23 2230)    ED Course/ Medical Decision Making/ A&P                                 Medical Decision Making Amount and/or Complexity of Data Reviewed Labs: ordered. Radiology: ordered.  Risk Prescription drug management. Decision regarding hospitalization.   32 year old female with medical history significant for chronic idiopathic urticaria, asthma, angioedema  who presents to the emergency department with profuse hives, difficulty swallowing.  The patient has been on outpatient steroids and was seen in the Emergency Department for the same complaint yesterday during which time she was treated with IV steroids, IV antihistamines with some improvement and she was subsequently discharged.  She states that she is having persistent hives today.  She feels that her throat is swollen which is more so than normal for her.  She denies any tongue swelling or elevation.  No fevers or chills.  Per recent video visit notes: 8/18, Sunday- went to UC, rx'd for prednisone taper. Improved- 5 to 6 hours of relief with benadryl, IV methylprednisolone, ketorlac.  "All day Monday was not that bad, but around 8pm body was hurting but could tell hives were forming." Woke up at 2am, face, scalp, ears were covered in hives. Could not walk up stairs- head to toe she was covered in hives.    Decided go to ED on Tuesday 8/20.  Took 3 benadryl-75 mg total prior to bed that night around & 8pm took prednisone 10mg  & 2230 took another 10mg - only slept from 2230-0200. Woke up itching and slightly shob. She reports she is already itching and she knows it is going to get worse. She reports she feels her heart fluttering and is unsure if this is tachycardia.   She has had an extensive imaging workup outpatient over the past week to include thyroid US which revealed nodules that warranted no follow-up. She had an US of the neck which showed lymphadenopathy:  US Neck 8/16: FINDINGS: Targeted sonographic evaluation of the area of concern in the neck demonstrates prominent lymph nodes measuring 0.5 cm on short axis on the right and 0.6 cm on short axis on the left. Although these nodes are not enlarged by imaging criteria, the cortex of these lymph nodes appears  somewhat asymmetrically thickened.   IMPRESSION: Palpable abnormalities in the neck correspond to prominent lymph nodes. The  cortex of these lymph nodes appear somewhat asymmetrically thickened. Further evaluation with contrast enhanced soft tissue neck CT should be performed to better evaluate these lymph nodes.  CT Neck 8/20: IMPRESSION: Prominent cervical lymph node size but symmetric and similar size when compared with nodes seen on a 03/01/2022 brain MRI, likely reactive. These could be related to patient's chart history of connective tissue disease. Recommend clinical follow-up given palpability.  On arrival, the patient was vitally stable, afebrile, hemodynamically stable, saturating well on room air.  Physical exam significant for diffuse whole body urticaria present, no significant trismus, no tongue elevation, range of motion of the neck intact.  Patient presenting with what feels like throat swelling.  Has a history of chronic idiopathic autoimmune urticaria with some history of angioedema.  Has been treated extensively outpatient with a course of steroids, has been taking antihistamines without relief.  Feels persistent swelling in her throat.  Feels like her airway is closing up.  Due to this, epinephrine was administered IM.  The access was obtained and the patient was administered an IV fluid bolus in addition to IV Pepcid and Benadryl.  She was administered IV Toradol for pain control.  Lab evaluation revealed a leukocytosis to 16.2 however the patient has been on steroids outpatient.  A BMP was generally unremarkable other than mild hyperglycemia to 175.  COVID-19 influenza PCR testing was negative as was group A strep.  The patient's hCG was normal.  X-ray imaging of the soft tissues of the neck was negative.  The patient did undergo CT imaging of the neck yesterday with no evidence of infection.  I spoke with Dr. Antionette Char who recommended admission to a tertiary care center that has rheumatology and dermatology.  He declined admission at this time.  Contacted the [facility]. No current ICU  beds or [facility] beds available, pt placed on a wait list.  [facility] closed for transfer after calling the transfer center.   On repeat assessment, the patient feels unchanged, not better or worse. She does feel that this is the worst episode she has had yet.   [facility] transfer center contacted, they are not accepting transfers.   On repeat assessment, the patient avidin served in the emergency department for total of 8 hours.  She feels like her hives symptoms are coming back however this is a chronic process for her for which she follows up outpatient with allergy and immunology.  She was administered IV Solu-Medrol and told to continue taking her oral prednisone.  From a neck swelling standpoint, she feels like her symptoms have significantly improved since she came to the emergency department.  She is tolerating oral intake.  In this setting, I do not think that the patient needs hospital admission or ICU admission.  I provided strict return precautions in the event any significant worsening symptoms and encouraged the patient to follow-up closely with her allergist and immunologist for further outpatient management.  The patient was in agreement with the plan of care.   Final Clinical Impression(s) / ED Diagnoses Final diagnoses:  Urticaria  Angioedema, initial encounter    Rx / DC Orders ED Discharge Orders     None             Ernie Avena, MD 03/28/23 0110

## 2023-03-27 NOTE — Telephone Encounter (Signed)
Hi Carrie,  Did anyone respond to her yesterday? This should have been a priority message to someone working. No unfortunately there is not much more the ER can do. Is she getting her Xolair injections? It looks like she is no longer on cyclosporine. The original plan was for her to continue on cyclosporine with Xolair. Did this ever happen?  She needs a follow-up visit to discuss long term plans. Her current plan is not working.   Sincererly, Tonny Bollman, MD [facility]

## 2023-03-27 NOTE — Patient Instructions (Signed)
Methylprednisolone Instructions:   Day 1: 24 mg on day 1 administered as 8 mg before breakfast, 4 mg after lunch, 4 mg after supper, and 8 mg at bedtime or 24 mg as a single dose or divided into 2 or 3 doses upon initiation (regardless of time of day).  Day 2: 20 mg on day 2 administered as 4 mg before breakfast, 4 mg after lunch, 4 mg after supper, and 8 mg at bedtime.  Day 3: 16 mg on day 3 administered as 4 mg before breakfast, 4 mg after lunch, 4 mg after supper, and 4 mg at bedtime.  Day 4: 12 mg on day 4 administered as 4 mg before breakfast, 4 mg after lunch, and 4 mg at bedtime.  Day 5: 8 mg on day 5 administered as 4 mg before breakfast and 4 mg at bedtime.  Day 6: 4 mg on day 6 administered as 4 mg before breakfast.

## 2023-03-28 ENCOUNTER — Ambulatory Visit: Admitting: Internal Medicine

## 2023-03-28 ENCOUNTER — Other Ambulatory Visit (HOSPITAL_BASED_OUTPATIENT_CLINIC_OR_DEPARTMENT_OTHER): Payer: Self-pay

## 2023-03-28 DIAGNOSIS — L508 Other urticaria: Secondary | ICD-10-CM

## 2023-03-28 DIAGNOSIS — E042 Nontoxic multinodular goiter: Secondary | ICD-10-CM

## 2023-03-28 DIAGNOSIS — L500 Allergic urticaria: Secondary | ICD-10-CM

## 2023-03-28 MED ORDER — METHYLPREDNISOLONE SODIUM SUCC 125 MG IJ SOLR
125.0000 mg | Freq: Once | INTRAMUSCULAR | Status: AC
  Administered 2023-03-28: 125 mg via INTRAVENOUS
  Filled 2023-03-28: qty 2

## 2023-03-28 NOTE — Progress Notes (Unsigned)
FOLLOW UP Date of Service/Encounter:  03/28/23  Subjective:  Shannon Huerta (DOB: 07-Sep-1990) is a 32 y.o. female who [facility] in re-evaluation of the following: urticaria, angioedema, asthma, allergic rhinitis, and reflux  History obtained from: chart review and {Persons; PED relatives w/patient:19415::"patient"}.  For Review, LV was on 01/01/23  with Thermon Leyland, FNP seen for routine follow-up. See below for summary of history and diagnostics.   Therapeutic plans/changes recommended: She was having another hives flare after taking emergency contraceptive.  She was started on prednisone 20 mg for 5 days and then tapered off slowly. ----------------------------------------------------- Pertinent History/Diagnostics:  Urticaria:  She has a history of very hard to control chronic urticaria.  She has been on high-dose Xolair 300 mg every 2 weeks since November 2023.  She has been on and off prednisone for years.  She has 2 children at home both with autism. Cyclosporine has been added in the past, but she did not remain on this for long.  The plan was for her to remain on Xolair with cyclosporine as this has shown effectiveness in several clinical studies.  She has tried and failed Plaquenil in the past but had to stop due to an adverse reaction affecting her teeth. 2021 lab work for hives showed an elevated CU index of 35, negative thyroid antibodies, normal baseline tryptase of 10.1.  Negative alpha gal panel. 2023 ANA +1:320 speckled.  2024 double-stranded DNA negative, lupus inhibitor panel negative, beta-2 glycoprotein negative. Asthma Controlled on Advair 100, 1 puff daily. Reflux Current meds: Omeprazole 20 mg daily, famotidine 20 mg twice daily --------------------------------------------------- Today presents for follow-up. Today is an acute visit for hives flare.  She has had to go to to multiple urgent care and ED visits for hives getting  prednisone without relief.  On chart review it looks like there was a few missed Xolair injections in the period from May to July.  She has received them every 2 weeks for the past 3 injections.  Her most recent injection was 03/25/2023. She is not coming consistently for her follow-up appointments. She does frequently call requesting prednisone which is usually prescribed due to flares of hives.  However we have been consistently talking with her about finding an alternative treatment plan given the known adverse side effects of recurrent systemic glucocorticoids. She was also started on cyclosporine 50 mg twice daily in addition to her Xolair on August 09, 2022.  However had a repeat visit on August 14, 2022, she reported only taking 25 mg twice daily.  At her next follow-up visit in May, she was no longer taking cyclosporine stating that it was not helpful, but unclear how long she tried this medication  Chart Review: ED visit 03/26/2023-generalized urticaria, pregnancy testing negative, treated with IV Solu-Medrol Benadryl and Pepcid as well as Toradol for pain ED visit 03/27/2023-chronic hives difficulty swallowing.  Ultrasound neck showing prominent lymph nodes, CT neck showed prominent lymph nodes similar in size to previous brain MRI from 2023.  Patient treated with Solu-Medrol, Benadryl, Pepcid, epinephrine, Toradol  All medications reviewed by clinical staff and updated in chart. No new pertinent medical or surgical history except as noted in HPI.  ROS: All others negative except as noted per HPI.   Objective:  LMP 03/27/2023 (Exact Date)  There is no height or weight on file to calculate BMI. Physical Exam: General Appearance:  Alert, cooperative, no distress, appears stated age  Head:  Normocephalic, without  obvious abnormality, atraumatic  Eyes:  Conjunctiva clear, EOM's intact  Ears {Blank multiple:19196:a:"***","EACs normal bilaterally","normal TMs bilaterally","ear tubes present  bilaterally without exudate"}  Nose: Nares normal, {Blank multiple:19196:a:"***","hypertrophic turbinates","normal mucosa","no visible anterior polyps","septum midline"}  Throat: Lips, tongue normal; teeth and gums normal, {Blank multiple:19196:a:"***","normal posterior oropharynx","tonsils 2+","tonsils 3+","no tonsillar exudate","+ cobblestoning","surgically absent tonsils"}  Neck: Supple, symmetrical  Lungs:   {Blank multiple:19196:a:"***","clear to auscultation bilaterally","end-expiratory wheezing","wheezing throughout"}, Respirations unlabored, {Blank multiple:19196:a:"***","no coughing","intermittent dry coughing"}  Heart:  {Blank multiple:19196:a:"***","regular rate and rhythm","no murmur"}, Appears well perfused  Extremities: No edema  Skin: {Blank multiple:19196:a:"***","erythematous, dry patches scattered on ***","lichenification on ***","Skin color, texture, turgor normal","no rashes or lesions on visualized portions of skin"}  Neurologic: No gross deficits   Labs:  Lab Orders  No laboratory test(s) ordered today    Spirometry:  Tracings reviewed. Her effort: {Blank single:19197::"Good reproducible efforts.","It was hard to get consistent efforts and there is a question as to whether this reflects a maximal maneuver.","Poor effort, data can not be interpreted.","Variable effort-results affected","effort okay for first attempt at spirometry.","Results not reproducible due to ***"} FVC: ***L FEV1: ***L, ***% predicted FEV1/FVC ratio: ***% Interpretation: {Blank single:19197::"Spirometry consistent with mild obstructive disease","Spirometry consistent with moderate obstructive disease","Spirometry consistent with severe obstructive disease","Spirometry consistent with possible restrictive disease","Spirometry consistent with mixed obstructive and restrictive disease","Spirometry uninterpretable due to technique","Spirometry consistent with normal pattern","No overt abnormalities noted  given today's efforts","Nonobstructive ratio, low FEV1","Nonobstructive ratio, low FEV1, possible restriction"}.  Please see scanned spirometry results for details.  Skin Testing: {Blank single:19197::"Select foods","Environmental allergy panel","Environmental allergy panel and select foods","Food allergy panel","None","Deferred due to recent antihistamines use","deferred due to recent reaction","Pediatric Environmental Allergy Panel","Pediatric Food Panel","Select foods and environmental allergies"}. {Blank single:19197::"Adequate positive and negative controls","Inadequate positive control-testing invalid","Adequate positive and negative controls, dermatographism present, testing difficult to interpret"}. Results discussed with patient/family.   {Blank single:19197::"Allergy testing results were read and interpreted by myself, documented by clinical staff.","Allergy testing results were read by ***,FNP, documented by clinical staff"}  Assessment/Plan   ***  Other: {Blank multiple:19196:a:"***","samples provided of: ***","spacer provided in clinic","nebulizer machine provided in clinic","school forms provided","reviewed spirometry technique","reviewed inhaler technique","allergy injection given in clinic today","biologic given in clinic today"}  Tonny Bollman, MD  [facility]

## 2023-03-28 NOTE — Discharge Instructions (Addendum)
You have been observed in the emergency department for total of 8 hours.  Your throat swelling symptoms resolved and you are tolerating oral intake which is reassuring in the setting of your symptoms.  Your hives are chronic for which you need to follow-up closely with an allergist and immunologist.  If you have any recurrence of throat swelling please return to the emergency department as angioedema can result in an airway emergency.  As you showed significant improvement in the ER, you do not warrant hospital admission at this time.  You have an EpiPen at home if you have significant swelling take an EpiPen and call 911.  Continue to take your outpatient prescribed steroids and continue with outpatient antihistamines.

## 2023-03-29 ENCOUNTER — Ambulatory Visit: Admitting: Family

## 2023-03-29 ENCOUNTER — Other Ambulatory Visit (HOSPITAL_BASED_OUTPATIENT_CLINIC_OR_DEPARTMENT_OTHER): Payer: Self-pay

## 2023-03-29 ENCOUNTER — Telehealth: Payer: Self-pay | Admitting: Family

## 2023-03-29 DIAGNOSIS — L508 Other urticaria: Secondary | ICD-10-CM

## 2023-03-29 NOTE — Telephone Encounter (Signed)
Patient no showed for her follow up appointment.  Nehemiah Settle, FNP [facility]

## 2023-03-31 ENCOUNTER — Encounter (HOSPITAL_BASED_OUTPATIENT_CLINIC_OR_DEPARTMENT_OTHER): Payer: Self-pay | Admitting: Family Medicine

## 2023-04-01 ENCOUNTER — Other Ambulatory Visit (HOSPITAL_BASED_OUTPATIENT_CLINIC_OR_DEPARTMENT_OTHER): Payer: Self-pay | Admitting: Family Medicine

## 2023-04-01 DIAGNOSIS — D561 Beta thalassemia: Secondary | ICD-10-CM

## 2023-04-01 LAB — IRON,TIBC AND FERRITIN PANEL
Ferritin: 274 ng/mL — ABNORMAL HIGH (ref 15–150)
Iron Saturation: 7 % — CL (ref 15–55)
Iron: 18 ug/dL — ABNORMAL LOW (ref 27–159)
Total Iron Binding Capacity: 258 ug/dL (ref 250–450)
UIBC: 240 ug/dL (ref 131–425)

## 2023-04-01 LAB — HGB FRACTIONATION CASCADE
Hgb A2: 5.6 % — ABNORMAL HIGH (ref 1.8–3.2)
Hgb A: 93.7 % — ABNORMAL LOW (ref 96.4–98.8)
Hgb F: 0.7 % (ref 0.0–2.0)
Hgb S: 0 %

## 2023-04-02 ENCOUNTER — Encounter (HOSPITAL_BASED_OUTPATIENT_CLINIC_OR_DEPARTMENT_OTHER): Payer: Self-pay | Admitting: Family Medicine

## 2023-04-02 ENCOUNTER — Other Ambulatory Visit: Payer: Self-pay

## 2023-04-02 ENCOUNTER — Ambulatory Visit (INDEPENDENT_AMBULATORY_CARE_PROVIDER_SITE_OTHER): Admitting: Family Medicine

## 2023-04-02 ENCOUNTER — Other Ambulatory Visit (HOSPITAL_BASED_OUTPATIENT_CLINIC_OR_DEPARTMENT_OTHER): Payer: Self-pay | Admitting: Family Medicine

## 2023-04-02 ENCOUNTER — Encounter (HOSPITAL_BASED_OUTPATIENT_CLINIC_OR_DEPARTMENT_OTHER): Payer: Self-pay

## 2023-04-02 ENCOUNTER — Emergency Department (HOSPITAL_BASED_OUTPATIENT_CLINIC_OR_DEPARTMENT_OTHER)
Admission: EM | Admit: 2023-04-02 | Discharge: 2023-04-03 | Disposition: A | Discharge status: Home / Self Care | Attending: Emergency Medicine | Admitting: Emergency Medicine

## 2023-04-02 VITALS — BP 126/94 | HR 85 | Ht 66.0 in | Wt 180.2 lb

## 2023-04-02 DIAGNOSIS — R42 Dizziness and giddiness: Secondary | ICD-10-CM | POA: Diagnosis present

## 2023-04-02 DIAGNOSIS — Z7951 Long term (current) use of inhaled steroids: Secondary | ICD-10-CM | POA: Diagnosis not present

## 2023-04-02 DIAGNOSIS — R519 Headache, unspecified: Secondary | ICD-10-CM | POA: Diagnosis not present

## 2023-04-02 DIAGNOSIS — J45909 Unspecified asthma, uncomplicated: Secondary | ICD-10-CM | POA: Diagnosis not present

## 2023-04-02 DIAGNOSIS — D561 Beta thalassemia: Secondary | ICD-10-CM | POA: Diagnosis not present

## 2023-04-02 DIAGNOSIS — R002 Palpitations: Secondary | ICD-10-CM

## 2023-04-02 LAB — CBC WITH DIFFERENTIAL/PLATELET
Abs Immature Granulocytes: 0.36 10*3/uL — ABNORMAL HIGH (ref 0.00–0.07)
Basophils Absolute: 0 10*3/uL (ref 0.0–0.1)
Basophils Relative: 0 %
Eosinophils Absolute: 0.2 10*3/uL (ref 0.0–0.5)
Eosinophils Relative: 2 %
HCT: 40.8 % (ref 36.0–46.0)
Hemoglobin: 13.2 g/dL (ref 12.0–15.0)
Immature Granulocytes: 4 %
Lymphocytes Relative: 39 %
Lymphs Abs: 3.6 10*3/uL (ref 0.7–4.0)
MCH: 24.4 pg — ABNORMAL LOW (ref 26.0–34.0)
MCHC: 32.4 g/dL (ref 30.0–36.0)
MCV: 75.3 fL — ABNORMAL LOW (ref 80.0–100.0)
Monocytes Absolute: 0.3 10*3/uL (ref 0.1–1.0)
Monocytes Relative: 4 %
Neutro Abs: 4.7 10*3/uL (ref 1.7–7.7)
Neutrophils Relative %: 51 %
Platelets: 440 10*3/uL — ABNORMAL HIGH (ref 150–400)
RBC: 5.42 MIL/uL — ABNORMAL HIGH (ref 3.87–5.11)
RDW: 14.5 % (ref 11.5–15.5)
WBC: 9.2 10*3/uL (ref 4.0–10.5)
nRBC: 0 % (ref 0.0–0.2)

## 2023-04-02 LAB — COMPREHENSIVE METABOLIC PANEL
ALT: 12 U/L (ref 0–44)
AST: 10 U/L — ABNORMAL LOW (ref 15–41)
Albumin: 4 g/dL (ref 3.5–5.0)
Alkaline Phosphatase: 47 U/L (ref 38–126)
Anion gap: 10 (ref 5–15)
BUN: 15 mg/dL (ref 6–20)
CO2: 28 mmol/L (ref 22–32)
Calcium: 8.8 mg/dL — ABNORMAL LOW (ref 8.9–10.3)
Chloride: 101 mmol/L (ref 98–111)
Creatinine, Ser: 0.94 mg/dL (ref 0.44–1.00)
GFR, Estimated: >60 mL/min (ref >60)
Glucose, Bld: 74 mg/dL (ref 70–99)
Potassium: 3.8 mmol/L (ref 3.5–5.1)
Sodium: 139 mmol/L (ref 135–145)
Total Bilirubin: 0.2 mg/dL — ABNORMAL LOW (ref 0.3–1.2)
Total Protein: 7.4 g/dL (ref 6.5–8.1)

## 2023-04-02 NOTE — Progress Notes (Signed)
Acute Office Visit  Subjective:     Patient ID: Shannon Huerta, female    DOB: 25-Mar-1991, 32 y.o.   MRN: [id number]  Chief Complaint  Patient presents with   Dizziness    Started Friday night got worse Saturday    Headache   Palpitations    Heart palpitations comes and go   Shannon Huerta is a 32 year-old female patient who presents today for concerns about dizziness, headache, and palpitations.   Patient established with me 8/13 with concerns for possible swollen lymph nodes- ultrasound showing asymmetrically thickened neck lymph nodes- recommending CT scan. CT scan done 8/20- possible relation to pt's PMHx of connective tissue disease. Advised patient to follow-up with rheum.   8/18 ED- seen for utricaria, cervical adenopathy, dental pain Prescribed Augmentin, Tylenol/Motrin, prednisone, hydroxyzine   8/20 ED- seen for diffuse urticaria- no improvement with prednsione and hydroxyzine. Also given pepcid.  8/21 virtual visit- trialed methylprednisolone taper and provided patient with epipen. Provided patient with trazodone for sleep and xanax for panic attacks- advised patient not to take these medications together, or along with hydroxyzine.   8/21 ED- labs with leukocytosis however paitent has been on steroids. BMP unremarkable other than mild hyperglycemia. Given epinephrine IM. Also administered IV fluid bolus, IV pepcid and benadryl, and IV toradol. Given IV Solu-Medrol. Patient was encouraged to transfer to [facility] or [facility] for in-patient monitoring but after 8 hours, provider and patient agreed admission not necessary.   8/27- office visit today: patient reports that she feels intoxicated. She reports her reactions are slow, she feels really weak. Turning her head makes her dizzy. Feels like a really bad hangover- has been like this since Sunday.   She reports that she did take trazodone & hydroxyzine together Thursday night- but still woke up at 2am.  Friday morning felt "dazed,"  thought it was due to trazodone. Fatigue started to progress and get worse.  Heart rate- has not checked it at home but she reports it feels like she has an extra beat occasionally and one is "extra hard." She reports she is never sitting down-always busy with her child. Reports this does not feel like a "steroid high."   [facility] rheum appt tomorrow 8/28 She does see [facility] with [place]   Review of Systems  Constitutional:  Negative for chills, fever and malaise/fatigue.  Eyes:  Negative for blurred vision and double vision.  Respiratory:  Negative for cough and shortness of breath.   Cardiovascular:  Positive for palpitations. Negative for chest pain and leg swelling.  Gastrointestinal:  Negative for nausea and vomiting.  Neurological:  Positive for dizziness. Negative for weakness and headaches.  Psychiatric/Behavioral:  Negative for suicidal ideas. The patient is nervous/anxious and has insomnia (difficulty staying asleep).        Objective:    BP (!) 126/94   Pulse 85   Ht 5\' 6"  (1.676 m)   Wt 180 lb 3.2 oz (81.7 kg)   LMP 03/27/2023 (Exact Date)   SpO2 99%   BMI 29.09 kg/m   Physical Exam Constitutional:      Appearance: Normal appearance.  Cardiovascular:     Rate and Rhythm: Normal rate and regular rhythm.     Heart sounds: Normal heart sounds. No murmur heard. Pulmonary:     Effort: Pulmonary effort is normal.     Breath sounds: Normal breath sounds.  Neurological:     Mental Status: She is alert.  Psychiatric:  Mood and Affect: Mood normal.        Speech: Speech normal.        Behavior: Behavior normal.    Assessment & Plan:   1. Dizziness and giddiness Patient presents today for concerns regarding a feeling similar to "alcohol intoxication" and slowed response times. She reports she has been feeling this way on Sunday and has been progressively worsening. She reports also feeling occasional dizziness and dyspnea on exertion. She also reports  feeling fatigued and frequent heart palpitations. Neurological exam unremarkable- no red flags.   2. Heart palpitations Patient presents with slightly elevated diastolic blood pressure. Patient in no acute distress and is well-appearing. She is sitting calmly in exam chair with normal work of breathing. Denies chest pain, shortness of breath at rest, lower extremity edema, vision changes, headaches, current urticaria. Cardiovascular exam with heart regular rate and rhythm. Normal heart sounds, no murmurs present. No lower extremity edema present. Lungs clear to auscultation bilaterally. EKG performed with significant artifact, NSR or NSR with arrhythmia. No acute ST or T wave abnormalities. Discussed with patient concerns regarding CBC with decreased MCV and MCH, critically low iron saturation with increased ferritin, and decreased iron level. Unfortunately, STAT blood work today would result tomorrow and even with an urgent hematology referral , patient would most likely not been seen this week. Advised patient to go to ED or UC. Patient verbalized understanding and plans to go after her husband is done with work. Advised patient if she becomes acutely symptomatic, to go immediately.   3. 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 Hematology referral was placed, and patient is scheduled for 04/13/2023, but feel patient would benefit from urgent evaluation and treatment at this time. Patient agreeable to seek care at UC/ED this evening.   Return if symptoms worsen or fail to improve.  Alyson Reedy, FNP

## 2023-04-02 NOTE — ED Triage Notes (Signed)
Pt to ED from PCP this morning c/o dizziness and HA x 4 days. Pt had blood work on Friday and was told iron is very low and instructed to come to ED. No s/s of acute distress noted in triage. Reports just received referral for hematology but has not been evaluated yet.

## 2023-04-02 NOTE — ED Notes (Signed)
Patient resting quietly in stretcher, respirations even, unlabored, no acute distress noted. Reports dizziness and headache. Awaiting provider evaluation.

## 2023-04-03 ENCOUNTER — Other Ambulatory Visit (HOSPITAL_BASED_OUTPATIENT_CLINIC_OR_DEPARTMENT_OTHER): Payer: Self-pay | Admitting: Family Medicine

## 2023-04-03 ENCOUNTER — Emergency Department (HOSPITAL_BASED_OUTPATIENT_CLINIC_OR_DEPARTMENT_OTHER)

## 2023-04-03 LAB — HCG, QUANTITATIVE, PREGNANCY: hCG, Beta Chain, Quant, S: 1 m[IU]/mL (ref <5)

## 2023-04-03 MED ORDER — FERROUS SULFATE 325 (65 FE) MG PO TABS: 325.0000 mg | ORAL_TABLET | Freq: Every day | ORAL | 1 refills | Status: DC

## 2023-04-03 NOTE — ED Provider Notes (Signed)
[place] EMERGENCY DEPARTMENT AT [facility] Provider Note   CSN: [id number] Arrival date & time: 04/02/23  1931     History  Chief Complaint  Patient presents with   Dizziness   Headache   Abnormal Lab    Shannon Huerta is a 32 y.o. female.  Patient is a 32 year old female with past medical history of urticaria, GERD, beta thalassemia, and asthma.  Patient presenting today with complaints of dizziness, headache that has been worsening over the past 3 days.  She describes a constant feeling of lightheadedness and as if she is going to pass out.  She also describes headache and feels as if her vision is blurry.  She has taken meclizine with little relief.  She was seen at the doctor's office today, then referred here for further evaluation.  She was told that her iron levels were very low.  The history is provided by the patient.       Home Medications Prior to Admission medications   Medication Sig Start Date End Date Taking? Authorizing Provider  albuterol (VENTOLIN HFA) 108 (90 Base) MCG/ACT inhaler Inhale 2 puffs into the lungs every 6 (six) hours as needed for wheezing or shortness of breath. 04/19/22   Verlee Monte, MD  ALPRAZolam Prudy Feeler) 0.25 MG tablet Take 1-2 tablets (0.25-0.5 mg total) by mouth 2 (two) times daily as needed for anxiety. 03/27/23   Alyson Reedy, FNP  amoxicillin-clavulanate (AUGMENTIN) 875-125 MG tablet Take 1 tablet by mouth every 12 (twelve) hours. 03/24/23   White, Elita Boone, NP  azelastine (ASTELIN) 0.1 % nasal spray Place 1-2 sprays into both nostrils 2 (two) times daily as needed for rhinitis. 04/19/22   Verlee Monte, MD  EPINEPHrine 0.3 mg/0.3 mL IJ SOAJ injection Inject 0.3 mg into the muscle as needed for anaphylaxis. 03/27/23   Alyson Reedy, FNP  famotidine (PEPCID) 20 MG tablet Take 1 tablet (20 mg total) by mouth 2 (two) times daily. 06/25/22   Verlee Monte, MD  hydrOXYzine (ATARAX) 10 MG tablet Take 1 tablet (10 mg total) by  mouth at bedtime. 03/07/23   Verlee Monte, MD  levocetirizine (XYZAL) 5 MG tablet Take 1 tablet daily, can increase to 2 tablets twice daily as needed for hives.  This is maximum dose.  Do not take with other antihistamines such as hydroxyzine. 04/19/22   Verlee Monte, MD  meclizine (ANTIVERT) 12.5 MG tablet Take 1 tablet (12.5 mg total) by mouth 3 (three) times daily as needed for dizziness. 01/29/22   Arnette Felts, FNP  methylPREDNISolone (MEDROL DOSEPAK) 4 MG TBPK tablet Day 1: 24 mg on day 1 administered as 8 mg before breakfast, 4 mg after lunch, 4 mg after supper, and 8 mg at bedtime or 24 mg as a single dose or divided into 2 or 3 doses upon initiation (regardless of time of day). Day 2: 20 mg on day 2 administered as 4 mg before breakfast, 4 mg after lunch, 4 mg after supper, and 8 mg at bedtime. Day 3: 16 mg on day 3 administered as 4 mg before breakfast, 4 mg after lunch, 4 mg after supper, and 4 mg at bedtime. Day 4: 12 mg on day 4 administered as 4 mg before breakfast, 4 mg after lunch, and 4 mg at bedtime. Day 5: 8 mg on day 5 administered as 4 mg before breakfast and 4 mg at bedtime. Day 6: 4 mg on day 6 administered as 4 mg before breakfast. 03/27/23  Alyson Reedy, FNP  montelukast (SINGULAIR) 10 MG tablet TAKE 1 TABLET(10 MG) BY MOUTH AT BEDTIME 09/10/22   Verlee Monte, MD  omalizumab Geoffry Paradise) 150 MG/ML prefilled syringe Inject 300 mg into the skin every 14 (fourteen) days. 12/26/22   Verlee Monte, MD  predniSONE (STERAPRED UNI-PAK 21 TAB) 10 MG (21) TBPK tablet Take by mouth daily. Take 6 tabs by mouth daily  for 2 days, then 5 tabs for 2 days, then 4 tabs for 2 days, then 3 tabs for 2 days, 2 tabs for 2 days, then 1 tab by mouth daily for 2 days 03/24/23   Valinda Hoar, NP  traZODone (DESYREL) 50 MG tablet Take 0.5-1 tablets (25-50 mg total) by mouth at bedtime. 03/27/23   Alyson Reedy, FNP      Allergies    Patient has no known allergies.    Review of Systems   Review of  Systems  All other systems reviewed and are negative.   Physical Exam Updated Vital Signs BP (!) 134/90   Pulse 90   Temp 98.4 F (36.9 C) (Oral)   Resp 16   Ht 5\' 6"  (1.676 m)   Wt 81.6 kg   LMP 03/27/2023 (Exact Date)   SpO2 100%   BMI 29.05 kg/m  Physical Exam Vitals and nursing note reviewed.  Constitutional:      General: She is not in acute distress.    Appearance: She is well-developed. She is not diaphoretic.  HENT:     Head: Normocephalic and atraumatic.  Eyes:     General: No visual field deficit.    Extraocular Movements: Extraocular movements intact.     Pupils: Pupils are equal, round, and reactive to light.  Cardiovascular:     Rate and Rhythm: Normal rate and regular rhythm.     Heart sounds: No murmur heard.    No friction rub. No gallop.  Pulmonary:     Effort: Pulmonary effort is normal. No respiratory distress.     Breath sounds: Normal breath sounds. No wheezing.  Abdominal:     General: Bowel sounds are normal. There is no distension.     Palpations: Abdomen is soft.     Tenderness: There is no abdominal tenderness.  Musculoskeletal:        General: Normal range of motion.     Cervical back: Normal range of motion and neck supple.  Skin:    General: Skin is warm and dry.  Neurological:     General: No focal deficit present.     Mental Status: She is alert and oriented to person, place, and time. Mental status is at baseline.     Cranial Nerves: No cranial nerve deficit, dysarthria or facial asymmetry.     Sensory: No sensory deficit.     Motor: No weakness.     Coordination: Coordination normal.     Gait: Gait normal.     ED Results / Procedures / Treatments   Labs (all labs ordered are listed, but only abnormal results are displayed) Labs Reviewed  CBC WITH DIFFERENTIAL/PLATELET - Abnormal; Notable for the following components:      Result Value   RBC 5.42 (*)    MCV 75.3 (*)    MCH 24.4 (*)    Platelets 440 (*)    Abs Immature  Granulocytes 0.36 (*)    All other components within normal limits  COMPREHENSIVE METABOLIC PANEL - Abnormal; Notable for the following components:   Calcium 8.8 (*)  AST 10 (*)    Total Bilirubin 0.2 (*)    All other components within normal limits  HCG, SERUM, QUALITATIVE    EKG None  Radiology No results found.  Procedures Procedures    Medications Ordered in ED Medications - No data to display  ED Course/ Medical Decision Making/ A&P  Patient presenting with headache and dizziness as described in the HPI.  She arrives here with stable vital signs and physical examination which is unremarkable.  Neurologic exam is nonfocal.  Workup initiated including CBC and CMP, both of which are unremarkable.  Pregnancy test is negative.  CT scan of the head obtained showing no acute process.  I am uncertain as to the etiology of this patient's dizziness, however nothing appears emergent.  She has no anemia, normal electrolytes, and CT scan of the head that shows no acute findings.  She did have a low iron level and blood work previously obtained by her primary doctor, however I do not believe this to be related.  I will prescribe iron supplementation and have her follow-up with primary if not improving.  Final Clinical Impression(s) / ED Diagnoses Final diagnoses:  None    Rx / DC Orders ED Discharge Orders     None         Geoffery Lyons, MD 04/03/23 [phone]

## 2023-04-03 NOTE — Discharge Instructions (Addendum)
Begin taking iron as prescribed.  Drink plenty of fluids and get plenty of rest.  Follow-up with your primary doctor if symptoms are not improving in the next few days.

## 2023-04-03 NOTE — ED Notes (Signed)
Reviewed AVS with patient, patient expressed understanding of directions, denies further questions at this time. 

## 2023-04-09 ENCOUNTER — Ambulatory Visit

## 2023-04-12 ENCOUNTER — Other Ambulatory Visit: Payer: Self-pay | Admitting: Internal Medicine

## 2023-04-12 DIAGNOSIS — D539 Nutritional anemia, unspecified: Secondary | ICD-10-CM

## 2023-04-12 LAB — ANEMIA PROFILE A
Iron Saturation: 53 % (ref 15–55)
Iron: 156 ug/dL (ref 27–159)
Total Iron Binding Capacity: 293 ug/dL (ref 250–450)
UIBC: 137 ug/dL (ref 131–425)

## 2023-04-12 LAB — HGB FRACTIONATION CASCADE
Hgb A2: 4 % — ABNORMAL HIGH (ref 1.8–3.2)
Hgb A: 96 % — ABNORMAL LOW (ref 96.4–98.8)
Hgb F: 0 % (ref 0.0–2.0)
Hgb S: 0 %

## 2023-04-12 LAB — SPECIMEN STATUS REPORT

## 2023-04-13 ENCOUNTER — Inpatient Hospital Stay

## 2023-04-13 ENCOUNTER — Inpatient Hospital Stay: Attending: Internal Medicine | Admitting: Internal Medicine

## 2023-04-13 VITALS — BP 123/86 | HR 83 | Temp 98.4°F | Resp 16 | Ht 65.5 in | Wt 181.4 lb

## 2023-04-13 DIAGNOSIS — R5383 Other fatigue: Secondary | ICD-10-CM

## 2023-04-13 DIAGNOSIS — D563 Thalassemia minor: Secondary | ICD-10-CM | POA: Diagnosis not present

## 2023-04-13 DIAGNOSIS — R718 Other abnormality of red blood cells: Secondary | ICD-10-CM | POA: Insufficient documentation

## 2023-04-13 DIAGNOSIS — E611 Iron deficiency: Secondary | ICD-10-CM

## 2023-04-13 DIAGNOSIS — D561 Beta thalassemia: Secondary | ICD-10-CM | POA: Diagnosis not present

## 2023-04-13 DIAGNOSIS — D539 Nutritional anemia, unspecified: Secondary | ICD-10-CM

## 2023-04-13 LAB — CBC WITH DIFFERENTIAL (CANCER CENTER ONLY)
Abs Immature Granulocytes: 0.01 10*3/uL (ref 0.00–0.07)
Basophils Absolute: 0 10*3/uL (ref 0.0–0.1)
Basophils Relative: 0 %
Eosinophils Absolute: 0.1 10*3/uL (ref 0.0–0.5)
Eosinophils Relative: 3 %
HCT: 39.2 % (ref 36.0–46.0)
Hemoglobin: 12.7 g/dL (ref 12.0–15.0)
Immature Granulocytes: 0 %
Lymphocytes Relative: 30 %
Lymphs Abs: 1.5 10*3/uL (ref 0.7–4.0)
MCH: 24.6 pg — ABNORMAL LOW (ref 26.0–34.0)
MCHC: 32.4 g/dL (ref 30.0–36.0)
MCV: 76 fL — ABNORMAL LOW (ref 80.0–100.0)
Monocytes Absolute: 0.3 10*3/uL (ref 0.1–1.0)
Monocytes Relative: 6 %
Neutro Abs: 3.2 10*3/uL (ref 1.7–7.7)
Neutrophils Relative %: 61 %
Platelet Count: 238 10*3/uL (ref 150–400)
RBC: 5.16 MIL/uL — ABNORMAL HIGH (ref 3.87–5.11)
RDW: 14.2 % (ref 11.5–15.5)
WBC Count: 5.2 10*3/uL (ref 4.0–10.5)
nRBC: 0 % (ref 0.0–0.2)

## 2023-04-13 LAB — CMP (CANCER CENTER ONLY)
ALT: 13 U/L (ref 0–44)
AST: 12 U/L — ABNORMAL LOW (ref 15–41)
Albumin: 4.1 g/dL (ref 3.5–5.0)
Alkaline Phosphatase: 54 U/L (ref 38–126)
Anion gap: 6 (ref 5–15)
BUN: 9 mg/dL (ref 6–20)
CO2: 26 mmol/L (ref 22–32)
Calcium: 9 mg/dL (ref 8.9–10.3)
Chloride: 107 mmol/L (ref 98–111)
Creatinine: 0.86 mg/dL (ref 0.44–1.00)
GFR, Estimated: >60 mL/min (ref >60)
Glucose, Bld: 70 mg/dL (ref 70–99)
Potassium: 3.8 mmol/L (ref 3.5–5.1)
Sodium: 139 mmol/L (ref 135–145)
Total Bilirubin: 0.3 mg/dL (ref 0.3–1.2)
Total Protein: 7.4 g/dL (ref 6.5–8.1)

## 2023-04-13 LAB — IRON AND IRON BINDING CAPACITY (CC-WL,HP ONLY)
Iron: 68 ug/dL (ref 28–170)
Saturation Ratios: 19 % (ref 10.4–31.8)
TIBC: 351 ug/dL (ref 250–450)
UIBC: 283 ug/dL (ref 148–442)

## 2023-04-13 LAB — VITAMIN B12: Vitamin B-12: 915 pg/mL — ABNORMAL HIGH (ref 180–914)

## 2023-04-13 LAB — TSH: TSH: 0.896 u[IU]/mL (ref 0.350–4.500)

## 2023-04-13 LAB — FERRITIN: Ferritin: 41 ng/mL (ref 11–307)

## 2023-04-13 LAB — LACTATE DEHYDROGENASE: LDH: 115 U/L (ref 98–192)

## 2023-04-13 LAB — FOLATE: Folate: 16.4 ng/mL (ref >5.9)

## 2023-04-13 MED ORDER — INTEGRA PLUS PO CAPS: 1.0000 | ORAL_CAPSULE | Freq: Every day | ORAL | 3 refills | Status: DC

## 2023-04-13 NOTE — Progress Notes (Signed)
[facility] [phone]   [phone]  CONSULT NOTE  REFERRING PHYSICIAN: Alyson Reedy, FNP  REASON FOR CONSULTATION:  32 years old African-American female with microcytosis  HPI Shannon Huerta is a 32 y.o. female with past medical history significant for angioedema, anxiety, asthma, connective tissue disorder as well as urticaria.  The patient has been complaining of increasing fatigue and weakness for few years.  She was seen by her primary care provider and she had CBC performed on 03/19/2023 and that showed normal hemoglobin of 13.1 and hematocrit 39.9 with MCV of 73% but elevated total red blood cells of 5.44.  She had hemoglobin electrophoresis performed on the same day and that showed evidence for beta thalassemia minor.  The patient also had iron study performed on 03/29/2023 and that showed low serum iron of 18 with iron saturation of 7% but the ferritin level was elevated at 274 consistent with inflammatory process.  She started taking over the counter iron supplement from [place] a week ago.  She is tolerating it fairly well.  She has some mild skin pimples after starting the iron supplements. She was referred to me today for evaluation and recommendation regarding her condition. When seen today she continues to complain of the fatigue and occasional dizzy spells and nausea.  She has no chest pain, shortness of breath, cough or hemoptysis.  She has no vomiting, abdominal pain, diarrhea or constipation.  She denied having any significant weight loss or night sweats. Family history significant for mother with lupus and asthma.  Father had urticaria.  Maternal uncle had lupus. The patient is married and has 2 children age 20 and 40.  She is a stay-at-home mom.  She has no history for smoking and drinks alcohol occasionally and no history of drug abuse.  HPI  Past Medical History:  Diagnosis Date   Angio-edema    Angioedema 01/05/2020   Anxiety 2012    Asthma    Connective tissue disease (HCC)    Recurrent urticaria 01/05/2020   Urticaria     No past surgical history on file.  Family History  Problem Relation Age of Onset   Lupus Mother    Asthma Mother    Urticaria Father    Healthy Father    Lupus Maternal Uncle     Social History Social History   Tobacco Use   Smoking status: Never    Passive exposure: Past   Smokeless tobacco: Never  Vaping Use   Vaping status: Never Used  Substance Use Topics   Alcohol use: Yes    Comment: SOCIALLY   Drug use: Never    No Known Allergies  Current Outpatient Medications  Medication Sig Dispense Refill   albuterol (VENTOLIN HFA) 108 (90 Base) MCG/ACT inhaler Inhale 2 puffs into the lungs every 6 (six) hours as needed for wheezing or shortness of breath. 8 g 2   ALPRAZolam (XANAX) 0.25 MG tablet Take 1-2 tablets (0.25-0.5 mg total) by mouth 2 (two) times daily as needed for anxiety. 20 tablet 0   amoxicillin-clavulanate (AUGMENTIN) 875-125 MG tablet Take 1 tablet by mouth every 12 (twelve) hours. 14 tablet 0   azelastine (ASTELIN) 0.1 % nasal spray Place 1-2 sprays into both nostrils 2 (two) times daily as needed for rhinitis. 30 mL 5   EPINEPHrine 0.3 mg/0.3 mL IJ SOAJ injection Inject 0.3 mg into the muscle as needed for anaphylaxis. 1 each 2   famotidine (PEPCID) 20 MG tablet Take 1  tablet (20 mg total) by mouth 2 (two) times daily. 60 tablet 5   ferrous sulfate 325 (65 FE) MG tablet Take 1 tablet (325 mg total) by mouth daily. 30 tablet 1   hydrOXYzine (ATARAX) 10 MG tablet Take 1 tablet (10 mg total) by mouth at bedtime. 30 tablet 0   levocetirizine (XYZAL) 5 MG tablet Take 1 tablet daily, can increase to 2 tablets twice daily as needed for hives.  This is maximum dose.  Do not take with other antihistamines such as hydroxyzine. 120 tablet 5   meclizine (ANTIVERT) 12.5 MG tablet Take 1 tablet (12.5 mg total) by mouth 3 (three) times daily as needed for dizziness. 30 tablet 0    methylPREDNISolone (MEDROL DOSEPAK) 4 MG TBPK tablet Day 1: 24 mg on day 1 administered as 8 mg before breakfast, 4 mg after lunch, 4 mg after supper, and 8 mg at bedtime or 24 mg as a single dose or divided into 2 or 3 doses upon initiation (regardless of time of day). Day 2: 20 mg on day 2 administered as 4 mg before breakfast, 4 mg after lunch, 4 mg after supper, and 8 mg at bedtime. Day 3: 16 mg on day 3 administered as 4 mg before breakfast, 4 mg after lunch, 4 mg after supper, and 4 mg at bedtime. Day 4: 12 mg on day 4 administered as 4 mg before breakfast, 4 mg after lunch, and 4 mg at bedtime. Day 5: 8 mg on day 5 administered as 4 mg before breakfast and 4 mg at bedtime. Day 6: 4 mg on day 6 administered as 4 mg before breakfast. 21 each 0   montelukast (SINGULAIR) 10 MG tablet TAKE 1 TABLET(10 MG) BY MOUTH AT BEDTIME 30 tablet 3   omalizumab (XOLAIR) 150 MG/ML prefilled syringe Inject 300 mg into the skin every 14 (fourteen) days. 12 mL 3   predniSONE (STERAPRED UNI-PAK 21 TAB) 10 MG (21) TBPK tablet Take by mouth daily. Take 6 tabs by mouth daily  for 2 days, then 5 tabs for 2 days, then 4 tabs for 2 days, then 3 tabs for 2 days, 2 tabs for 2 days, then 1 tab by mouth daily for 2 days 42 tablet 0   traZODone (DESYREL) 50 MG tablet Take 0.5-1 tablets (25-50 mg total) by mouth at bedtime. 30 tablet 2   Current Facility-Administered Medications  Medication Dose Route Frequency Provider Last Rate Last Admin   omalizumab Geoffry Paradise) prefilled syringe 300 mg  300 mg Subcutaneous Q14 Days Verlee Monte, MD   300 mg at 03/25/23 1914    Review of Systems  Constitutional: positive for fatigue Eyes: negative Ears, nose, mouth, throat, and face: negative Respiratory: negative Cardiovascular: negative Gastrointestinal: positive for nausea Genitourinary:negative Integument/breast: negative Hematologic/lymphatic: negative Musculoskeletal:negative Neurological: negative Behavioral/Psych:  negative Endocrine: negative Allergic/Immunologic: negative  Physical Exam  NWG:NFAOZ, healthy, no distress, well nourished, and well developed SKIN: skin color, texture, turgor are normal, no rashes or significant lesions HEAD: Normocephalic, No masses, lesions, tenderness or abnormalities EYES: normal, PERRLA, Conjunctiva are pink and non-injected EARS: External ears normal OROPHARYNX:no exudate, no erythema, and lips, buccal mucosa, and tongue normal  NECK: supple, no adenopathy, no JVD LYMPH:  no palpable lymphadenopathy, no hepatosplenomegaly BREAST:not examined LUNGS: clear to auscultation , and palpation HEART: regular rate & rhythm, no murmurs, and no gallops ABDOMEN:abdomen soft, non-tender, normal bowel sounds, and no masses or organomegaly BACK: Back symmetric, no curvature., No CVA tenderness EXTREMITIES:no joint deformities, effusion,  or inflammation, no edema  NEURO: alert & oriented x 3 with fluent speech, no focal motor/sensory deficits  PERFORMANCE STATUS: ECOG 0  LABORATORY DATA: Lab Results  Component Value Date   WBC 5.2 04/13/2023   HGB 12.7 04/13/2023   HCT 39.2 04/13/2023   MCV 76.0 (L) 04/13/2023   PLT 238 04/13/2023      Chemistry      Component Value Date/Time   NA 139 04/02/2023 1954   NA 138 03/19/2023 0953   K 3.8 04/02/2023 1954   CL 101 04/02/2023 1954   CO2 28 04/02/2023 1954   BUN 15 04/02/2023 1954   BUN 6 03/19/2023 0953   CREATININE 0.94 04/02/2023 1954      Component Value Date/Time   CALCIUM 8.8 (L) 04/02/2023 1954   ALKPHOS 47 04/02/2023 1954   AST 10 (L) 04/02/2023 1954   ALT 12 04/02/2023 1954   BILITOT 0.2 (L) 04/02/2023 1954   BILITOT 0.5 03/19/2023 0953       RADIOGRAPHIC STUDIES: CT Head Wo Contrast  Result Date: 04/03/2023 CLINICAL DATA:  Headache EXAM: CT HEAD WITHOUT CONTRAST TECHNIQUE: Contiguous axial images were obtained from the base of the skull through the vertex without intravenous contrast. RADIATION  DOSE REDUCTION: This exam was performed according to the departmental dose-optimization program which includes automated exposure control, adjustment of the mA and/or kV according to patient size and/or use of iterative reconstruction technique. COMPARISON:  None Available. FINDINGS: Brain: There is no mass, hemorrhage or extra-axial collection. The size and configuration of the ventricles and extra-axial CSF spaces are normal. The brain parenchyma is normal, without acute or chronic infarction. Vascular: No abnormal hyperdensity of the major intracranial arteries or dural venous sinuses. No intracranial atherosclerosis. Skull: The visualized skull base, calvarium and extracranial soft tissues are normal. Sinuses/Orbits: No fluid levels or advanced mucosal thickening of the visualized paranasal sinuses. No mastoid or middle ear effusion. The orbits are normal. IMPRESSION: Normal head CT. Electronically Signed   By: Deatra Robinson M.D.   On: 04/03/2023 01:06   DG Neck Soft Tissue  Result Date: 03/27/2023 CLINICAL DATA:  Neck swelling, hives, dysphagia EXAM: NECK SOFT TISSUES - 1+ VIEW COMPARISON:  None Available. FINDINGS: There is no evidence of retropharyngeal soft tissue swelling or epiglottic enlargement. The cervical airway is unremarkable and no radio-opaque foreign body identified. IMPRESSION: Negative. Electronically Signed   By: Helyn Numbers M.D.   On: 03/27/2023 22:10   CT SOFT TISSUE NECK W CONTRAST  Result Date: 03/26/2023 CLINICAL DATA:  Neck mass, nonpulsatile. Enlarged lymph nodes by ultrasound EXAM: CT NECK WITH CONTRAST TECHNIQUE: Multidetector CT imaging of the neck was performed using the standard protocol following the bolus administration of intravenous contrast. RADIATION DOSE REDUCTION: This exam was performed according to the departmental dose-optimization program which includes automated exposure control, adjustment of the mA and/or kV according to patient size and/or use of  iterative reconstruction technique. CONTRAST:  75mL ISOVUE-300 IOPAMIDOL (ISOVUE-300) INJECTION 61% COMPARISON:  Soft tissue ultrasound from 4 days ago FINDINGS: Pharynx and larynx: No evidence of mass or inflammation. Salivary glands: No inflammation, mass, or stone. Thyroid: Known findings followed by sonography in the left lobe. Lymph nodes: Generous size of cervical lymph nodes which are diffuse and symmetric. A upper left posterior triangle node measures 12 mm in length on coronal reformats (2 adjacent nodes at this level). No nodal heterogeneity or regional inflammation. Vascular: Unremarkable Limited intracranial: Unremarkable Visualized orbits: Unremarkable Mastoids and visualized paranasal sinuses: Clear Skeleton:  No acute or aggressive finding. Upper chest: Clear apical lungs IMPRESSION: Prominent cervical lymph node size but symmetric and similar size when compared with nodes seen on a 03/01/2022 brain MRI, likely reactive. These could be related to patient's chart history of connective tissue disease. Recommend clinical follow-up given palpability. Electronically Signed   By: Tiburcio Pea M.D.   On: 03/26/2023 17:11   US THYROID  Result Date: 03/22/2023 CLINICAL DATA:  Thyroid nodule follow-up EXAM: THYROID ULTRASOUND TECHNIQUE: Ultrasound examination of the thyroid gland and adjacent soft tissues was performed. COMPARISON:  05/16/2021 FINDINGS: Parenchymal Echotexture: Mildly heterogenous Isthmus: 0.2 cm Right lobe: 4.2 x 1.3 x 1.7 cm Left lobe: 4.5 x 1.2 x 1.6 cm _________________________________________________________ Estimated total number of nodules >/= 1 cm: 1 Number of spongiform nodules >/=  2 cm not described below (TR1): 0 Number of mixed cystic and solid nodules >/= 1.5 cm not described below (TR2): 0 _________________________________________________________ Nodule 1: 0.9 x 0.8 x 0.7 cm solid isoechoic left mid thyroid nodule (TI-RADS 3) is unchanged in size since prior examination and  still does not meet criteria for FNA or imaging follow-up. Nodule 2: 1.1 x 0.7 x 0.9 cm mixed solid cystic isoechoic (TI-RADS 2) left inferior thyroid nodule is not significantly changed in size compared to prior examination and does not meet criteria for FNA or imaging follow-up. 4 mm solid hypoechoic left mid thyroid nodule does not meet criteria for imaging surveillance or FNA. IMPRESSION: Left mid and inferior thyroid nodules are not significantly changed since prior examination and do not meet criteria for FNA or imaging follow-up. The above is in keeping with the ACR TI-RADS recommendations - J Am Coll Radiol 2017;14:587-595. Electronically Signed   By: Acquanetta Belling M.D.   On: 03/22/2023 11:01   US Soft Tissue Head/Neck (NON-THYROID)  Result Date: 03/22/2023 CLINICAL DATA:  Palpable abnormality of the neck. EXAM: ULTRASOUND OF HEAD/NECK SOFT TISSUES TECHNIQUE: Ultrasound examination of the head and neck soft tissues was performed in the area of clinical concern. COMPARISON:  None available FINDINGS: Targeted sonographic evaluation of the area of concern in the neck demonstrates prominent lymph nodes measuring 0.5 cm on short axis on the right and 0.6 cm on short axis on the left. Although these nodes are not enlarged by imaging criteria, the cortex of these lymph nodes appears somewhat asymmetrically thickened. IMPRESSION: Palpable abnormalities in the neck correspond to prominent lymph nodes. The cortex of these lymph nodes appear somewhat asymmetrically thickened. Further evaluation with contrast enhanced soft tissue neck CT should be performed to better evaluate these lymph nodes. Electronically Signed   By: Acquanetta Belling M.D.   On: 03/22/2023 10:56    ASSESSMENT: This is a very pleasant 32 years old African-American female with beta thalassemia minor in addition to iron deficiency presented with microcytosis with no evidence of anemia.   PLAN: I had a lengthy discussion with the patient today about  her current condition and treatment options. I repeated several studies today including CBC that showed normal hemoglobin of 12.7 and hematocrit 39.2 with MCV of 76.0 consistent with her beta thalassemia minor Iron study showed improvement of her serum iron to 68 with iron saturation of 19%.  Ferritin level, vitamin B12 and serum folate as well as TSH and LDH are still pending. I recommended for the patient to continue on the oral iron supplement and I will give her prescription for Integra +1 capsule p.o. daily.  Her ferritin level could be elevated but this is  secondary to underlying inflammatory process with connective tissue disease. I do not see a need to give the patient IV iron infusion since she is responding very well to the oral iron supplements. I will see her back for follow-up visit in 3 months for evaluation with repeat blood work. She was advised to call immediately if she has any other concerning symptoms in the interval. The patient voices understanding of current disease status and treatment options and is in agreement with the current care plan.  All questions were answered. The patient knows to call the clinic with any problems, questions or concerns. We can certainly see the patient much sooner if necessary.  Thank you so much for allowing me to participate in the care of Shannon Huerta. I will continue to follow up the patient with you and assist in her care.  The total time spent in the appointment was 60 minutes.  Disclaimer: This note was dictated with voice recognition software. Similar sounding words can inadvertently be transcribed and may not be corrected upon review.   Lajuana Matte April 13, 2023, 11:29 AM

## 2023-05-02 ENCOUNTER — Other Ambulatory Visit (HOSPITAL_BASED_OUTPATIENT_CLINIC_OR_DEPARTMENT_OTHER): Payer: Self-pay | Admitting: Family Medicine

## 2023-05-02 DIAGNOSIS — F410 Panic disorder [episodic paroxysmal anxiety] without agoraphobia: Secondary | ICD-10-CM

## 2023-05-02 DIAGNOSIS — L508 Other urticaria: Secondary | ICD-10-CM

## 2023-05-02 DIAGNOSIS — L500 Allergic urticaria: Secondary | ICD-10-CM

## 2023-05-03 ENCOUNTER — Encounter (HOSPITAL_BASED_OUTPATIENT_CLINIC_OR_DEPARTMENT_OTHER): Payer: Self-pay | Admitting: Family Medicine

## 2023-05-03 ENCOUNTER — Ambulatory Visit (INDEPENDENT_AMBULATORY_CARE_PROVIDER_SITE_OTHER)

## 2023-05-03 DIAGNOSIS — L501 Idiopathic urticaria: Secondary | ICD-10-CM | POA: Diagnosis not present

## 2023-05-03 MED ORDER — OMALIZUMAB 150 MG ~~LOC~~ SOLR
300.0000 mg | SUBCUTANEOUS | Status: AC
Start: 2023-05-03 — End: ?
  Administered 2023-05-03: 300 mg via SUBCUTANEOUS

## 2023-05-04 MED ORDER — ALPRAZOLAM 0.25 MG PO TABS
0.2500 mg | ORAL_TABLET | Freq: Two times a day (BID) | ORAL | 0 refills | Status: DC | PRN
Start: 2023-05-04 — End: 2023-05-20

## 2023-05-04 MED ORDER — METHYLPREDNISOLONE 4 MG PO TBPK
ORAL_TABLET | ORAL | 0 refills | Status: DC
Start: 2023-05-04 — End: 2023-05-08

## 2023-05-07 ENCOUNTER — Encounter (HOSPITAL_BASED_OUTPATIENT_CLINIC_OR_DEPARTMENT_OTHER): Payer: Self-pay | Admitting: Family Medicine

## 2023-05-08 ENCOUNTER — Other Ambulatory Visit (HOSPITAL_BASED_OUTPATIENT_CLINIC_OR_DEPARTMENT_OTHER): Payer: Self-pay | Admitting: Family Medicine

## 2023-05-08 DIAGNOSIS — L508 Other urticaria: Secondary | ICD-10-CM

## 2023-05-08 DIAGNOSIS — L500 Allergic urticaria: Secondary | ICD-10-CM

## 2023-05-08 MED ORDER — PREDNISONE 10 MG (21) PO TBPK: ORAL_TABLET | Freq: Every day | ORAL | 0 refills | Status: DC

## 2023-05-09 MED ORDER — HYDROXYZINE HCL 10 MG PO TABS: 10.0000 mg | ORAL_TABLET | Freq: Every evening | ORAL | 0 refills | Status: DC

## 2023-05-16 ENCOUNTER — Ambulatory Visit

## 2023-05-16 DIAGNOSIS — L501 Idiopathic urticaria: Secondary | ICD-10-CM | POA: Diagnosis not present

## 2023-05-20 ENCOUNTER — Other Ambulatory Visit (HOSPITAL_BASED_OUTPATIENT_CLINIC_OR_DEPARTMENT_OTHER): Payer: Self-pay | Admitting: Family Medicine

## 2023-05-20 DIAGNOSIS — F430 Acute stress reaction: Secondary | ICD-10-CM

## 2023-05-22 MED ORDER — ALPRAZOLAM 0.25 MG PO TABS
0.2500 mg | ORAL_TABLET | Freq: Two times a day (BID) | ORAL | 0 refills | Status: DC | PRN
Start: 2023-05-22 — End: 2023-06-07

## 2023-05-22 NOTE — Telephone Encounter (Signed)
PDMP reviewed. No red flags. Refill sent.

## 2023-05-31 ENCOUNTER — Other Ambulatory Visit (HOSPITAL_BASED_OUTPATIENT_CLINIC_OR_DEPARTMENT_OTHER): Payer: Self-pay | Admitting: Family Medicine

## 2023-05-31 DIAGNOSIS — F430 Acute stress reaction: Secondary | ICD-10-CM

## 2023-06-01 ENCOUNTER — Other Ambulatory Visit (HOSPITAL_BASED_OUTPATIENT_CLINIC_OR_DEPARTMENT_OTHER): Payer: Self-pay | Admitting: Family Medicine

## 2023-06-01 MED ORDER — PREDNISONE 5 MG PO TABS: 5.0000 mg | ORAL_TABLET | Freq: Every day | ORAL | 2 refills | Status: DC

## 2023-06-01 NOTE — Progress Notes (Signed)
PDMP reviewed. Prescription sent for alprazolam 0.25mg  with 20 tablets on 05/22/2023. Refill request sent by pharmacy was duplicate. Refill sent for prednisone 5mg  tablets due to patient with chronic urticaria.

## 2023-06-03 ENCOUNTER — Ambulatory Visit: Admitting: Internal Medicine

## 2023-06-03 ENCOUNTER — Ambulatory Visit

## 2023-06-03 ENCOUNTER — Other Ambulatory Visit: Payer: Self-pay

## 2023-06-03 ENCOUNTER — Encounter: Payer: Self-pay | Admitting: Internal Medicine

## 2023-06-03 VITALS — BP 114/80 | HR 68 | Temp 98.4°F | Resp 16 | Ht 65.0 in | Wt 184.8 lb

## 2023-06-03 DIAGNOSIS — J3089 Other allergic rhinitis: Secondary | ICD-10-CM

## 2023-06-03 DIAGNOSIS — L501 Idiopathic urticaria: Secondary | ICD-10-CM | POA: Diagnosis not present

## 2023-06-03 DIAGNOSIS — K219 Gastro-esophageal reflux disease without esophagitis: Secondary | ICD-10-CM | POA: Diagnosis not present

## 2023-06-03 DIAGNOSIS — J4520 Mild intermittent asthma, uncomplicated: Secondary | ICD-10-CM | POA: Diagnosis not present

## 2023-06-03 MED ORDER — LEVOCETIRIZINE DIHYDROCHLORIDE 5 MG PO TABS: ORAL_TABLET | ORAL | 5 refills | Status: DC

## 2023-06-03 MED ORDER — TACROLIMUS 1 MG PO CAPS: 1.0000 mg | ORAL_CAPSULE | Freq: Two times a day (BID) | ORAL | 1 refills | Status: DC

## 2023-06-03 MED ORDER — MONTELUKAST SODIUM 10 MG PO TABS: 10.0000 mg | ORAL_TABLET | Freq: Every day | ORAL | 3 refills | Status: DC

## 2023-06-03 MED ORDER — FAMOTIDINE 20 MG PO TABS: 20.0000 mg | ORAL_TABLET | Freq: Two times a day (BID) | ORAL | 5 refills | Status: DC

## 2023-06-03 NOTE — Patient Instructions (Addendum)
Hives Use the least amount of medications possible while remaining hive free Levocetirizine (Xyzal) 5 mg twice a day and famotidine (Pepcid) 20 mg twice a day. If no symptoms for 7-14 days then decrease to. Levocetirizine (Xyzal) 5 mg twice a day and famotidine (Pepcid) 20 mg once a day.  If no symptoms for 7-14 days then decrease to. Levocetirizine (Xyzal) 5 mg twice a day.  If no symptoms for 7-14 days then decrease to. Levocetirizine (Xyzal) 5 mg once a day.  Continue montelukast 10 mg once a day Continue hydroxyzine 25 mg once a day at bedtime as needed for itch Continue Xolair 300 mg once every 2 weeks and have access to an epinephrine autoinjector set Add tacrolimus 1 mg twice daily (most common side effects: headaches and stomach upset)  Angioedema Associated angioedema occurs in up to 50% of patients with chronic urticaria.  Continue with the treatment plan as listed above  Asthma During respiratory illness: start Advair 100-1 puff twice a day to control asthma for 1-2 weeks. Continue albuterol 2 puffs once every 4 hours as needed for cough or wheeze You may use albuterol 2 puffs 5 to 15 minutes before activity to decrease cough or wheeze  Allergic rhinitis Continue allergen avoidance measures directed toward grass pollen, mold, and dust mite as listed below  Continue Xyzal 5 mg once a day as needed for runny nose or itch Continue azelastine nasal spray 1 to 2 sprays per nostril 2 times a day if needed Consider saline nasal rinses as needed for nasal symptoms. Use this before any medicated nasal sprays for best result  Reflux Continue dietary lifestyle modifications as listed below Omeprazole 20 mg once a day to control reflux as needed. Take this medication 30 minutes before your first meal for best results Continue pepcid 20 - 40 mg nightly.  Call the clinic if this treatment plan is not working well for you.  Follow up in the clinic with Dr. Maurine Minister in 4-8 weeks or sooner if  needed.  Reducing Pollen Exposure The American Academy of Allergy, Asthma and Immunology suggests the following steps to reduce your exposure to pollen during allergy seasons. Do not hang sheets or clothing out to dry; pollen may collect on these items. Do not mow lawns or spend time around freshly cut grass; mowing stirs up pollen. Keep windows closed at night.  Keep car windows closed while driving. Minimize morning activities outdoors, a time when pollen counts are usually at their highest. Stay indoors as much as possible when pollen counts or humidity is high and on windy days when pollen tends to remain in the air longer. Use air conditioning when possible.  Many air conditioners have filters that trap the pollen spores. Use a HEPA room air filter to remove pollen form the indoor air you breathe.  Control of Mold Allergen Mold and fungi can grow on a variety of surfaces provided certain temperature and moisture conditions exist.  Outdoor molds grow on plants, decaying vegetation and soil.  The major outdoor mold, Alternaria and Cladosporium, are found in very high numbers during hot and dry conditions.  Generally, a late Summer - Fall peak is seen for common outdoor fungal spores.  Rain will temporarily lower outdoor mold spore count, but counts rise rapidly when the rainy period ends.  The most important indoor molds are Aspergillus and Penicillium.  Dark, humid and poorly ventilated basements are ideal sites for mold growth.  The next most common sites of mold growth  are the bathroom and the kitchen.  Outdoor [employer] Use air conditioning and keep windows closed Avoid exposure to decaying vegetation. Avoid leaf raking. Avoid grain handling. Consider wearing a face mask if working in moldy areas.  Indoor Mold Control Maintain humidity below 50%. Clean washable surfaces with 5% bleach solution. Remove sources e.g. Contaminated carpets.   Control of Dust Mite Allergen Dust mites  play a major role in allergic asthma and rhinitis. They occur in environments with high humidity wherever human skin is found. Dust mites absorb humidity from the atmosphere (ie, they do not drink) and feed on organic matter (including shed human and animal skin). Dust mites are a microscopic type of insect that you cannot see with the naked eye. High levels of dust mites have been detected from mattresses, pillows, carpets, upholstered furniture, bed covers, clothes, soft toys and any woven material. The principal allergen of the dust mite is found in its feces. A gram of dust may contain 1,000 mites and 250,000 fecal particles. Mite antigen is easily measured in the air during house cleaning activities. Dust mites do not bite and do not cause harm to humans, other than by triggering allergies/asthma.  Ways to decrease your exposure to dust mites in your home:  1. Encase mattresses, box springs and pillows with a mite-impermeable barrier or cover  2. Wash sheets, blankets and drapes weekly in hot water (130 F) with detergent and dry them in a dryer on the hot setting.  3. Have the room cleaned frequently with a vacuum cleaner and a damp dust-mop. For carpeting or rugs, vacuuming with a vacuum cleaner equipped with a high-efficiency particulate air (HEPA) filter. The dust mite allergic individual should not be in a room which is being cleaned and should wait 1 hour after cleaning before going into the room.  4. Do not sleep on upholstered furniture (eg, couches).  5. If possible removing carpeting, upholstered furniture and drapery from the home is ideal. Horizontal blinds should be eliminated in the rooms where the person spends the most time (bedroom, study, television room). Washable vinyl, roller-type shades are optimal.  6. Remove all non-washable stuffed toys from the bedroom. Wash stuffed toys weekly like sheets and blankets above.  7. Reduce indoor humidity to less than 50%. Inexpensive  humidity monitors can be purchased at most hardware stores. Do not use a humidifier as can make the problem worse and are not recommended.

## 2023-06-03 NOTE — Progress Notes (Signed)
FOLLOW UP Date of Service/Encounter:  06/03/23  Subjective:  Shannon Huerta (DOB: February 04, 1991) is a 32 y.o. female who [facility] in re-evaluation of the following: urticaria, angioedema, asthma, allergic rhinitis, and reflux  History obtained from: chart review and patient.   For Review, LV was on 01/01/23  with Thermon Leyland, FNP seen for routine follow-up. See below for summary of history and diagnostics.    Therapeutic plans/changes recommended: She was having another hives flare after taking emergency contraceptive.  She was started on prednisone 20 mg for 5 days and then tapered off slowly. ----------------------------------------------------- Pertinent History/Diagnostics:  Urticaria:  She has a history of very hard to control chronic urticaria.  She has been on high-dose Xolair 300 mg every 2 weeks since November 2023.  She has been on and off prednisone for years.  She has 2 children at home both with autism. Cyclosporine has been added in the past, but she did not remain on this for long.  The plan was for her to remain on Xolair with cyclosporine as this has shown effectiveness in several clinical studies.  She has tried and failed Plaquenil in the past but had to stop due to an adverse reaction affecting her teeth. 2021 lab work for hives showed an elevated CU index of 35, negative thyroid antibodies, normal baseline tryptase of 10.1.  Negative alpha gal panel. 2023 ANA +1:320 speckled.  2024 double-stranded DNA negative, lupus inhibitor panel negative, beta-2 glycoprotein negative. Asthma Controlled on Advair 100, 1 puff daily. Reflux Current meds: Omeprazole 20 mg daily, famotidine 20 mg twice daily --------------------------------------------------- Today presents for follow-up. Consent obtained for electronic dictation and use of recording device. History of Present Illness   The patient, with a history of chronic hives, presents with  ongoing distress related to her condition. She describes the hives as a significant source of stress and anxiety, which she believes exacerbates the condition. The patient reports feeling "a little crazy" trying to identify potential triggers for the hives, and struggling with the understanding that the hives are not allergy realted. She expresses frustration with the unpredictable nature of the hives, noting that they can disappear completely for periods of time only to return suddenly and severely.  The patient has been on a regimen of Xolair, administered every two weeks at a high dose of 300mg , for an unspecified period. However, she reports that the Xolair has not been effective in managing her hives. She also reports a previous trial of cyclosporine, which she discontinued due to concerns about dental sensitivity and potential tooth loss. The patient acknowledges poor communication with the healthcare team when she is feeling well, leading to a lack of continuity in her treatment plan.  In addition to the hives, the patient reports anxiety, which she manages with Xanax and hydroxyzine as needed. She also takes levocetirizine, Pepcid, and montelukast for her hives, noting that while these medications do not completely eliminate the hives, they do seem to reduce the severity. The patient also has a history of asthma, which is currently well-controlled with albuterol as needed. She reports no current use of daily inhaled corticosteroids. The patient also has a history of gastroesophageal reflux disease, which is managed with omeprazole and Pepcid. She notes that her reflux symptoms worsen when she takes higher doses of prednisone for her hives.     Chart Review:  She has had to go to to multiple urgent care and ED visits for hives  getting prednisone without relief.  On chart review it looks like there was a few missed Xolair injections in the period from May to July.  She has received them every 2 weeks  for the past 3 injections.  Her most recent injection was 03/25/2023. She is not coming consistently for her follow-up appointments. She does frequently call requesting prednisone which is usually prescribed due to flares of hives.  However we have been consistently talking with her about finding an alternative treatment plan given the known adverse side effects of recurrent systemic glucocorticoids. She was also started on cyclosporine 50 mg twice daily in addition to her Xolair on August 09, 2022.  However had a repeat visit on August 14, 2022, she reported only taking 25 mg twice daily.  At her next follow-up visit in May, she was no longer taking cyclosporine stating that it was not helpful, but unclear how long she tried this medication  All medications reviewed by clinical staff and updated in chart. No new pertinent medical or surgical history except as noted in HPI.  ROS: All others negative except as noted per HPI.   Objective:  BP 114/80 (BP Location: Right Arm, Patient Position: Sitting, Cuff Size: Normal)   Pulse 68   Temp 98.4 F (36.9 C) (Temporal)   Resp 16   Ht 5\' 5"  (1.651 m)   Wt 184 lb 12.8 oz (83.8 kg)   SpO2 100%   BMI 30.75 kg/m  Body mass index is 30.75 kg/m. Physical Exam: General Appearance:  Alert, cooperative, no distress, appears stated age  Head:  Normocephalic, without obvious abnormality, atraumatic  Eyes:  Conjunctiva clear, EOM's intact  Ears EACs normal bilaterally and normal TMs bilaterally  Nose: Nares normal, normal mucosa  Throat: Lips, tongue normal; teeth and gums normal, normal posterior oropharynx  Neck: Supple, symmetrical  Lungs:   clear to auscultation bilaterally, Respirations unlabored, no coughing  Heart:  regular rate and rhythm and no murmur, Appears well perfused  Extremities: No edema  Skin: Skin color, texture, turgor normal and no rashes or lesions on visualized portions of skin  Neurologic: No gross deficits   Labs:  Lab  Orders  No laboratory test(s) ordered today    Spirometry:  Tracings reviewed. Her effort: Good reproducible efforts. FVC: 3.55L FEV1: 3.03L, 106% predicted FEV1/FVC ratio: 0.85 Interpretation: Spirometry consistent with normal pattern.  Please see scanned spirometry results for details.  Assessment/Plan   Hives Use the least amount of medications possible while remaining hive free Levocetirizine (Xyzal) 5 mg twice a day and famotidine (Pepcid) 20 mg twice a day. If no symptoms for 7-14 days then decrease to. Levocetirizine (Xyzal) 5 mg twice a day and famotidine (Pepcid) 20 mg once a day.  If no symptoms for 7-14 days then decrease to. Levocetirizine (Xyzal) 5 mg twice a day.  If no symptoms for 7-14 days then decrease to. Levocetirizine (Xyzal) 5 mg once a day.  Continue montelukast 10 mg once a day Continue hydroxyzine 25 mg once a day at bedtime as needed for itch Continue Xolair 300 mg once every 2 weeks and have access to an epinephrine autoinjector set Add tacrolimus 1 mg twice daily (most common side effects: headaches and stomach upset)-may need to titrate to 2 mg BID. once controlled consider stepping down xolair.  Angioedema Associated angioedema occurs in up to 50% of patients with chronic urticaria.  Continue with the treatment plan as listed above  Asthma During respiratory illness: start Advair 100-1 puff twice  a day to control asthma for 1-2 weeks. Continue albuterol 2 puffs once every 4 hours as needed for cough or wheeze You may use albuterol 2 puffs 5 to 15 minutes before activity to decrease cough or wheeze  Allergic rhinitis Continue allergen avoidance measures directed toward grass pollen, mold, and dust mite as listed below  Continue Xyzal 5 mg once a day as needed for runny nose or itch Continue azelastine nasal spray 1 to 2 sprays per nostril 2 times a day if needed Consider saline nasal rinses as needed for nasal symptoms. Use this before any medicated  nasal sprays for best result  Reflux Continue dietary lifestyle modifications as listed below Omeprazole 20 mg once a day to control reflux as needed. Take this medication 30 minutes before your first meal for best results Continue pepcid 20 - 40 mg nightly.  Call the clinic if this treatment plan is not working well for you.  Follow up in the clinic with Dr. Maurine Minister in 4-8 weeks or sooner if needed.   Other: Xolair given in clinic today  Tonny Bollman, MD  [facility]

## 2023-06-04 ENCOUNTER — Other Ambulatory Visit (HOSPITAL_BASED_OUTPATIENT_CLINIC_OR_DEPARTMENT_OTHER): Payer: Self-pay | Admitting: Family Medicine

## 2023-06-07 ENCOUNTER — Other Ambulatory Visit (HOSPITAL_BASED_OUTPATIENT_CLINIC_OR_DEPARTMENT_OTHER): Payer: Self-pay | Admitting: Family Medicine

## 2023-06-07 DIAGNOSIS — F410 Panic disorder [episodic paroxysmal anxiety] without agoraphobia: Secondary | ICD-10-CM

## 2023-06-11 MED ORDER — ALPRAZOLAM 0.25 MG PO TABS: 0.2500 mg | ORAL_TABLET | Freq: Two times a day (BID) | ORAL | 0 refills | Status: DC | PRN

## 2023-06-11 NOTE — Telephone Encounter (Signed)
PDMP reviewed. No red flags present. Refill sent.

## 2023-06-13 ENCOUNTER — Other Ambulatory Visit (HOSPITAL_BASED_OUTPATIENT_CLINIC_OR_DEPARTMENT_OTHER): Payer: Self-pay

## 2023-06-13 ENCOUNTER — Encounter (HOSPITAL_BASED_OUTPATIENT_CLINIC_OR_DEPARTMENT_OTHER): Payer: Self-pay | Admitting: Family Medicine

## 2023-06-13 ENCOUNTER — Encounter: Payer: Self-pay | Admitting: Internal Medicine

## 2023-06-13 DIAGNOSIS — R4589 Other symptoms and signs involving emotional state: Secondary | ICD-10-CM

## 2023-06-13 NOTE — Telephone Encounter (Signed)
Please let her know that she can stop the tacrolimus.   I'm going to see if we can increase her Xolair to 600 mg every 2 weeks-Shelsie is this possible?  If this is not approved, would consider starting a different class of medication. We could consider another class of medication-dapsone, sulfasalazine, etc....all of these have their own potential side effects, as does any medication that we put in our body.  Our goal is to get your hives controlled.  I agree with seeking counseling.

## 2023-06-15 ENCOUNTER — Other Ambulatory Visit: Payer: Self-pay | Admitting: Diagnostic Neuroimaging

## 2023-06-17 ENCOUNTER — Ambulatory Visit

## 2023-06-17 ENCOUNTER — Other Ambulatory Visit (HOSPITAL_BASED_OUTPATIENT_CLINIC_OR_DEPARTMENT_OTHER): Payer: Self-pay | Admitting: Family Medicine

## 2023-06-17 ENCOUNTER — Ambulatory Visit: Admitting: Internal Medicine

## 2023-06-17 DIAGNOSIS — R42 Dizziness and giddiness: Secondary | ICD-10-CM

## 2023-06-17 NOTE — Progress Notes (Signed)
Patient did not show for appointment today. Historically difficult to treat hives, has not tolerated cyclosporine or tacrolimus. Requiring/requesting frequent prednisone.  Trial of high dose xolair 300 mg q2 weeks failed. Plan to discuss trial of hydroxychloroquine.  If hesitant, consider xoliar 600 mg q2 weeks since she does tolerate Xolair though has been ineffective thus far.

## 2023-06-18 ENCOUNTER — Ambulatory Visit (HOSPITAL_BASED_OUTPATIENT_CLINIC_OR_DEPARTMENT_OTHER): Admitting: Family Medicine

## 2023-06-18 ENCOUNTER — Other Ambulatory Visit: Payer: Self-pay | Admitting: Internal Medicine

## 2023-06-18 ENCOUNTER — Other Ambulatory Visit (HOSPITAL_BASED_OUTPATIENT_CLINIC_OR_DEPARTMENT_OTHER): Payer: Self-pay

## 2023-06-18 ENCOUNTER — Encounter (HOSPITAL_BASED_OUTPATIENT_CLINIC_OR_DEPARTMENT_OTHER): Payer: Self-pay

## 2023-06-24 ENCOUNTER — Encounter (HOSPITAL_BASED_OUTPATIENT_CLINIC_OR_DEPARTMENT_OTHER): Payer: Self-pay | Admitting: Family Medicine

## 2023-06-24 ENCOUNTER — Ambulatory Visit (INDEPENDENT_AMBULATORY_CARE_PROVIDER_SITE_OTHER): Admitting: Family Medicine

## 2023-06-24 VITALS — BP 140/92 | HR 86 | Ht 65.0 in | Wt 186.8 lb

## 2023-06-24 DIAGNOSIS — R42 Dizziness and giddiness: Secondary | ICD-10-CM | POA: Diagnosis not present

## 2023-06-24 DIAGNOSIS — R5382 Chronic fatigue, unspecified: Secondary | ICD-10-CM

## 2023-06-24 DIAGNOSIS — G935 Compression of brain: Secondary | ICD-10-CM | POA: Insufficient documentation

## 2023-06-24 MED ORDER — ALPRAZOLAM 0.25 MG PO TABS: 0.2500 mg | ORAL_TABLET | Freq: Two times a day (BID) | ORAL | 0 refills | Status: DC

## 2023-06-24 MED ORDER — ONDANSETRON HCL 4 MG PO TABS
4.0000 mg | ORAL_TABLET | Freq: Three times a day (TID) | ORAL | 0 refills | Status: DC | PRN
Start: 2023-06-24 — End: 2023-08-01

## 2023-06-24 MED ORDER — RIZATRIPTAN BENZOATE 10 MG PO TBDP: 10.0000 mg | ORAL_TABLET | ORAL | 1 refills | Status: DC | PRN

## 2023-06-24 NOTE — Patient Instructions (Signed)

## 2023-06-24 NOTE — Progress Notes (Signed)
Established Patient Office Visit  Subjective   Patient ID: Shannon Huerta, female    DOB: 04-09-1991  Age: 32 y.o. MRN: [id number]  Chief Complaint  Patient presents with   Dizziness    Ongoing for awhile, seems it has worsened within the last week or so   Fatigue   Shannon Huerta is a 33 year old female patient who presents today for concerns about ongoing dizziness and fatigue.  She reports hives are better at this current time.   She reports she has been experiencing an increase in dizziness that does not improve with meclizine or OTC dramamine. She reports her dizziness is consistent, never stops. She reports that movement of her head or eyes make it worse- also movement of her head or body causes dizziness to worsen. Turns her head too fast and gets dizzy and throbbing. She reports the throbbing does not happen every time- most often in her temple region. Throbbing doesn't last, is intermittent, but is noticeable with an increase in intra-abd pressure, such as bending over, straining with bowel movement, blowing balloon, attempting to complete her PFTs, and laughing too hard.   She first noticed symptoms of dizziness about 2 years prior to Aug 2023; however, at that time, symptoms were not as severe. She now reports frequent nausea and constant dizziness. She reports her eyes move side-to-side (nystagmus) if she stares at her phone or the tv for an extended period of time. She has noticed some intermittent tingling on the left side of her upper extremity. Denies weakness, change in speech.   In childhood, she reports that she has 4 different syncopal episodes- thinks it was r/t overheating. Reports she also experienced syncopal episodes when she would get really hot in the shower. Denies recently having any syncopal episodes.   Patient was referral to neurology in the past due to abnormal MRI with possible borderline Chiari malformation. Dr. Marjory Lies felt this was an unremarkable MRI and did  not agree with the measurements to classify this as a Chiari malformation. She did complete physical therapy and reports her symptoms did improve- thought it was possible BPPV at that time. However, she is not sure if her vertigo has returned or if something else is going on, but these symptoms are impacting her daily life.    Review of Systems  Constitutional:  Positive for malaise/fatigue. Negative for chills and fever.  HENT:  Negative for hearing loss and tinnitus.   Eyes:  Negative for blurred vision and double vision.  Respiratory:  Negative for cough and shortness of breath.   Cardiovascular:  Negative for chest pain and palpitations.  Gastrointestinal:  Positive for nausea. Negative for vomiting.  Musculoskeletal:  Negative for back pain and neck pain.  Neurological:  Positive for dizziness and headaches. Negative for tremors, sensory change, speech change, focal weakness, seizures, loss of consciousness and weakness.  Psychiatric/Behavioral:  Negative for depression, memory loss, substance abuse and suicidal ideas. The patient is not nervous/anxious and does not have insomnia.       Objective:     BP (!) 140/92   Pulse 86   Ht 5\' 5"  (1.651 m)   Wt 186 lb 12.8 oz (84.7 kg)   LMP 06/09/2023 (Exact Date)   SpO2 100%   BMI 31.09 kg/m  BP Readings from Last 3 Encounters:  06/24/23 (!) 140/92  06/03/23 114/80  04/13/23 123/86     Physical Exam Constitutional:      Appearance: Normal appearance.  Eyes:  General: No visual field deficit. Cardiovascular:     Rate and Rhythm: Normal rate and regular rhythm.     Pulses: Normal pulses.     Heart sounds: Normal heart sounds.  Pulmonary:     Effort: Pulmonary effort is normal.     Breath sounds: Normal breath sounds.  Neurological:     Mental Status: She is alert.     Cranial Nerves: Cranial nerves 2-12 are intact. No cranial nerve deficit, dysarthria or facial asymmetry.     Sensory: Sensation is intact. No sensory  deficit.     Motor: Motor function is intact. No weakness.     Coordination: Coordination is intact. Coordination normal. Finger-Nose-Finger Test and Heel to [facility] Test normal.     Gait: Gait is intact. Gait normal.     Deep Tendon Reflexes: Reflexes normal.  Psychiatric:        Mood and Affect: Mood normal.        Behavior: Behavior normal.      Assessment & Plan:  Dizziness Assessment & Plan: Patient presents today with constant dizziness, frequent nausea, occasional throbbing pressure in her head with change in position, and fatigue. No nystagmus with Dix-Hallpike maneuver. Motor strength 5/5 in BUEs & BLEs. Sensory, normal and symmetric to light touch. Coordination intact. DTRs present and symmetric. Normal gait. No red flags present on exam. Reasonable to repeat MRI due to history of borderline type I Chiari malformation and increasing severity of symptoms. Provided patient with refill for rizatriptan for migraine relief. Discussed taking alprazolam twice daily as scheduled due to dizziness and nausea. Also provided patient with ondansetron PRN.   Orders: -     MR BRAIN W WO CONTRAST; Future -     Ondansetron HCl; Take 1 tablet (4 mg total) by mouth every 8 (eight) hours as needed for nausea or vomiting.  Dispense: 20 tablet; Refill: 0 -     Rizatriptan Benzoate; Take 1 tablet (10 mg total) by mouth as needed for migraine. May repeat in 2 hours if needed  Dispense: 8 tablet; Refill: 1 -     ALPRAZolam; Take 1 tablet (0.25 mg total) by mouth 2 (two) times daily.  Dispense: 60 tablet; Refill: 0 -     Vitamin B12 -     Hemoglobin A1c -     CBC with Differential/Platelet -     Iron, TIBC and Ferritin Panel -     Basic metabolic panel  Chiari malformation type I [facility]) Assessment & Plan: Reviewed Dr. Richrd Humbles note from 03/23/2022. He felt that her MRI was not measured appropriately and the results were unremarkable. Due to patient experiencing symptoms daily and questionable brain MRI,  would be reasonable to repeat and follow-up with neurology.    Chronic fatigue Assessment & Plan: Patient reports she has been feeling fatigued. Will repeat basic labs to rule out anemia, hypothyroidism, liver and kidney abnormalities.   Orders: -     Vitamin B12 -     Hemoglobin A1c -     CBC with Differential/Platelet -     Iron, TIBC and Ferritin Panel -     Basic metabolic panel     Return in about 2 weeks (around 07/08/2023) for ongoing dizziness (virtual).    Alyson Reedy, FNP

## 2023-06-24 NOTE — Assessment & Plan Note (Signed)
Patient reports she has been feeling fatigued. Will repeat basic labs to rule out anemia, hypothyroidism, liver and kidney abnormalities.

## 2023-06-24 NOTE — Assessment & Plan Note (Signed)
Reviewed Dr. Richrd Humbles note from 03/23/2022. He felt that her MRI was not measured appropriately and the results were unremarkable. Due to patient experiencing symptoms daily and questionable brain MRI, would be reasonable to repeat and follow-up with neurology.

## 2023-06-24 NOTE — Assessment & Plan Note (Signed)
Patient presents today with constant dizziness, frequent nausea, occasional throbbing pressure in her head with change in position, and fatigue. No nystagmus with Dix-Hallpike maneuver. Motor strength 5/5 in BUEs & BLEs. Sensory, normal and symmetric to light touch. Coordination intact. DTRs present and symmetric. Normal gait. No red flags present on exam. Reasonable to repeat MRI due to history of borderline type I Chiari malformation and increasing severity of symptoms. Provided patient with refill for rizatriptan for migraine relief. Discussed taking alprazolam twice daily as scheduled due to dizziness and nausea. Also provided patient with ondansetron PRN.

## 2023-06-25 ENCOUNTER — Encounter (HOSPITAL_BASED_OUTPATIENT_CLINIC_OR_DEPARTMENT_OTHER): Payer: Self-pay | Admitting: Family Medicine

## 2023-06-25 DIAGNOSIS — R42 Dizziness and giddiness: Secondary | ICD-10-CM

## 2023-06-25 DIAGNOSIS — G935 Compression of brain: Secondary | ICD-10-CM

## 2023-06-25 DIAGNOSIS — G43819 Other migraine, intractable, without status migrainosus: Secondary | ICD-10-CM

## 2023-06-25 LAB — CBC WITH DIFFERENTIAL/PLATELET
Basophils Absolute: 0 10*3/uL (ref 0.0–0.2)
Basos: 0 %
EOS (ABSOLUTE): 0.2 10*3/uL (ref 0.0–0.4)
Eos: 2 %
Hematocrit: 41.8 % (ref 34.0–46.6)
Hemoglobin: 13.3 g/dL (ref 11.1–15.9)
Immature Grans (Abs): 0 10*3/uL (ref 0.0–0.1)
Immature Granulocytes: 0 %
Lymphocytes Absolute: 2.2 10*3/uL (ref 0.7–3.1)
Lymphs: 26 %
MCH: 24.6 pg — ABNORMAL LOW (ref 26.6–33.0)
MCHC: 31.8 g/dL (ref 31.5–35.7)
MCV: 77 fL — ABNORMAL LOW (ref 79–97)
Monocytes Absolute: 0.6 10*3/uL (ref 0.1–0.9)
Monocytes: 7 %
Neutrophils Absolute: 5.4 10*3/uL (ref 1.4–7.0)
Neutrophils: 65 %
Platelets: 314 10*3/uL (ref 150–450)
RBC: 5.41 x10E6/uL — ABNORMAL HIGH (ref 3.77–5.28)
RDW: 15.2 % (ref 11.7–15.4)
WBC: 8.4 10*3/uL (ref 3.4–10.8)

## 2023-06-25 LAB — BASIC METABOLIC PANEL
BUN/Creatinine Ratio: 14 (ref 9–23)
BUN: 10 mg/dL (ref 6–20)
CO2: 19 mmol/L — ABNORMAL LOW (ref 20–29)
Calcium: 9 mg/dL (ref 8.7–10.2)
Chloride: 103 mmol/L (ref 96–106)
Creatinine, Ser: 0.71 mg/dL (ref 0.57–1.00)
Glucose: 90 mg/dL (ref 70–99)
Potassium: 4 mmol/L (ref 3.5–5.2)
Sodium: 140 mmol/L (ref 134–144)
eGFR: 117 mL/min/{1.73_m2} (ref >59)

## 2023-06-25 LAB — HEMOGLOBIN A1C
Est. average glucose Bld gHb Est-mCnc: 114 mg/dL
Hgb A1c MFr Bld: 5.6 % (ref 4.8–5.6)

## 2023-06-25 LAB — IRON,TIBC AND FERRITIN PANEL
Ferritin: 79 ng/mL (ref 15–150)
Iron Saturation: 18 % (ref 15–55)
Iron: 51 ug/dL (ref 27–159)
Total Iron Binding Capacity: 281 ug/dL (ref 250–450)
UIBC: 230 ug/dL (ref 131–425)

## 2023-06-25 LAB — VITAMIN B12: Vitamin B-12: 1669 pg/mL — ABNORMAL HIGH (ref 232–1245)

## 2023-06-27 ENCOUNTER — Encounter (HOSPITAL_BASED_OUTPATIENT_CLINIC_OR_DEPARTMENT_OTHER): Payer: Self-pay | Admitting: Family Medicine

## 2023-06-29 ENCOUNTER — Ambulatory Visit
Admission: RE | Admit: 2023-06-29 | Discharge: 2023-06-29 | Disposition: A | Discharge status: Home / Self Care | Source: Ambulatory Visit | Attending: Family Medicine

## 2023-06-29 DIAGNOSIS — R42 Dizziness and giddiness: Secondary | ICD-10-CM

## 2023-06-29 MED ORDER — GADOPICLENOL 0.5 MMOL/ML IV SOLN
7.0000 mL | Freq: Once | INTRAVENOUS | Status: AC | PRN
  Administered 2023-06-29: 7 mL via INTRAVENOUS

## 2023-06-29 MED ORDER — GADOPICLENOL 0.5 MMOL/ML IV SOLN: 8.0000 mL | Freq: Once | INTRAVENOUS | Status: DC | PRN

## 2023-07-01 ENCOUNTER — Other Ambulatory Visit (HOSPITAL_BASED_OUTPATIENT_CLINIC_OR_DEPARTMENT_OTHER): Payer: Self-pay

## 2023-07-01 ENCOUNTER — Encounter (HOSPITAL_BASED_OUTPATIENT_CLINIC_OR_DEPARTMENT_OTHER): Payer: Self-pay | Admitting: Family Medicine

## 2023-07-01 ENCOUNTER — Inpatient Hospital Stay (HOSPITAL_BASED_OUTPATIENT_CLINIC_OR_DEPARTMENT_OTHER): Admitting: Family Medicine

## 2023-07-01 DIAGNOSIS — H9203 Otalgia, bilateral: Secondary | ICD-10-CM

## 2023-07-01 DIAGNOSIS — R42 Dizziness and giddiness: Secondary | ICD-10-CM

## 2023-07-02 ENCOUNTER — Ambulatory Visit (HOSPITAL_BASED_OUTPATIENT_CLINIC_OR_DEPARTMENT_OTHER): Admitting: Family Medicine

## 2023-07-09 ENCOUNTER — Telehealth (HOSPITAL_BASED_OUTPATIENT_CLINIC_OR_DEPARTMENT_OTHER): Admitting: Family Medicine

## 2023-07-09 ENCOUNTER — Encounter (HOSPITAL_BASED_OUTPATIENT_CLINIC_OR_DEPARTMENT_OTHER): Payer: Self-pay | Admitting: Family Medicine

## 2023-07-10 ENCOUNTER — Other Ambulatory Visit (HOSPITAL_BASED_OUTPATIENT_CLINIC_OR_DEPARTMENT_OTHER): Payer: Self-pay | Admitting: Family Medicine

## 2023-07-10 DIAGNOSIS — R42 Dizziness and giddiness: Secondary | ICD-10-CM

## 2023-07-11 ENCOUNTER — Emergency Department (HOSPITAL_BASED_OUTPATIENT_CLINIC_OR_DEPARTMENT_OTHER)
Admission: EM | Admit: 2023-07-11 | Discharge: 2023-07-11 | Disposition: A | Discharge status: Home / Self Care | Attending: Emergency Medicine | Admitting: Emergency Medicine

## 2023-07-11 ENCOUNTER — Emergency Department (HOSPITAL_BASED_OUTPATIENT_CLINIC_OR_DEPARTMENT_OTHER)

## 2023-07-11 ENCOUNTER — Encounter (HOSPITAL_BASED_OUTPATIENT_CLINIC_OR_DEPARTMENT_OTHER): Payer: Self-pay

## 2023-07-11 ENCOUNTER — Ambulatory Visit (INDEPENDENT_AMBULATORY_CARE_PROVIDER_SITE_OTHER): Admitting: Otolaryngology

## 2023-07-11 ENCOUNTER — Encounter (INDEPENDENT_AMBULATORY_CARE_PROVIDER_SITE_OTHER): Payer: Self-pay

## 2023-07-11 ENCOUNTER — Emergency Department (HOSPITAL_BASED_OUTPATIENT_CLINIC_OR_DEPARTMENT_OTHER): Admitting: Radiology

## 2023-07-11 ENCOUNTER — Other Ambulatory Visit: Payer: Self-pay

## 2023-07-11 ENCOUNTER — Ambulatory Visit (INDEPENDENT_AMBULATORY_CARE_PROVIDER_SITE_OTHER): Admitting: Audiology

## 2023-07-11 VITALS — Ht 66.0 in | Wt 186.0 lb

## 2023-07-11 DIAGNOSIS — G43909 Migraine, unspecified, not intractable, without status migrainosus: Secondary | ICD-10-CM | POA: Diagnosis not present

## 2023-07-11 DIAGNOSIS — R2689 Other abnormalities of gait and mobility: Secondary | ICD-10-CM

## 2023-07-11 DIAGNOSIS — R5383 Other fatigue: Secondary | ICD-10-CM

## 2023-07-11 DIAGNOSIS — R42 Dizziness and giddiness: Secondary | ICD-10-CM

## 2023-07-11 DIAGNOSIS — R079 Chest pain, unspecified: Secondary | ICD-10-CM | POA: Insufficient documentation

## 2023-07-11 DIAGNOSIS — G43809 Other migraine, not intractable, without status migrainosus: Secondary | ICD-10-CM

## 2023-07-11 DIAGNOSIS — H93291 Other abnormal auditory perceptions, right ear: Secondary | ICD-10-CM | POA: Diagnosis not present

## 2023-07-11 DIAGNOSIS — R0602 Shortness of breath: Secondary | ICD-10-CM | POA: Diagnosis present

## 2023-07-11 LAB — COMPREHENSIVE METABOLIC PANEL
ALT: 10 U/L (ref 0–44)
AST: 12 U/L — ABNORMAL LOW (ref 15–41)
Albumin: 4.1 g/dL (ref 3.5–5.0)
Alkaline Phosphatase: 49 U/L (ref 38–126)
Anion gap: 7 (ref 5–15)
BUN: 11 mg/dL (ref 6–20)
CO2: 26 mmol/L (ref 22–32)
Calcium: 8.8 mg/dL — ABNORMAL LOW (ref 8.9–10.3)
Chloride: 104 mmol/L (ref 98–111)
Creatinine, Ser: 0.94 mg/dL (ref 0.44–1.00)
GFR, Estimated: >60 mL/min (ref >60)
Glucose, Bld: 98 mg/dL (ref 70–99)
Potassium: 3.6 mmol/L (ref 3.5–5.1)
Sodium: 137 mmol/L (ref 135–145)
Total Bilirubin: 0.2 mg/dL (ref <1.2)
Total Protein: 7.4 g/dL (ref 6.5–8.1)

## 2023-07-11 LAB — CBC WITH DIFFERENTIAL/PLATELET
Abs Immature Granulocytes: 0.03 10*3/uL (ref 0.00–0.07)
Basophils Absolute: 0 10*3/uL (ref 0.0–0.1)
Basophils Relative: 0 %
Eosinophils Absolute: 0.2 10*3/uL (ref 0.0–0.5)
Eosinophils Relative: 3 %
HCT: 38.6 % (ref 36.0–46.0)
Hemoglobin: 12.7 g/dL (ref 12.0–15.0)
Immature Granulocytes: 0 %
Lymphocytes Relative: 36 %
Lymphs Abs: 2.8 10*3/uL (ref 0.7–4.0)
MCH: 24.6 pg — ABNORMAL LOW (ref 26.0–34.0)
MCHC: 32.9 g/dL (ref 30.0–36.0)
MCV: 74.7 fL — ABNORMAL LOW (ref 80.0–100.0)
Monocytes Absolute: 0.5 10*3/uL (ref 0.1–1.0)
Monocytes Relative: 6 %
Neutro Abs: 4.2 10*3/uL (ref 1.7–7.7)
Neutrophils Relative %: 55 %
Platelets: 281 10*3/uL (ref 150–400)
RBC: 5.17 MIL/uL — ABNORMAL HIGH (ref 3.87–5.11)
RDW: 14.5 % (ref 11.5–15.5)
WBC: 7.8 10*3/uL (ref 4.0–10.5)
nRBC: 0 % (ref 0.0–0.2)

## 2023-07-11 LAB — TROPONIN I (HIGH SENSITIVITY): Troponin I (High Sensitivity): <2 ng/L (ref <18)

## 2023-07-11 LAB — D-DIMER, QUANTITATIVE: D-Dimer, Quant: 0.88 ug{FEU}/mL — ABNORMAL HIGH (ref 0.00–0.50)

## 2023-07-11 MED ORDER — IOHEXOL 350 MG/ML SOLN
100.0000 mL | Freq: Once | INTRAVENOUS | Status: AC | PRN
  Administered 2023-07-11: 75 mL via INTRAVENOUS

## 2023-07-11 NOTE — Progress Notes (Addendum)
  [address], [address] [place], [place] [address] [phone]  Audiological Evaluation    Name: Shannon Huerta     DOB:   February 12, 1991      MRN:   [id number]                                                                                     Service Date: 07/11/2023     Accompanied by: none   Patient comes today after Dr. Allena Katz, ENT sent a referral for a hearing evaluation due to concerns with dizziness.   Symptoms Yes Details  Hearing loss  []    Tinnitus  [x]  Tinnitus in both ears.  Ear pain/ Ear infections  [x]  Reports an sudden sharp pain in both ears - once or twice a day.  Balance problems  [x]  Reports that movements make her feel a sway or movement.  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 Reports frequent headaches and some migraines.    Otoscopy: Right ear: Clear external ear canals and notable landmarks visualized on the tympanic membrane. Left ear:  Clear external ear canals and notable landmarks visualized on the tympanic membrane.  Tympanometry: Right ear: Type A- Normal external ear canal volume with normal middle ear pressure and tympanic membrane compliance. Left ear: Type A- Normal external ear canal volume with normal middle ear pressure and tympanic membrane compliance.  Pure tone Audiometry: Normal hearing from [phone], in both ears.    The hearing test results were completed under headphones and re-checked with inserts and results are deemed to be of good reliability. Test technique:  conventional     Speech Audiometry: Right ear- Speech Reception Threshold (SRT) was obtained at 10 dBHL. Left ear-Speech Reception Threshold (SRT) was obtained at 10 dBHL.   Word Recognition Score Tested using NU-6 (MLV) Right ear: 100% was obtained at a presentation level of 60 dBHL with contralateral masking which is  deemed as  excellent. Left ear: 100% was obtained at a presentation level of 60 dBHL with contralateral masking which is deemed as  excellent.   Of note, could not test DPOAE's because the equipment is not calibrated.   Recommendations: Follow up with ENT as scheduled for today. Return for a hearing evaluation if concerns with hearing changes arise or per MD recommendation. Consider various tinnitus strategies, including the use of a sound generator or tinnitus retraining therapy at [facility] , if it becomes bothersome.   Yecheskel Kurek MARIE LEROUX-MARTINEZ, AUD

## 2023-07-11 NOTE — ED Provider Notes (Signed)
[place] EMERGENCY DEPARTMENT AT [facility] Provider Note   CSN: [id number] Arrival date & time: 07/11/23  2019     History Chief Complaint  Patient presents with   Shortness of Breath    HPI Shannon Huerta is a 32 y.o. female presenting for multiple complaints.  States that she is been lightheaded and dizzy for 2 weeks.  She was seen by her PCP who is uncertain of the episodes suspect vertigo but referred to ENT and neurology.  Saw ENT today with a benign exam.  Was discharged to outpatient follow-up. However tonight she became suddenly acutely dyspneic and had an episode of chest pain just prior to arrival.  States she was helping her kid get ready for the bath when it started suddenly.  No history of thromboembolic disease but states that her rheumatologist told her she was at "high risk" for DVT or PE though patient cannot qualify this further. She does not smoke and has no family history of heart disease that she can recall. .   Patient's recorded medical, surgical, social, medication list and allergies were reviewed in the Snapshot window as part of the initial history.   Review of Systems   Review of Systems  Constitutional:  Negative for chills and fever.  HENT:  Negative for ear pain and sore throat.   Eyes:  Negative for pain and visual disturbance.  Respiratory:  Positive for shortness of breath. Negative for cough.   Cardiovascular:  Positive for chest pain. Negative for palpitations.  Gastrointestinal:  Negative for abdominal pain and vomiting.  Genitourinary:  Negative for dysuria and hematuria.  Musculoskeletal:  Negative for arthralgias and back pain.  Skin:  Negative for color change and rash.  Neurological:  Negative for seizures and syncope.  All other systems reviewed and are negative.   Physical Exam Updated Vital Signs BP 116/78   Pulse 94   Temp 98.1 F (36.7 C) (Oral)   Resp 19   LMP 06/09/2023 (Exact Date)   SpO2 100%  Physical  Exam Vitals and nursing note reviewed.  Constitutional:      General: She is not in acute distress.    Appearance: She is well-developed. She is not ill-appearing or toxic-appearing.  HENT:     Head: Normocephalic and atraumatic.  Eyes:     Extraocular Movements: Extraocular movements intact.     Conjunctiva/sclera: Conjunctivae normal.     Pupils: Pupils are equal, round, and reactive to light.  Cardiovascular:     Rate and Rhythm: Normal rate and regular rhythm.     Heart sounds: No murmur heard. Pulmonary:     Effort: Pulmonary effort is normal. No respiratory distress.     Breath sounds: Normal breath sounds.  Abdominal:     General: Abdomen is flat.     Palpations: Abdomen is soft.     Tenderness: There is no abdominal tenderness.  Musculoskeletal:        General: No swelling, deformity or signs of injury.     Cervical back: Normal range of motion and neck supple. No rigidity.  Skin:    General: Skin is warm and dry.     Capillary Refill: Capillary refill takes less than 2 seconds.  Neurological:     General: No focal deficit present.     Mental Status: She is alert and oriented to person, place, and time.  Psychiatric:        Mood and Affect: Mood normal.      ED  Course/ Medical Decision Making/ A&P Clinical Course as of 07/11/23 2314  Thu Jul 11, 2023  2043 Lightheaded and dizzy for 2 weeks.  Seen ENT checked out OK  PCP Uncertain. Referred to neurology  At bathtime she suddenly became acutely dyspneic.   Chest pain and SOB.   No history of PE/DVT  No smoking.    [CC]    Clinical Course User Index [CC] Glyn Ade, MD    Procedures Procedures   Medications Ordered in ED Medications  iohexol (OMNIPAQUE) 350 MG/ML injection 100 mL (75 mLs Intravenous Contrast Given 07/11/23 2140)   Medical Decision Making: Shannon Huerta is a 32 y.o. female who presented to the ED today with chest pain, detailed above.  Based on patient's comorbidities,  patient has a heart score of 2.    Patient placed on continuous vitals and telemetry monitoring while in ED which was reviewed periodically.  Complete initial physical exam performed, notably the patient was HDS in NAD.   Reviewed and confirmed nursing documentation for past medical history, family history, social history.    Initial Assessment: With the patient's presentation of left-sided chest pain, most likely diagnosis is musculoskeletal chest pain versus GERD, although ACS remains on the differential. Other diagnoses were considered including (but not limited to) pulmonary embolism, community-acquired pneumonia, aortic dissection, pneumothorax, underlying bony abnormality, anemia. These are considered less likely due to history of present illness and physical exam findings.    In particular, concerning pulmonary embolism: Patient is PERC + for HR and the they deny malignancy, recent surgery, history of DVT, or calf tenderness leading to a low risk Wells score. Aortic Dissection also reconsidered but seems less likely based on the location, quality, onset, and severity of symptoms in this case. Patient has a lack of serious comorbidities for this condition including a lack of HTN or Smoking. Patient also has a lack of underlying history of AD or TAA.  This is most consistent with an acute life/limb threatening illness complicated by underlying chronic conditions.   Initial Plan: Evaluate for ACS with delta troponin and EKG evaluated as below  Evaluate for dissection, bony abnormality, or pneumonia with chest x-ray and screening laboratory evaluation including CBC, BMP  Further evaluation for pulmonary embolism  indicated at this time based on patient's PERC and Wells score.  Will proceed with D-dimer and CTA of positive Further evaluation for Thoracic Aortic Dissection not indicated at this time based on patient's clinical history and PE findings.   Initial Study Results: EKG was reviewed  independently. Rate, rhythm, axis, intervals all examined and without medically relevant abnormality. ST segments without concerns for elevations.    Laboratory  Delta troponin demonstrated NAA   CBC and BMP without obvious metabolic or inflammatory abnormalities requiring further evaluation   Radiology  CT Angio Chest Pulmonary Embolism (PE) W or WO Contrast  Result Date: 07/11/2023 CLINICAL DATA:  Pulmonary embolism suspected, high probability. Dizziness, shortness of breath on exertion. EXAM: CT ANGIOGRAPHY CHEST WITH CONTRAST TECHNIQUE: Multidetector CT imaging of the chest was performed using the standard protocol during bolus administration of intravenous contrast. Multiplanar CT image reconstructions and MIPs were obtained to evaluate the vascular anatomy. RADIATION DOSE REDUCTION: This exam was performed according to the departmental dose-optimization program which includes automated exposure control, adjustment of the mA and/or kV according to patient size and/or use of iterative reconstruction technique. CONTRAST:  75mL OMNIPAQUE IOHEXOL 350 MG/ML SOLN COMPARISON:  None Available. FINDINGS: Cardiovascular: The heart is normal in size  and there is no pericardial effusion. The aorta is normal in caliber. The pulmonary trunk is mildly distended which may be associated with underlying pulmonary artery hypertension. No evidence of pulmonary embolism is seen. Mediastinum/Nodes: No mediastinal, hilar, or axillary lymphadenopathy. A 9 mm hypodense nodule is noted in the left lobe of the thyroid gland. No follow-up imaging is recommended. The trachea and esophagus are within normal limits. Lungs/Pleura: No consolidation, effusion, or pneumothorax. Upper Abdomen: No acute abnormality. Musculoskeletal: No acute osseous abnormality. Review of the MIP images confirms the above findings. IMPRESSION: No evidence of pulmonary embolism or other acute process. Electronically Signed   By: Thornell Sartorius M.D.   On:  07/11/2023 22:24   DG Chest 2 View  Result Date: 07/11/2023 CLINICAL DATA:  Shortness of breath. EXAM: CHEST - 2 VIEW COMPARISON:  None Available. FINDINGS: The heart size and mediastinal contours are within normal limits. Both lungs are clear. The visualized skeletal structures are unremarkable. IMPRESSION: No active cardiopulmonary disease. Electronically Signed   By: Annia Belt M.D.   On: 07/11/2023 21:18   MR Brain W Wo Contrast  Result Date: 06/29/2023 CLINICAL DATA:  Dizziness.  History of Chiari malformation. EXAM: MRI HEAD WITHOUT AND WITH CONTRAST TECHNIQUE: Multiplanar, multiecho pulse sequences of the brain and surrounding structures were obtained without and with intravenous contrast. CONTRAST:  7 cc Vueway COMPARISON:  head CT 04/03/2023. Neck CT 03/26/2023. Brain MRI 03/01/2022. FINDINGS: Brain: Again noted, the right cerebellar tonsil extends 4-5 mm through the foramen magnum, upper limits of normal. In most cases, this would not be of clinical relevance. Otherwise, the brain continues to have a normal appearance without evidence of stroke, mass, hemorrhage, hydrocephalus or extra-axial collection. After contrast administration, no abnormal enhancement occurs. Vascular: Major vessels at the base of the brain show flow. Skull and upper cervical spine: Negative Sinuses/Orbits: Clear/normal Other: None IMPRESSION: 1. No acute or reversible finding. No change since 03/01/2022. 2. Again noted, the right cerebellar tonsil extends 4-5 mm through the foramen magnum, upper limits of normal. In most cases, this would not be of clinical relevance. Electronically Signed   By: Paulina Fusi M.D.   On: 06/29/2023 17:30    Final Assessment and Plan: Patient's history of present on his physical exam findings are not consistent with any acute pathology.  Symptoms all resolved on my serial reassessments  Disposition:  I have considered need for hospitalization, however, considering all of the above, I  believe this patient is stable for discharge at this time.  Patient/family educated about specific return precautions for given chief complaint and symptoms.  Patient/family educated about follow-up with PCP .     Patient/family expressed understanding of return precautions and need for follow-up. Patient spoken to regarding all imaging and laboratory results and appropriate follow up for these results. All education provided in verbal form with additional information in written form. Time was allowed for answering of patient questions. Patient discharged.    Emergency Department Medication Summary:   Medications  iohexol (OMNIPAQUE) 350 MG/ML injection 100 mL (75 mLs Intravenous Contrast Given 07/11/23 2140)        Clinical Impression:  1. Chest pain, unspecified type      Data Unavailable   Final Clinical Impression(s) / ED Diagnoses Final diagnoses:  Chest pain, unspecified type    Rx / DC Orders ED Discharge Orders     None         Glyn Ade, MD 07/11/23 2314

## 2023-07-11 NOTE — ED Notes (Signed)
Patient presents to ED reporting shortness of breath, feeling faint, and weakness for three weeks. Patient reports symptoms worsened over the last few days. Patient speaking in full complete sentences, airway intact, nonlabored and even respirations.

## 2023-07-11 NOTE — Progress Notes (Signed)
Dear Dr. Charm Barges, Here is my assessment for our mutual patient, Shannon Huerta. Thank you for allowing me the opportunity to care for your patient. Please do not hesitate to contact me should you have any other questions. Sincerely, Dr. Jovita Kussmaul  Otolaryngology Clinic Note Referring provider: Dr. Charm Barges HPI:  Shannon Huerta is a 32 y.o. female kindly referred by Dr. Charm Barges for evaluation of imbalance. Initial visit (07/2023): Patient reports: for about 2 years, she reports that she has been "dizzy" constantly and getting worse -- she does not feel like the room is spinning vertigo. She just feels like there is an imbalance. She reports this has been getting worse - for example, while bending down and reach a cabinet, driving and turning around, now even eyes moving is making her dizzy. Triggers include valsalva maneuvers (blowing balloons etc.) and following a moving object or even starting at the screen. No hearing change, does have some noise sensitivity. Associated symptoms include "shaky eyes," severe nausea, rare random ear sharp pain, and some tinnitus bilaterally. She reports that her eyes just feel tired, strained and she wants to close them during these episodes. She denies drainage, fullness. Loud noises or pressure changes do not cause the episodes. She has also had some syncopal episodes - not related to these episodes -- but reports that these episodes remind her of syncopal episodes. She has seen a Neurologist who is treating her for migraine and it did not help. Nothing helps. No recent medication changes except discontinuation of prednisone (taking for urticaria)  She has tried Meclizine, Dramamine and Xanax and Rizatriptan -- does not help.  Patient also denies barotrauma, current vestibular suppressant use, ototoxic medication use No recent audio, no imaging  H&N Surgery: no Personal or FHx of bleeding dz or anesthesia difficulty: no  AP/AC: no  Tobacco: no. 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, Anxiety, Allergies (on xolair and singulair), Beta Thalassemia  Independent Review of Additional Tests or Records:  07/2023 Mckinley Jewel was independently reviewed and interpreted by me and it reveals AU: normal hearing thresholds; 100% WRT at 60 dB; A/A tymps    SNHL= Sensorineural hearing loss  MRI Brain 06/2023 independently reviewed: negative; no mastoid effusion or otic pathology - specifically no retrocochlear lesions CTH and Neck 03/2023: attention to ears, no mastoid effusion and does not appear to have gross otic capsule abnormality; paranasal sinuses clear  Arnette Felts, PCP (01/2022): Noted to have some dizziness, started meclizing, and ordered MRI brain; also noted headahce Charm Barges FNP (04/02/2023): noted dizziness and headache, and palpitation; "feels intoxicated, weak, turning head dizzy, "bad hangover"; Fatigue; Rx: Cardiac workup and sent to ED 04/02/2023 ED: CT done for this, labs unremarkable, Rx: supportive Charm Barges FNP (06/2023): again similar symptoms, "She reports she has been experiencing an increase in dizziness that does not improve with meclizine or OTC dramamine. She reports her dizziness is consistent, never stops. She reports that movement of her head or eyes make it worse- also movement of her head or body causes dizziness to worsen. Turns her head too fast and gets dizzy and throbbing. She reports the throbbing does not happen every time- most often in her temple region. Throbbing doesn't last, is intermittent, but is noticeable with an increase in intra-abd pressure, such as bending over, straining with bowel movement, blowing balloon, attempting to complete her PFTs, and laughing too hard.  She first noticed symptoms of dizziness about 2 years  prior to Aug 2023; however, at that time, symptoms were not as severe. She now reports frequent nausea and constant dizziness. She reports  her eyes move side-to-side (nystagmus) if she stares at her phone or the tv for an extended period of time. She has noticed some intermittent tingling on the left side of her upper extremity. Denies weakness, change in speech. " No neuro Sx noted, no nystagmus with Dix-hallpike; Rx: repeat MRI, rizatriptan, benzo and zofran Neuro notes (Dr. Marjory Lies) - 03/2022: Headaches, pressure, pain; sensitive to light, blurred vision, nausea; lasting hours; MRI showed borderline chiari, Rx; Migraine = topamax, rizatriptan  PMH/Meds/All/SocHx/FamHx/ROS:   Past Medical History:  Diagnosis Date   Angio-edema    Angioedema 01/05/2020   Anxiety 2012   Asthma    Connective tissue disease (HCC)    Recurrent urticaria 01/05/2020   Urticaria      History reviewed. No pertinent surgical history.  Family History  Problem Relation Age of Onset   Lupus Mother    Asthma Mother    Urticaria Father    Healthy Father    Lupus Maternal Uncle      Social Connections: Not on file      Current Outpatient Medications:    albuterol (VENTOLIN HFA) 108 (90 Base) MCG/ACT inhaler, Inhale 2 puffs into the lungs every 6 (six) hours as needed for wheezing or shortness of breath., Disp: 8 g, Rfl: 2   ALPRAZolam (XANAX) 0.25 MG tablet, Take 1 tablet (0.25 mg total) by mouth 2 (two) times daily., Disp: 60 tablet, Rfl: 0   cetirizine (ZYRTEC) 10 MG tablet, Take 10 mg by mouth daily as needed for rhinitis or allergies., Disp: , Rfl:    EPINEPHrine 0.3 mg/0.3 mL IJ SOAJ injection, Inject 0.3 mg into the muscle as needed for anaphylaxis., Disp: 1 each, Rfl: 2   famotidine (PEPCID) 20 MG tablet, Take 1 tablet (20 mg total) by mouth 2 (two) times daily., Disp: 60 tablet, Rfl: 5   levocetirizine (XYZAL) 5 MG tablet, Take 1 tablet daily, can increase to 2 tablets twice daily as needed for hives.  This is maximum dose.  Do not take with other antihistamines such as hydroxyzine., Disp: 120 tablet, Rfl: 5   montelukast (SINGULAIR) 10  MG tablet, Take 1 tablet (10 mg total) by mouth at bedtime., Disp: 30 tablet, Rfl: 3   omalizumab (XOLAIR) 150 MG/ML prefilled syringe, Inject 300 mg into the skin every 14 (fourteen) days., Disp: 12 mL, Rfl: 3   ondansetron (ZOFRAN) 4 MG tablet, Take 1 tablet (4 mg total) by mouth every 8 (eight) hours as needed for nausea or vomiting., Disp: 20 tablet, Rfl: 0   amitriptyline (ELAVIL) 25 MG tablet, Take 1 tablet (25 mg total) by mouth at bedtime., Disp: 30 tablet, Rfl: 6   azelastine (ASTELIN) 0.1 % nasal spray, Place 1-2 sprays into both nostrils 2 (two) times daily as needed for rhinitis., Disp: 30 mL, Rfl: 5   Rimegepant Sulfate (NURTEC) 75 MG TBDP, Take 1 tablet (75 mg total) by mouth daily as needed., Disp: 8 tablet, Rfl: 6   rizatriptan (MAXALT-MLT) 10 MG disintegrating tablet, Take 1 tablet (10 mg total) by mouth as needed for migraine. May repeat in 2 hours if needed, Disp: 8 tablet, Rfl: 6  Current Facility-Administered Medications:    omalizumab Geoffry Paradise) injection 300 mg, 300 mg, Subcutaneous, Q14 Days, Verlee Monte, MD, 300 mg at 05/03/23 1430   omalizumab (XOLAIR) prefilled syringe 300 mg, 300 mg, Subcutaneous, Q14 Days,  Verlee Monte, MD, 300 mg at 06/03/23 1205   Physical Exam:   Ht 5\' 6"  (1.676 m)   Wt 186 lb (84.4 kg)   LMP 06/09/2023 (Exact Date)   BMI 30.02 kg/m   Salient findings:  CN II-XII intact No gross nystagmus; head shake negative  Bilateral EAC clear and TM intact with well pneumatized middle ear spaces Weber 512: mid Rinne 512: AC > BC b/l Anterior rhinoscopy: Septum relatively midline; no purulence noted No lesions of oral cavity/oropharynx No obviously palpable neck masses/lymphadenopathy/thyromegaly No respiratory distress or stridor  Seprately Identifiable Procedures:  None today  Impression & Plans:  Shaleen Longden is a 32 y.o. female with:  1. Vestibular migraine   2. Lightheadedness   3. Imbalance   4. Other fatigue    Based on her  complaints, history, review of imaging and examination, it appears that the most likely diagnosis appears to be some form of atypical migraine rather than a peripheral vertigo.  She is not having frank vertigo and does not report any hearing change or tinnitus or other significant otologic symptoms.  She has had a prior neurology evaluation and has tried multiple medications including some migraine abortive medications without significant benefit.  Still, given lack of other symptoms I do not think the peripheral vestibular system or ears are involved at this point. Regardless, given the amount of problems that she is having, this could be a central phenomena and we will order vestibular testing.  - f/u based on vestibular testing - Would like her to see her neurologist as well for further control re: migrianes  See below regarding exact medications prescribed this encounter including dosages and route: No orders of the defined types were placed in this encounter.     Thank you for allowing me the opportunity to care for your patient. Please do not hesitate to contact me should you have any other questions.  Sincerely, Jovita Kussmaul, MD Otolarynoglogist (ENT), [facility] Phone: [phone] Fax: [phone]  07/27/2023, 10:16 AM   I have personally spent 50 minutes involved in face-to-face and non-face-to-face activities for this patient on the day of the visit.  Professional time spent excludes any procedures performed but includes the following activities, in addition to those noted in the documentation: preparing to see the patient (review of outside documentation and results), performing a medically appropriate examination and/or evaluation, counseling and educating the patient/family/caregiver, ordering medications, referring and communicating with other healthcare professionals, documenting clinical information in the electronic or other health record, independently  interpreting results and communicating results with the patient/family/caregiver.

## 2023-07-11 NOTE — ED Triage Notes (Signed)
Pt "dizzy to the point I'm going to faint, my eyes keep bouncing L & R, SHOB on exertion." Advises she's seen specialist today to r/o vertigo. Neurologist appt Monday.   Hx mixed connective tissue disease, concern for blood clots

## 2023-07-12 ENCOUNTER — Encounter (HOSPITAL_BASED_OUTPATIENT_CLINIC_OR_DEPARTMENT_OTHER): Payer: Self-pay | Admitting: Family Medicine

## 2023-07-15 ENCOUNTER — Encounter (HOSPITAL_BASED_OUTPATIENT_CLINIC_OR_DEPARTMENT_OTHER): Payer: Self-pay | Admitting: Family Medicine

## 2023-07-16 ENCOUNTER — Inpatient Hospital Stay: Attending: Internal Medicine

## 2023-07-16 ENCOUNTER — Inpatient Hospital Stay: Admitting: Internal Medicine

## 2023-07-18 ENCOUNTER — Ambulatory Visit: Admitting: Diagnostic Neuroimaging

## 2023-07-18 ENCOUNTER — Telehealth (HOSPITAL_BASED_OUTPATIENT_CLINIC_OR_DEPARTMENT_OTHER): Admitting: Family Medicine

## 2023-07-18 ENCOUNTER — Encounter: Payer: Self-pay | Admitting: Diagnostic Neuroimaging

## 2023-07-18 VITALS — BP 122/82 | HR 86 | Ht 66.0 in | Wt 189.0 lb

## 2023-07-18 DIAGNOSIS — R42 Dizziness and giddiness: Secondary | ICD-10-CM | POA: Diagnosis not present

## 2023-07-18 DIAGNOSIS — G43809 Other migraine, not intractable, without status migrainosus: Secondary | ICD-10-CM | POA: Diagnosis not present

## 2023-07-18 MED ORDER — AMITRIPTYLINE HCL 25 MG PO TABS: 25.0000 mg | ORAL_TABLET | Freq: Every day | ORAL | 6 refills | Status: DC

## 2023-07-18 MED ORDER — RIZATRIPTAN BENZOATE 10 MG PO TBDP: 10.0000 mg | ORAL_TABLET | ORAL | 6 refills | Status: DC | PRN

## 2023-07-18 MED ORDER — NURTEC 75 MG PO TBDP: 75.0000 mg | ORAL_TABLET | Freq: Every day | ORAL | 6 refills | Status: DC | PRN

## 2023-07-18 NOTE — Progress Notes (Signed)
[facility]  PATIENT: Shannon Huerta DOB: [birth year]  REFERRING CLINICIAN: Alyson Reedy, FNP HISTORY FROM: patient  REASON FOR VISIT: new consult    HISTORICAL  CHIEF COMPLAINT:  Chief Complaint  Patient presents with   Follow-up    Patient in room #6 and alone. Patient states her headaches has gotten worse, where she believe she may have vertigo.    HISTORY OF PRESENT ILLNESS:   UPDATE (07/18/23, VRP): Since last visit, continues with almost daily headaches with migraine features (sens to light, neck pain, nausea, dizziness). Sometimes has dizziness / lightheadedness with positional trigger independent of headaches. Has seen ENT, NSGY, PCP. Rizatriptan slightly help with HA relief.   PRIOR HPI (03/23/22): 32 year old female here for evaluation of headaches and abnormal MRI.  For past 2 years patient is having intermittent pressure, throbbing headache and pain sensation in her neck, shoulders, behind her eyes.  Aching pain sensation.  Sensitive to light.  Blurred vision, double vision and nausea.  Symptoms can last hours at a time.  Was having headaches every other day.  Has been using Tylenol on a daily basis for a while but recently stopped due to concern of medication overuse headache.  Having some headaches associated with straining, bending and reaching over.  Had MRI of the brain which showed possible borderline Chiari malformation.  Patient had pregnancies in 2019 and 2020.  Headache started around that time.   REVIEW OF SYSTEMS: Full 14 system review of systems performed and negative with exception of: as per HPI.  ALLERGIES: No Known Allergies  HOME MEDICATIONS: Outpatient Medications Prior to Visit  Medication Sig Dispense Refill   albuterol (VENTOLIN HFA) 108 (90 Base) MCG/ACT inhaler Inhale 2 puffs into the lungs every 6 (six) hours as needed for wheezing or shortness of breath. 8 g 2   ALPRAZolam (XANAX) 0.25 MG tablet Take 1 tablet (0.25 mg  total) by mouth 2 (two) times daily. 60 tablet 0   azelastine (ASTELIN) 0.1 % nasal spray Place 1-2 sprays into both nostrils 2 (two) times daily as needed for rhinitis. 30 mL 5   cetirizine (ZYRTEC) 10 MG tablet Take 10 mg by mouth daily as needed for rhinitis or allergies.     EPINEPHrine 0.3 mg/0.3 mL IJ SOAJ injection Inject 0.3 mg into the muscle as needed for anaphylaxis. 1 each 2   famotidine (PEPCID) 20 MG tablet Take 1 tablet (20 mg total) by mouth 2 (two) times daily. 60 tablet 5   levocetirizine (XYZAL) 5 MG tablet Take 1 tablet daily, can increase to 2 tablets twice daily as needed for hives.  This is maximum dose.  Do not take with other antihistamines such as hydroxyzine. 120 tablet 5   montelukast (SINGULAIR) 10 MG tablet Take 1 tablet (10 mg total) by mouth at bedtime. 30 tablet 3   omalizumab (XOLAIR) 150 MG/ML prefilled syringe Inject 300 mg into the skin every 14 (fourteen) days. 12 mL 3   ondansetron (ZOFRAN) 4 MG tablet Take 1 tablet (4 mg total) by mouth every 8 (eight) hours as needed for nausea or vomiting. 20 tablet 0   rizatriptan (MAXALT-MLT) 10 MG disintegrating tablet Take 1 tablet (10 mg total) by mouth as needed for migraine. May repeat in 2 hours if needed 8 tablet 1   FeFum-FePoly-FA-B Cmp-C-Biot (INTEGRA PLUS) CAPS Take 1 capsule by mouth daily. (Patient not taking: Reported on 07/18/2023) 30 capsule 3   hydrOXYzine (ATARAX) 10 MG tablet Take 1 tablet (10 mg total)  by mouth at bedtime. (Patient not taking: Reported on 07/18/2023) 30 tablet 0   meclizine (ANTIVERT) 12.5 MG tablet Take 1 tablet (12.5 mg total) by mouth 3 (three) times daily as needed for dizziness. (Patient not taking: Reported on 07/18/2023) 30 tablet 0   traZODone (DESYREL) 50 MG tablet Take 0.5-1 tablets (25-50 mg total) by mouth at bedtime. (Patient not taking: Reported on 07/18/2023) 30 tablet 2   Facility-Administered Medications Prior to Visit  Medication Dose Route Frequency Provider Last Rate  Last Admin   omalizumab Geoffry Paradise) injection 300 mg  300 mg Subcutaneous Q14 Days Verlee Monte, MD   300 mg at 05/03/23 1430   omalizumab Geoffry Paradise) prefilled syringe 300 mg  300 mg Subcutaneous Q14 Days Verlee Monte, MD   300 mg at 06/03/23 1205    PAST MEDICAL HISTORY: Past Medical History:  Diagnosis Date   Angio-edema    Angioedema 01/05/2020   Anxiety 2012   Asthma    Connective tissue disease (HCC)    Recurrent urticaria 01/05/2020   Urticaria     PAST SURGICAL HISTORY: History reviewed. No pertinent surgical history.  FAMILY HISTORY: Family History  Problem Relation Age of Onset   Lupus Mother    Asthma Mother    Urticaria Father    Healthy Father    Lupus Maternal Uncle     SOCIAL HISTORY: Social History   Socioeconomic History   Marital status: Married    Spouse name: Not on file   Number of children: Not on file   Years of education: Not on file   Highest education level: Not on file  Occupational History   Not on file  Tobacco Use   Smoking status: Never    Passive exposure: Current   Smokeless tobacco: Never  Vaping Use   Vaping status: Never Used  Substance and Sexual Activity   Alcohol use: Yes    Comment: SOCIALLY   Drug use: Never   Sexual activity: Yes    Birth control/protection: None  Other Topics Concern   Not on file  Social History Narrative   Not on file   Social Drivers of Health   Financial Resource Strain: Not on file  Food Insecurity: Not on file  Transportation Needs: Not on file  Physical Activity: Not on file  Stress: Not on file  Social Connections: Not on file  Intimate Partner Violence: Not on file     PHYSICAL EXAM  GENERAL EXAM/CONSTITUTIONAL: Vitals:  Vitals:   07/18/23 1450  BP: 122/82  Pulse: 86  Weight: 189 lb (85.7 kg)  Height: 5\' 6"  (1.676 m)   Body mass index is 30.51 kg/m. Wt Readings from Last 3 Encounters:  07/18/23 189 lb (85.7 kg)  07/11/23 186 lb (84.4 kg)  06/24/23 186 lb 12.8 oz  (84.7 kg)   Patient is in no distress; well developed, nourished and groomed; neck is supple  CARDIOVASCULAR: Examination of carotid arteries is normal; no carotid bruits Regular rate and rhythm, no murmurs Examination of peripheral vascular system by observation and palpation is normal  EYES: Ophthalmoscopic exam of optic discs and posterior segments is normal; no papilledema or hemorrhages No results found.  MUSCULOSKELETAL: Gait, strength, tone, movements noted in Neurologic exam below  NEUROLOGIC: MENTAL STATUS:      No data to display         awake, alert, oriented to person, place and time recent and remote memory intact normal attention and concentration language fluent, comprehension intact, naming intact  fund of knowledge appropriate  CRANIAL NERVE:  2nd - no papilledema on fundoscopic exam 2nd, 3rd, 4th, 6th - pupils equal and reactive to light, visual fields full to confrontation, extraocular muscles intact, no nystagmus 5th - facial sensation symmetric 7th - facial strength symmetric 8th - hearing intact 9th - palate elevates symmetrically, uvula midline 11th - shoulder shrug symmetric 12th - tongue protrusion midline  MOTOR:  normal bulk and tone, full strength in the BUE, BLE  SENSORY:  normal and symmetric to light touch, temperature, vibration  COORDINATION:  finger-nose-finger, fine finger movements normal  REFLEXES:  deep tendon reflexes present and symmetric  GAIT/STATION:  narrow based gait     DIAGNOSTIC DATA (LABS, IMAGING, TESTING) - I reviewed patient records, labs, notes, testing and imaging myself where available.  Lab Results  Component Value Date   WBC 7.8 07/11/2023   HGB 12.7 07/11/2023   HCT 38.6 07/11/2023   MCV 74.7 (L) 07/11/2023   PLT 281 07/11/2023      Component Value Date/Time   NA 137 07/11/2023 2041   NA 140 06/24/2023 1101   K 3.6 07/11/2023 2041   CL 104 07/11/2023 2041   CO2 26 07/11/2023 2041    GLUCOSE 98 07/11/2023 2041   BUN 11 07/11/2023 2041   BUN 10 06/24/2023 1101   CREATININE 0.94 07/11/2023 2041   CREATININE 0.86 04/13/2023 1102   CALCIUM 8.8 (L) 07/11/2023 2041   PROT 7.4 07/11/2023 2041   PROT 7.0 03/19/2023 0953   ALBUMIN 4.1 07/11/2023 2041   ALBUMIN 4.2 03/19/2023 0953   AST 12 (L) 07/11/2023 2041   AST 12 (L) 04/13/2023 1102   ALT 10 07/11/2023 2041   ALT 13 04/13/2023 1102   ALKPHOS 49 07/11/2023 2041   BILITOT 0.2 07/11/2023 2041   BILITOT 0.3 04/13/2023 1102   GFRNONAA >60 07/11/2023 2041   GFRNONAA >60 04/13/2023 1102   GFRAA >60 03/07/2020 2054   No results found for: "CHOL", "HDL", "LDLCALC", "LDLDIRECT", "TRIG", "CHOLHDL" Lab Results  Component Value Date   HGBA1C 5.6 06/24/2023   Lab Results  Component Value Date   VITAMINB12 1,669 (H) 06/24/2023   Lab Results  Component Value Date   TSH 0.896 04/13/2023    05/16/21 thyroid u/s - Left lower thyroid nodule (labeled 2, 1.4 cm) meets criteria for surveillance, as designated by the newly established ACR TI-RADS criteria. Surveillance ultrasound study recommended to be performed annually up to 5 years.  03/01/22 MRI brain [I reviewed images myself. 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 -VRP]  - No evidence of acute intracranial abnormality. - The right cerebellar tonsil extends 4-5 mm below the level of foramen magnum. This is borderline by measurement criteria for a Chiari I malformation. No more than mild crowding at the level of the foramen magnum. - Otherwise unremarkable MRI appearance of the brain.  06/29/23 MRI brain w/wo  1. No acute or reversible finding. No change since 03/01/2022. 2. Again noted, the right cerebellar tonsil extends 4-5 mm through the foramen magnum, upper limits of normal. In most cases, this would not be of clinical relevance.   ASSESSMENT AND PLAN  32 y.o. year old female here with:  Meds tried: topiramate (itching),  propranolol, rizatriptan  Dx:  1. Vestibular migraine       PLAN:  POSITIONAL HEADACHES / BLURRED VISION / VERTIGO  - likely vestibular migraine phenomenon  MIGRAINE PREVENTION  LIFESTYLE CHANGES -Stop or avoid smoking -Decrease or avoid caffeine /  alcohol -Eat and sleep on a regular schedule -Exercise several times per week - start amitriptyline 25mg  at bedtime  MIGRAINE RESCUE  - ibuprofen, tylenol as needed - rizatriptan (Maxalt) 10mg  as needed for breakthrough headache; may repeat x 1 after 2 hours; max 2 tabs per day or 8 per month - nurtec 75mg  as needed for breakthrough headache  Orders Placed This Encounter  Procedures   Ambulatory referral to Physical Therapy   Meds ordered this encounter  Medications   Rimegepant Sulfate (NURTEC) 75 MG TBDP    Sig: Take 1 tablet (75 mg total) by mouth daily as needed.    Dispense:  8 tablet    Refill:  6   amitriptyline (ELAVIL) 25 MG tablet    Sig: Take 1 tablet (25 mg total) by mouth at bedtime.    Dispense:  30 tablet    Refill:  6   rizatriptan (MAXALT-MLT) 10 MG disintegrating tablet    Sig: Take 1 tablet (10 mg total) by mouth as needed for migraine. May repeat in 2 hours if needed    Dispense:  8 tablet    Refill:  6   Return in about 4 months (around 11/16/2023) for MyChart visit (15 min).    Suanne Marker, MD 07/18/2023, 3:23 PM Certified in Neurology, Neurophysiology and Neuroimaging  [facility] [address], [address] [place], [place] [address] [phone]

## 2023-07-18 NOTE — Patient Instructions (Signed)
  POSITIONAL HEADACHES / BLURRED VISION / VERTIGO  / DIZZINESS - likely vestibular migraine phenomenon - refer to vestibular therapy   MIGRAINE PREVENTION  LIFESTYLE CHANGES -Stop or avoid smoking -Decrease or avoid caffeine / alcohol -Eat and sleep on a regular schedule -Exercise several times per week - start amitriptyline 25mg  at bedtime   MIGRAINE RESCUE  - ibuprofen, tylenol as needed - rizatriptan (Maxalt) 10mg  as needed for breakthrough headache; may repeat x 1 after 2 hours; max 2 tabs per day or 8 per month - nurtec 75mg  as needed for breakthrough headache

## 2023-07-26 ENCOUNTER — Other Ambulatory Visit: Payer: Self-pay

## 2023-07-26 ENCOUNTER — Ambulatory Visit: Attending: Diagnostic Neuroimaging | Admitting: Physical Therapy

## 2023-07-26 ENCOUNTER — Encounter: Payer: Self-pay | Admitting: Physical Therapy

## 2023-07-26 ENCOUNTER — Encounter: Payer: Self-pay | Admitting: Diagnostic Neuroimaging

## 2023-07-26 ENCOUNTER — Encounter (HOSPITAL_BASED_OUTPATIENT_CLINIC_OR_DEPARTMENT_OTHER): Payer: Self-pay | Admitting: Family Medicine

## 2023-07-26 VITALS — BP 126/86 | HR 72

## 2023-07-26 DIAGNOSIS — M542 Cervicalgia: Secondary | ICD-10-CM | POA: Diagnosis present

## 2023-07-26 DIAGNOSIS — G43809 Other migraine, not intractable, without status migrainosus: Secondary | ICD-10-CM | POA: Diagnosis not present

## 2023-07-26 DIAGNOSIS — R42 Dizziness and giddiness: Secondary | ICD-10-CM | POA: Diagnosis present

## 2023-07-26 NOTE — Therapy (Signed)
OUTPATIENT PHYSICAL THERAPY VESTIBULAR EVALUATION     Patient Name: Shannon Huerta MRN: [id number] DOB:October 30, 1990, 32 y.o., female Today's Date: 07/26/2023  END OF SESSION:  PT End of Session - 07/26/23 0938     Visit Number 1    Number of Visits 5    Date for PT Re-Evaluation 08/23/23    Authorization Type [employer]    PT Start Time [phone]    PT Stop Time 1015    PT Time Calculation (min) 39 min    Equipment Utilized During Treatment --    Activity Tolerance Patient tolerated treatment well    Behavior During Therapy [facility] for tasks assessed/performed             Past Medical History:  Diagnosis Date   Angio-edema    Angioedema 01/05/2020   Anxiety 2012   Asthma    Connective tissue disease (HCC)    Recurrent urticaria 01/05/2020   Urticaria    History reviewed. No pertinent surgical history. 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   Gastroesophageal reflux disease 08/14/2022   Chronic fatigue 10/28/2020   Allergic rhinitis 01/05/2020   Mild intermittent asthma 01/05/2020   Chronic idiopathic constipation 10/08/2016    PCP: Alyson Reedy, FNP REFERRING PROVIDER: Anastasio Auerbach, MD  REFERRING DIAG: [phone] (ICD-10-CM) - Vestibular migraine  THERAPY DIAG:  Dizziness and giddiness - Plan: PT plan of care cert/re-cert  Cervicalgia - Plan: PT plan of care cert/re-cert  ONSET DATE: 07/18/2023 (referral date)  Rationale for Evaluation and Treatment: Rehabilitation  SUBJECTIVE:   SUBJECTIVE STATEMENT: Patient reports that she is not really sure what is going on, but was told it may be a vestibular migraine. Patient reports that when she moves her head, turning her eyes, and neck, she will get lightheaded and funny feeling. She reports that a few times  over the last few week on her R side she has gotten numbness and tingling on her R side but this is a new experience. She reports that she has experienced this feeling over the last 2 years. She reports that over the last 1.5 months symptoms seem to have gotten worse. Patient reports aggravating factors include light, loud noise, and yelling. She reports that she has avoided turning her head and neck and has some increased tightness in her neck. She feels like any time she strains she will also get a head pounding feeling. Patient reports that she is experiencing symptoms everyday, all day with only a little relief in morning and an hour after taking medication. Patient was prescribed on amitriptyline but only gets about an hour of relief.  Pt accompanied by: self + young daughter  PERTINENT HISTORY: thought to have chiari malformation thought possible but has since been revoked as not severe enough to be considered abnormal or explain clinical findings  PAIN:  Are you having pain? No - Reports feeling "woozy"  PRECAUTIONS: None  RED FLAGS: None   WEIGHT BEARING RESTRICTIONS: No  FALLS: Has patient fallen in last 6 months? 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  DIAGNOSTIC FINDINGS:    IMPRESSION MR Brain WO Contrast 07/11/2023: 1. No acute or reversible finding. No change since 03/01/2022. 2. Again noted, the right cerebellar tonsil extends 4-5 mm through the foramen magnum, upper limits of normal. In most cases, this would not be of clinical relevance.  COGNITION: Overall cognitive status: Within functional  limits for tasks assessed during eval but patient reports that when she is experiencing all of her symptoms it is hard to remember anything and hard to focus    SENSATION: When symptoms are severe notices numbness down R side   EDEMA:  None  POSTURE:  rounded shoulders  Cervical ROM:    Active A/PROM (deg) eval  Flexion WFL   Extension WFL - reports increased dizziness  Right lateral flexion WFL - reports whozziness and mild pull on L   Left lateral flexion WFL - reports whozziness going   Right rotation WFL - whooziness  Left rotation WFL - whooziness  (Blank rows = not tested)  PATIENT SURVEYS:  FOTO 45  VESTIBULAR ASSESSMENT:  GENERAL OBSERVATION: does not wear glasses or contacts   SYMPTOM BEHAVIOR:  Subjective history: see above Non-Vestibular symptoms: changes in vision, diplopia, neck pain, headaches, tinnitus, nausea/vomiting, and migraine symptoms Type of dizziness: Blurred Vision, Diplopia, Imbalance (Disequilibrium), Lightheadedness/Faint, and "Funny feeling in the head"  Frequency: constant over the last 2 year, best in the morning but worse once she starts moving  Duration: constant  Aggravating factors:  lights, head turns, loud noise, driving and looking different direction  Relieving factors:  medication improves the intensity for about an hour  Progression of symptoms: worse over last month and half  OCULOMOTOR EXAM:  Ocular Alignment: normal  Ocular ROM: No Limitations  Spontaneous Nystagmus: absent  Gaze-Induced Nystagmus: absent  Smooth Pursuits:  WFL but squinty and reports felt hard to control eyes  Saccades: intact  Convergence/Divergence: ~ 10 cm   Test of skew: very mild exotropia but nearly negligent   VESTIBULAR - OCULAR REFLEX:   Slow VOR: Normal - reports dizziness with up and down and side to side  VOR Cancellation: Normal  Head-Impulse Test: HIT Right: negative HIT Left: negative  Dynamic Visual Acuity: Static: 8 Dynamic:  8 Results: WFL   POSITIONAL TESTING: Not indicated  MOTION SENSITIVITY:  Motion Sensitivity Quotient Intensity: 0 = none, 1 = Lightheaded, 2 = Mild, 3 = Moderate, 4 = Severe, 5 = Vomiting  Intensity  1. Sitting to supine   2. Supine to L side   3. Supine to R side   4. Supine to sitting   5. L Hallpike-Dix   6. Up from L    7. R Hallpike-Dix   8. Up from R    9. Sitting, head tipped to L knee   10. Head up from L knee   11. Sitting, head tipped to R knee   12. Head up from R knee   13. Sitting head turns x5   14.Sitting head nods x5   15. In stance, 180 turn to L    16. In stance, 180 turn to R     MSQ not captured on eval as patient mid migraine  TREATMENT DATE:   TherAct: Discussed etiology of vestibular migraine and fact that response to therapy is mixed and not typically helpful if patient in middle of migraine, discussed follow up with optometrist given convergence to determine if any visual deficits going on, discussed importance of medication management to help relieve symptoms, discussed symptom logging and diet modifications   PATIENT EDUCATION: Education details:  Person educated: Patient Education method: Explanation Education comprehension: verbalized understanding  HOME EXERCISE PROGRAM:  Recommended starting symptom log  GOALS: Goals reviewed with patient? Yes  LONG TERM GOALS: Target date: 08/23/2023 (STG = LTG due to POC length)   Patient will report demonstrate independence with final HEP in order to maintain current gains and continue to progress after physical therapy discharge.   Baseline: To be provided Goal status: INITIAL  2.  Patient will improve convergence to < 8 centimeters to indicate improvement in ocular synchronicity.  Baseline: ~10 cenitmeters Goal status: INITIAL  3.  MSQ to be performed as indicated   Baseline: To be performed Goal status: INITIAL   ASSESSMENT:  CLINICAL IMPRESSION: Patient is a 32 y.o. female who was seen today for physical therapy evaluation and treatment for vestibular impairments related to suspected vestibular migraine. Patient has started on medication but has minimal relief at this time. Patient with very poor convergence and recommend follow up with optometrist as patient has not seen one in recent years to assess for any underlying visual deficits fixable with corrective lenses. Patient with high reports of dizziness with head turns and unsure of triggers though reports increase in symptoms with loud sounds, light, and head turns. Patient will benefit from skilled physical therapy services for education on migraine management as well as habituation as tolerated.   OBJECTIVE IMPAIRMENTS: decreased activity tolerance, dizziness, impaired sensation, and pain.   ACTIVITY LIMITATIONS: bending, reach over head, and caring for others  PARTICIPATION LIMITATIONS: meal prep, cleaning, laundry, community activity, and yard work  PERSONAL FACTORS: Age, Time since onset of injury/illness/exacerbation, and 1 comorbidity: see above  are also affecting patient's functional outcome.   REHAB POTENTIAL: Fair may be limited by severity of migraines  CLINICAL DECISION MAKING: Evolving/moderate complexity  EVALUATION COMPLEXITY: Moderate   PLAN:  PT FREQUENCY: 2x/week  PT DURATION: 2 weeks  PLANNED INTERVENTIONS: 97164- PT Re-evaluation, 97110-Therapeutic exercises, 97530- Therapeutic activity, 97112- Neuromuscular re-education, 97535- Self Care, 95284- Manual therapy, [phone]- Gait training, [phone]- Canalith repositioning, Dry Needling, and Vestibular training  PLAN FOR NEXT SESSION: assess MSQ if tolerated, provide vestibular migraine resource + education, did patient follow up with optometrist, occulomotor exercises + VOR if tolerated, possible D/C if cannot tolerate due to  intensity of migraines, cervical stretching for pain/tightness if tolerated    Carmelia Bake, PT 07/26/2023, 1:10 PM

## 2023-07-29 ENCOUNTER — Other Ambulatory Visit (HOSPITAL_BASED_OUTPATIENT_CLINIC_OR_DEPARTMENT_OTHER): Payer: Self-pay | Admitting: Family Medicine

## 2023-07-29 MED ORDER — FLUCONAZOLE 150 MG PO TABS
150.0000 mg | ORAL_TABLET | Freq: Once | ORAL | 0 refills | Status: AC
Start: 2023-07-29 — End: 2023-07-29

## 2023-07-29 MED ORDER — CLINDAMYCIN PHOSPHATE 2 % VA CREA: 1.0000 | TOPICAL_CREAM | Freq: Every day | VAGINAL | 0 refills | Status: AC

## 2023-07-29 MED ORDER — METRONIDAZOLE 500 MG PO TABS: 500.0000 mg | ORAL_TABLET | Freq: Two times a day (BID) | ORAL | 0 refills | Status: AC

## 2023-07-30 ENCOUNTER — Telehealth: Payer: Self-pay | Admitting: *Deleted

## 2023-07-30 ENCOUNTER — Telehealth: Payer: Self-pay

## 2023-07-30 ENCOUNTER — Other Ambulatory Visit (HOSPITAL_COMMUNITY): Payer: Self-pay

## 2023-07-30 NOTE — Telephone Encounter (Signed)
PA submitted as is-  Pharmacy Patient Advocate Encounter   Received notification from Physician's Office that prior authorization for Nurtec 75MG  dispersible tablets is required/requested.   Insurance verification completed.   The patient is insured through [employer] .   Per test claim: PA required; PA submitted to above mentioned insurance via CoverMyMeds Key/confirmation #/EOC [id number] Status is pending

## 2023-07-30 NOTE — Telephone Encounter (Signed)
   [employer] insurance would like for PT to have tried "TWO" Triptans-I see PT has tried Rizatriptan. Did you wish to have PT try one of the above Triptans first or wish to proceed with PA as is? Does PT have any contraindication to Triptans? Please advise-Thanks.

## 2023-07-30 NOTE — Telephone Encounter (Signed)
Need PA for NURTEC.

## 2023-07-30 NOTE — Telephone Encounter (Signed)
NURTEC needs a PA please.

## 2023-07-30 NOTE — Telephone Encounter (Signed)
PA request has been Submitted. New Encounter created for follow up. For additional info see Pharmacy Prior Auth telephone encounter from 07/30/2023.

## 2023-08-01 ENCOUNTER — Other Ambulatory Visit (HOSPITAL_BASED_OUTPATIENT_CLINIC_OR_DEPARTMENT_OTHER): Payer: Self-pay | Admitting: *Deleted

## 2023-08-01 DIAGNOSIS — R42 Dizziness and giddiness: Secondary | ICD-10-CM

## 2023-08-01 MED ORDER — ONDANSETRON HCL 4 MG PO TABS: 4.0000 mg | ORAL_TABLET | Freq: Three times a day (TID) | ORAL | 0 refills | Status: DC | PRN

## 2023-08-01 NOTE — Telephone Encounter (Signed)
Pharmacy Patient Advocate Encounter  Received notification from [employer] that Prior Authorization for Nurtec 75MG  dispersible tablets has been DENIED.  Full denial letter will be uploaded to the media tab. See denial reason below.    PA #/Case ID/Reference #: PA Case ID #: [id number]

## 2023-08-06 ENCOUNTER — Other Ambulatory Visit (HOSPITAL_COMMUNITY): Payer: Self-pay

## 2023-08-06 ENCOUNTER — Telehealth: Payer: Self-pay

## 2023-08-06 ENCOUNTER — Ambulatory Visit: Admitting: Physical Therapy

## 2023-08-06 MED ORDER — ELETRIPTAN HYDROBROMIDE 40 MG PO TABS: 40.0000 mg | ORAL_TABLET | ORAL | 0 refills | Status: DC | PRN

## 2023-08-06 NOTE — Telephone Encounter (Signed)
 Pharmacy Patient Advocate Encounter  Received notification from [employer] that Prior Authorization for Eletriptan  Hydrobromide 40MG  tablets has been APPROVED from 07/07/2023 to 08/16/2023. Unable to obtain price due to refill too soon rejection, last fill date 08/06/2023 next available fill date1/10/2023   PA #/Case ID/Reference #: PA Case ID #: [id number]

## 2023-08-06 NOTE — Telephone Encounter (Signed)
 Will try eletriptan . If not working, then appeal for nurtec.  Meds ordered this encounter  Medications   eletriptan  (RELPAX ) 40 MG tablet    Sig: Take 1 tablet (40 mg total) by mouth as needed for migraine or headache. May repeat in 2 hours if headache persists or recurs.    Dispense:  10 tablet    Refill:  0   EDUARD FABIENE HANLON, MD 08/06/2023, 12:20 PM Certified in Neurology, Neurophysiology and Neuroimaging  [facility] [address] [place], [place] [address] [phone]

## 2023-08-09 ENCOUNTER — Telehealth: Payer: Self-pay | Admitting: Physical Therapy

## 2023-08-09 ENCOUNTER — Ambulatory Visit: Attending: Diagnostic Neuroimaging | Admitting: Physical Therapy

## 2023-08-09 NOTE — Telephone Encounter (Signed)
 Called pt regarding no show PT appt and had to leave voicemail. Reminded pt of next appt time. Stated that if pt does not want to come to therapy, then to please give our office a call and we will cancel all future appts.    Sheffield Senate, PT, DPT 08/09/23 11:33 AM    [facility] [address] [place], [place]  [address] Phone:  [phone] Fax:  [phone]

## 2023-08-13 ENCOUNTER — Encounter (HOSPITAL_BASED_OUTPATIENT_CLINIC_OR_DEPARTMENT_OTHER): Payer: Self-pay | Admitting: Emergency Medicine

## 2023-08-13 ENCOUNTER — Other Ambulatory Visit: Payer: Self-pay

## 2023-08-13 ENCOUNTER — Emergency Department (HOSPITAL_BASED_OUTPATIENT_CLINIC_OR_DEPARTMENT_OTHER)
Admission: EM | Admit: 2023-08-13 | Discharge: 2023-08-13 | Discharge status: Against Medical Advice | Attending: Emergency Medicine | Admitting: Emergency Medicine

## 2023-08-13 DIAGNOSIS — R42 Dizziness and giddiness: Secondary | ICD-10-CM | POA: Insufficient documentation

## 2023-08-13 DIAGNOSIS — Z5321 Procedure and treatment not carried out due to patient leaving prior to being seen by health care provider: Secondary | ICD-10-CM | POA: Insufficient documentation

## 2023-08-13 DIAGNOSIS — R519 Headache, unspecified: Secondary | ICD-10-CM | POA: Diagnosis not present

## 2023-08-13 DIAGNOSIS — R55 Syncope and collapse: Secondary | ICD-10-CM | POA: Diagnosis present

## 2023-08-13 NOTE — ED Triage Notes (Signed)
 States had near syncope episode today around 1500. Endorses headache and extreme dizziness at the time of episode.

## 2023-08-13 NOTE — ED Notes (Signed)
 ED registration advised ED nursing staff that pt has left while waiting in lobby after being triaged.

## 2023-08-14 ENCOUNTER — Encounter (HOSPITAL_BASED_OUTPATIENT_CLINIC_OR_DEPARTMENT_OTHER): Payer: Self-pay | Admitting: Family Medicine

## 2023-08-14 ENCOUNTER — Ambulatory Visit (HOSPITAL_BASED_OUTPATIENT_CLINIC_OR_DEPARTMENT_OTHER): Admitting: Family Medicine

## 2023-08-14 ENCOUNTER — Ambulatory Visit

## 2023-08-14 VITALS — BP 125/97 | HR 102 | Ht 66.0 in | Wt 193.6 lb

## 2023-08-14 DIAGNOSIS — I10 Essential (primary) hypertension: Secondary | ICD-10-CM | POA: Diagnosis not present

## 2023-08-14 DIAGNOSIS — R42 Dizziness and giddiness: Secondary | ICD-10-CM

## 2023-08-14 DIAGNOSIS — G935 Compression of brain: Secondary | ICD-10-CM

## 2023-08-14 DIAGNOSIS — F331 Major depressive disorder, recurrent, moderate: Secondary | ICD-10-CM

## 2023-08-14 DIAGNOSIS — F411 Generalized anxiety disorder: Secondary | ICD-10-CM

## 2023-08-14 DIAGNOSIS — F410 Panic disorder [episodic paroxysmal anxiety] without agoraphobia: Secondary | ICD-10-CM

## 2023-08-14 MED ORDER — ALPRAZOLAM 0.25 MG PO TABS: 0.2500 mg | ORAL_TABLET | Freq: Every evening | ORAL | Status: DC | PRN

## 2023-08-14 MED ORDER — AMLODIPINE BESYLATE 2.5 MG PO TABS: 2.5000 mg | ORAL_TABLET | Freq: Every day | ORAL | 3 refills | Status: DC

## 2023-08-14 NOTE — Patient Instructions (Addendum)
 Please get an eye exam at your earliest convenience!  [facility]  [address] [place], [place], [place] [address] [phone] They are open Monday-Friday until 7pm, open on weekends until 5pm Most vision insurances are accepted, can schedule online. Can receive glasses same day if needed. [email or web address]

## 2023-08-14 NOTE — Progress Notes (Signed)
 Acute Office Visit  Subjective:     Patient ID: Shannon Huerta, female    DOB: May 28, 1991, 33 y.o.   MRN: [id number]  Chief Complaint  Patient presents with   Hypertension   Loss of Consciousness    Near syncope, had some blurred vision, worked herself up and panicked. Felt like if she did not focus on something else she would pass    Shannon Huerta is a pleasant 33 year old female patient who presents today for an acute visit with her husband. She has concerns of recent repeated elevated blood pressures and feelings that she was going to pass out. Yesterday she was sitting in the car and her vision started to get blurry and started to panic. She felt as if she was going to pass out if she did not focus on something else and that's when she called her husband. She felt like she wasn't even there. Her husband made her go to the ED later that evening. Her BP was 143/90 in ED, but due to wait, she did not stay and wait to be seen. When she got home, her BP was 137/90. Taking nightly xanax  0.25mg  evening PRN- denies an increase in feelings of anxiety.   Home BPs: 130-140/90s, last five times it has been high and HR is elevated  She will check it every time she feels really bad, once or twice per week  Feels calm when taking it  Headaches are changing- pulsating pain no matter what she does  Headache gets worse with straining   She was just experiencing dizziness but now she is experiencing pain/migraine with this and is concerned.  Her husband reports that she has told him that she doesn't think she is going to live long.  It feels like an ongoing hangover Meds relieve the pain, if she even has pain, usually 6-8/10 when she has it  Becoming more forgetful, does not feel as sharp mentally  Has been following pescetarian diet the past couple of days   She has seen various specialists for these symptoms including Asthma & Allergy, [facility] rheumatology, [facility] neurology, [facility] neurology.   Neurology- [facility] thinks it is migraine Rizatriptan - repots that this does not improve headache  ENT- vestibular migraine (?)- upcoming testing 1/15 Physical therapy for vestibular therapy- therapist reports that PT may cause worsening of symptoms. She has double vision farther away, which is abnormal.   Chronic urticaria- still having breakouts. Will sometimes avoid being in public due to her lips or eyes being swollen.  But has not needed prednisone  recently      08/14/2023   10:16 AM 03/19/2023    9:22 AM  GAD 7 : Generalized Anxiety Score  Nervous, Anxious, on Edge 3 1  Control/stop worrying 3 1  Worry too much - different things 3 1  Trouble relaxing 3 1  Restless 3 0  Easily annoyed or irritable 2 1  Afraid - awful might happen 2 0  Total GAD 7 Score 19 5  Anxiety Difficulty Very difficult Somewhat difficult      08/14/2023   10:15 AM 03/19/2023    9:22 AM 05/04/2021   11:23 AM  PHQ9 SCORE ONLY  PHQ-9 Total Score 13 7 10    ROS: see HPI    Objective:    BP (!) 125/97 Comment: Repeat BP  Pulse (!) 102   Ht 5' 6 (1.676 m)   Wt 193 lb 9.6 oz (87.8 kg)   SpO2 100%   BMI  31.25 kg/m   Vitals:   08/14/23 0941 08/14/23 1035  BP: (!) 140/102 (!) 125/97   BP Readings from Last 3 Encounters:  08/14/23 (!) 125/97  08/13/23 (!) 142/98  07/26/23 126/86   Additional BP readings: 06/03/23 114/80  04/13/23 123/86   Physical Exam Vitals reviewed.  Constitutional:      Appearance: Normal appearance.  Cardiovascular:     Rate and Rhythm: Regular rhythm. Tachycardia present.     Pulses: Normal pulses.     Heart sounds: Normal heart sounds.  Pulmonary:     Effort: Pulmonary effort is normal.     Breath sounds: Normal breath sounds.  Neurological:     Mental Status: She is alert.     Cranial Nerves: Cranial nerves 2-12 are intact.  Psychiatric:        Mood and Affect: Mood normal.        Behavior: Behavior normal.        Thought Content: Thought content  normal.        Judgment: Judgment normal.    Assessment & Plan:   1. Primary hypertension (Primary) Patient presents today with concerns of noticeable increase in her blood pressure readings.  Review of her chart shows that her blood pressure have been mostly normotensive. Vital signs reviewed. Tachycardic with HR 102. Reviewed recent EKG with NSR with rate of 90. Patient in no acute distress and is well-appearing. Denies chest pain, shortness of breath, lower extremity edema. Cardiovascular exam with heart regular rate and rhythm. Normal heart sounds, no murmurs present. No lower extremity edema present. Lungs clear to auscultation bilaterally.  Due to recent blood pressures being in the 140s/90-100s, frequent headaches, and borderline Chiari malformation, would be reasonable to start on low dose calcium  channel blocker. Discussed possible side effects of amlodipine . Advised patient to monitor blood pressure readings at home. Will follow-up in 2 weeks to review home blood pressure readings.   2. Generalized anxiety disorder with panic attacks GAD-7 completed with a score of 19, with an increase from previous score of 5.  Her episode from yesterday sounds similar to a panic attack.  When she experiences this if symptoms included blurry vision, feeling like she is going to lose consciousness, tachycardia/elevated blood pressure.  Patient has been taking alprazolam  0.25mg  PRN at bedtime.  She was prescribed as 0.25mg  alprazolam  twice daily for vertigo symptoms but she reports this did not improve symptoms.  Discussed first-line pharmacotherapy for anxiety and depression includes SSRIs. Patient reports her mother is a zombie on Zoloft and does not want to be like this. Discussed GeneSight for best medicine for patient and she is agreeable to complete today. Order placed and swab completed. Will start medications once results return.   3. Major depressive disorder, recurrent episode, moderate (HCC) PHQ9  completed with score of 13, with an increase from previous score of 7. Denies SI/HI. Reports she is stressed out and feels as if something is wrong with her and she is being told she does not have any abnormalities when she does not feel like herself. Will complete GeneSight and start medication once results return.   4. Chiari malformation type I [facility]) She has seen [facility] and [facility] for review of possible Chiari malformation type I. Review of notes appear both neurologists state that her measurements for Chiari malformation are borderline. Patient presents today with headache precipitated by cough/valsalva maneuver, ongoing dizziness, and an increase in her blood pressures. Neurologists feel this is due to vertigo  and is supposed to have testing for vestibular etiology on 08/20/2022. Due to ongoing/worsening symptoms, feel it is reasonable to place referral to [facility] per patient's request.  - Ambulatory referral to Neurology  5. Dizziness Review of office note from 11/18- patient reported at this time an increase in her dizziness and fatigue. She reports she feels like she is always hungover. MRI ordered at the time and referral placed to neurology. See #4. - Ambulatory referral to Neurology  6. Vertigo See #4 - Ambulatory referral to Neurology   Return in about 2 weeks (around 08/28/2023) for Mood f/u, HTN follow-up (virtual) .  Evalene Arts, FNP

## 2023-08-15 NOTE — Therapy (Signed)
 [facility] [address] [place], [place], [address] Phone: [phone]   Fax:  [phone]  Patient Details  Name: MONTE BRONDER MRN: [id number] Date of Birth: 1990-11-13 Referring Provider:  No ref. provider found  Encounter Date: 08/15/2023  PHYSICAL THERAPY DISCHARGE SUMMARY  Visits from Start of Care: 1  Current functional level related to goals / functional outcomes: See eval   Remaining deficits: See eval   Education / Equipment: PT POC   Patient agrees to discharge. Patient goals were  unable to be assessed due to not returning since eval . Patient is being discharged due to not returning since the last visit.  Delon DELENA Pop, PT Delon DELENA Pop, PT, DPT, CBIS  08/15/2023, 8:42 AM  [facility] [address] [place], [place], [address] Phone: [phone]   Fax:  [phone]

## 2023-08-16 ENCOUNTER — Ambulatory Visit: Admitting: Physical Therapy

## 2023-08-20 ENCOUNTER — Telehealth (INDEPENDENT_AMBULATORY_CARE_PROVIDER_SITE_OTHER): Payer: Self-pay | Admitting: Audiology

## 2023-08-20 NOTE — Progress Notes (Addendum)
 [address] [place], [place] [address] [phone]  Vestibular Evaluation    Name: Shannon Huerta     DOB:   Mar 11, 1991      MRN:   [id number]                                                                                     Service Date: 08/21/2023     Accompanied by: unaccompanied     Patient comes today after Dr. Lydia Sams, ENT sent a referral for a vestibular evaluation due to concerns with balance.   Case History:  Patient reported a sensation of dizziness that started approximately two years ago. Patient noted that nothing (or rarely) unusual happens when they roll over in bed. They described their dizziness as lightheaded, dizzy, woozy with any movement , near faint sensation, drunkenness, motion sickness. Associated symptoms include  sound sensitivity, light sensitivity, headache, nausea, tingling/numbness, changes in vision, menstrual period, turning head/body , when in a car, fatigue, ear noises, ear pressure, and drinking alcohol. . Patient reports that it happens constantly. Exacerbating factors include moving or stress.  Patient feels better when she sleeps.   Currently, they manage their dizziness with nothing, she tried rizatriptan  and ampitriptilin, which, in her opinion did not work. Today the patient denied taking any relevant medications within the past 48 hours. Additional medical history pertinent to the vestibular assessment includes migraines, Chiari Malformation type I, hypertension (mild/borderline), asthma, tingling, pass put (provider reports it was syncope), blurred vision, allergies, back issues, depression, anxiety, and confusion.  Patient's hearing was tested on 07-11-2023 and results indicated normal hearing from [phone] Hz, in both ears.  [place] Sensory Organization Performance (SOP) Test: Romberg (Conditions 1-2): WNL Tandem Romberg (Conditions 3-4): WNL CTSIB (Conditions 5-6): WNL Fukuda (Condition 7): WNL   Electrophysiology: Auditory  Brainstem Response (ABR): WNL AU Electrocochleography (ECochG): Negative AU Cervical Vestibular-Evoked Myogenic Potential (cVEMP): Present AU  Videonystagmography (VNG): Ocular Motility Tests Gaze: WNL Smooth Pursuit: WNL Saccades: WNL OPK: WNL  Spontaneous Nystagmus Sitting: None Supine: None Head Shake (Active): None  Positional Tests Head Right: WNL Head Left: WNL Body Right: DNT Body Left: DNT  Positioning Tests Head Hanging Right: WNL Head Hanging Left: WNL Dix-Hallpike/Side Lying Right: Negative for BPPV Dix-Hallpike/Side Lying Left: Negative for BPPV  Bithermal Air Calorics Unilateral Weakness: 5% (<25% WNL), right Directional Preponderance: 2% (<30% WNL), left Bilateral Weakness: 55 deg/s (>24 deg/s WNL) Fixation Index: Normal  Rotary Chair: Sinusoidal Harmonic Acceleration (SHA) Frequencies Tested: 0.01 0.04, 0.08, 0.16, 0.32, 0.64 Hz Gain: WNL Phase: WNL Symmetry: WNL  Step Velocity Test (SVT) Gain: WNL Time Constant: WNL Time Constant Asymmetry: WNL     Conclusions:   Normal vestibular assessment with no central or peripheral indicators.   1.  Normal Gans SOP Test results suggest a balance between visual, somatosensory, and vestibular inputs for dynamic postural control. 2. Normal ABR results suggest good function of the cochlear nerve in both ears. 3.Normal ECochG results in both ears do not suggest elevated labyrinthine pressure, or endolymphatic hydrops, in both ears. No significant asymmetry observed in AP latency between ears, suggesting no significant retrocochlear pathology affecting the  distal portion of the auditory branch of CN VIII. 4.Normal cVEMP results in both ears suggest intact function of the saccules and inferior vestibular nerves.  5. Normal ocular motility test results suggest no central involvement. No clinically significant nystagmus or BPPV observed during positional and positioning tests. Normal head shake test results do not  suggest an imbalance of vestibular inputs. Normal bithermal air calorics results do not suggest a unilateral weakness or bilateral weakness affecting the lateral semicircular canal and/or superior vestibular nerve in either ear.   6. Normal SHA and SVT results rule out bilateral and unilateral peripheral impairment and suggest good function of the VOR with dynamic motion in the horizontal plane.    Recommendations: 1.Follow-up appointment with ENT at [facility] for further discussion and recommendations.   Audiologist: Glenn Lange, AUD Date of Service: 08/21/23

## 2023-08-20 NOTE — Telephone Encounter (Signed)
 Reminder Call:  Date: 08/21/2023 Status: Sch  Time: 9:30 AM [address] [place] [address] Confirm w/patient

## 2023-08-21 ENCOUNTER — Ambulatory Visit (INDEPENDENT_AMBULATORY_CARE_PROVIDER_SITE_OTHER): Admitting: Audiology

## 2023-08-21 DIAGNOSIS — R42 Dizziness and giddiness: Secondary | ICD-10-CM | POA: Diagnosis not present

## 2023-08-26 ENCOUNTER — Encounter: Payer: Self-pay | Admitting: Family Medicine

## 2023-08-26 ENCOUNTER — Other Ambulatory Visit: Payer: Self-pay | Admitting: Family Medicine

## 2023-08-26 ENCOUNTER — Telehealth (INDEPENDENT_AMBULATORY_CARE_PROVIDER_SITE_OTHER): Admitting: Family Medicine

## 2023-08-26 DIAGNOSIS — R5382 Chronic fatigue, unspecified: Secondary | ICD-10-CM

## 2023-08-26 MED ORDER — DULOXETINE HCL 20 MG PO CPEP: 20.0000 mg | ORAL_CAPSULE | Freq: Every day | ORAL | 3 refills | Status: DC

## 2023-08-26 NOTE — Telephone Encounter (Signed)
Called patient on 08/26/2023 to discuss recent office visit and if she has had appointment with any specialists. Verified identification using two identifiers. Provider has audio-video capability, patient preferred audio only.   Patient's location: home Provider's location: office/clinic  Patient is able to answer questions appropriately and does not sound short of breath while speaking.  Patient reports that she had a recent appointment for vestibular evaluation. She reports prior to this visit on 08/21/2023, she experienced two days where she felt "normal." However, the vestibular testing exacerbated her symptoms. Last visit, she was referred to [facility] and has not received a call from them to schedule. Provided patient with number and advised to schedule an appointment.   She reports noticing no difference in her dizziness symptoms with starting the amlodipine 2.5mg  daily. She checked it yesterday, since she was not feeling the best, and reports that it was 137/82. Denies chest pain, shortness of breath, issues with speech, or weakness on one side of the body.   She reports she does not have migraine relief with the various medications she has been put on for these. She reports that she does not think it is a migraine but possibly related to not getting enough sleep. Patient feels more fatigued and think this could be worsening her symptoms. She slept well last  night and reports feeling decent today.   Reviewed GeneSight results with patient and patient would like to start on medication to help decrease her anxiety and manage her depression. Will start with Cymbalta 20mg  daily. Discussed potential side effects, adverse reactions, and that therapeutic effect may take, at minimal, up to 4 weeks.   She would like to try a [facility] rheumatologist. Referral placed.   Spent 20 minutes on this patient encounter, including counseling with patient and coordination of care, and documentation of encounter.

## 2023-09-11 ENCOUNTER — Other Ambulatory Visit (HOSPITAL_BASED_OUTPATIENT_CLINIC_OR_DEPARTMENT_OTHER): Payer: Self-pay | Admitting: Family Medicine

## 2023-09-11 ENCOUNTER — Encounter: Payer: Self-pay | Admitting: Family Medicine

## 2023-09-11 ENCOUNTER — Other Ambulatory Visit: Payer: Self-pay | Admitting: Family Medicine

## 2023-09-11 DIAGNOSIS — R42 Dizziness and giddiness: Secondary | ICD-10-CM

## 2023-09-11 DIAGNOSIS — F410 Panic disorder [episodic paroxysmal anxiety] without agoraphobia: Secondary | ICD-10-CM

## 2023-09-11 MED ORDER — ONDANSETRON HCL 4 MG PO TABS
4.0000 mg | ORAL_TABLET | Freq: Three times a day (TID) | ORAL | 0 refills | Status: AC | PRN
Start: 2023-09-11 — End: ?

## 2023-09-11 MED ORDER — ALPRAZOLAM 0.25 MG PO TABS: 0.2500 mg | ORAL_TABLET | Freq: Every evening | ORAL | 0 refills | Status: DC | PRN

## 2023-09-19 ENCOUNTER — Telehealth (INDEPENDENT_AMBULATORY_CARE_PROVIDER_SITE_OTHER): Payer: Self-pay | Admitting: Otolaryngology

## 2023-09-19 NOTE — Telephone Encounter (Signed)
Discussed normal audio and vestibular testing. At this point, based on available otologic info, I do not think her dizziness is related to her inner ear. As such, would rec neuro follow up. Will do as needed f/u with [place]. She was appreciative of the call and will speak with her PCP and call us back if she has any othe rquestions Read Drivers

## 2023-09-19 NOTE — Addendum Note (Signed)
Addended by: Jovita Kussmaul on: 09/19/2023 05:44 PM   Modules accepted: Level of Service

## 2023-09-24 ENCOUNTER — Encounter: Payer: Self-pay | Admitting: Family Medicine

## 2023-09-24 DIAGNOSIS — R42 Dizziness and giddiness: Secondary | ICD-10-CM

## 2023-09-24 DIAGNOSIS — F410 Panic disorder [episodic paroxysmal anxiety] without agoraphobia: Secondary | ICD-10-CM

## 2023-09-25 ENCOUNTER — Telehealth: Payer: Self-pay

## 2023-09-25 NOTE — Telephone Encounter (Signed)
Mucus from colon for a couple of years now. Had colonoscopy done and was told not to worry about it. Patient states Colon mucus has a greenish tint with a foul odor. Patient states she took some antibiotics that she had handy and it seemed to clear it up. The symptoms have returned and now having greenish vaginal mucus. Patient was advised that she will need to be seen in office for screening. Appointment has been rescheduled for 10/02/23.

## 2023-09-26 ENCOUNTER — Ambulatory Visit: Admitting: Family Medicine

## 2023-10-01 ENCOUNTER — Other Ambulatory Visit: Payer: Self-pay | Admitting: Family Medicine

## 2023-10-01 DIAGNOSIS — R42 Dizziness and giddiness: Secondary | ICD-10-CM

## 2023-10-02 ENCOUNTER — Telehealth: Payer: Self-pay

## 2023-10-02 ENCOUNTER — Ambulatory Visit: Admitting: Family Medicine

## 2023-10-02 NOTE — Telephone Encounter (Signed)
 Spoke with patient regarding appointment scheduled for today. Patient states that someone called her and changed her appointment to [facility]. I informed patient that due to her symptoms she needs to be seen in office. Appointment has been rescheduled to 10/10/23.

## 2023-10-08 MED ORDER — ALPRAZOLAM 0.25 MG PO TABS: 0.2500 mg | ORAL_TABLET | Freq: Two times a day (BID) | ORAL | 0 refills | Status: DC | PRN

## 2023-10-10 ENCOUNTER — Other Ambulatory Visit (HOSPITAL_BASED_OUTPATIENT_CLINIC_OR_DEPARTMENT_OTHER): Payer: Self-pay | Admitting: Family Medicine

## 2023-10-10 ENCOUNTER — Ambulatory Visit: Admitting: Family Medicine

## 2023-10-15 ENCOUNTER — Ambulatory Visit: Admitting: Family Medicine

## 2023-10-18 ENCOUNTER — Ambulatory Visit: Payer: Self-pay | Admitting: Family Medicine

## 2023-10-18 ENCOUNTER — Ambulatory Visit
Admission: EM | Admit: 2023-10-18 | Discharge: 2023-10-18 | Disposition: A | Discharge status: Home / Self Care | Attending: Family Medicine | Admitting: Family Medicine

## 2023-10-18 ENCOUNTER — Other Ambulatory Visit (HOSPITAL_BASED_OUTPATIENT_CLINIC_OR_DEPARTMENT_OTHER): Payer: Self-pay | Admitting: Family Medicine

## 2023-10-18 DIAGNOSIS — L509 Urticaria, unspecified: Secondary | ICD-10-CM | POA: Diagnosis not present

## 2023-10-18 DIAGNOSIS — L508 Other urticaria: Secondary | ICD-10-CM | POA: Diagnosis not present

## 2023-10-18 DIAGNOSIS — L500 Allergic urticaria: Secondary | ICD-10-CM

## 2023-10-18 MED ORDER — PREDNISONE 20 MG PO TABS: ORAL_TABLET | ORAL | 0 refills | Status: DC

## 2023-10-18 MED ORDER — HYDROXYZINE HCL 25 MG PO TABS: 12.5000 mg | ORAL_TABLET | Freq: Three times a day (TID) | ORAL | 0 refills | Status: DC | PRN

## 2023-10-18 NOTE — ED Triage Notes (Signed)
 Pt reports she has chronic urticaria and has breakouts often, the last episode is going on x 3 days.

## 2023-10-18 NOTE — ED Provider Notes (Signed)
 [facility]  Note:  This document was prepared using Conservation officer, historic buildings and may include unintentional dictation errors.  MRN: [id number] DOB: November 28, 1990  Subjective:   Shannon Huerta is a 33 y.o. female presenting for 3-day history of recurrent urticaria over her arms.  Has longstanding history of chronic autoimmune urticaria.  Previously responded to allergy injections.  However, they stopped working and she has not followed up in about a year.  Denies eating any new foods, starting new medications, exposure to poisonous plants, new hygiene products, new cleaning products or detergents.    Current Facility-Administered Medications:    omalizumab Geoffry Paradise) injection 300 mg, 300 mg, Subcutaneous, Q14 Days, Verlee Monte, MD, 300 mg at 05/03/23 1430   omalizumab Geoffry Paradise) prefilled syringe 300 mg, 300 mg, Subcutaneous, Q14 Days, Verlee Monte, MD, 300 mg at 06/03/23 1205  Current Outpatient Medications:    albuterol (VENTOLIN HFA) 108 (90 Base) MCG/ACT inhaler, Inhale 2 puffs into the lungs every 6 (six) hours as needed for wheezing or shortness of breath., Disp: 8 g, Rfl: 2   ALPRAZolam (XANAX) 0.25 MG tablet, Take 1 tablet (0.25 mg total) by mouth 2 (two) times daily as needed for anxiety., Disp: 60 tablet, Rfl: 0   amLODipine (NORVASC) 2.5 MG tablet, Take 1 tablet (2.5 mg total) by mouth daily., Disp: 30 tablet, Rfl: 3   azelastine (ASTELIN) 0.1 % nasal spray, Place 1-2 sprays into both nostrils 2 (two) times daily as needed for rhinitis., Disp: 30 mL, Rfl: 5   cetirizine (ZYRTEC) 10 MG tablet, Take 10 mg by mouth daily as needed for rhinitis or allergies., Disp: , Rfl:    DULoxetine (CYMBALTA) 20 MG capsule, Take 1 capsule (20 mg total) by mouth daily., Disp: 30 capsule, Rfl: 3   eletriptan (RELPAX) 40 MG tablet, Take 1 tablet (40 mg total) by mouth as needed for migraine or headache. May repeat in 2 hours if headache persists or recurs., Disp: 10  tablet, Rfl: 0   EPINEPHrine 0.3 mg/0.3 mL IJ SOAJ injection, Inject 0.3 mg into the muscle as needed for anaphylaxis., Disp: 1 each, Rfl: 2   famotidine (PEPCID) 20 MG tablet, Take 1 tablet (20 mg total) by mouth 2 (two) times daily., Disp: 60 tablet, Rfl: 5   levocetirizine (XYZAL) 5 MG tablet, Take 1 tablet daily, can increase to 2 tablets twice daily as needed for hives.  This is maximum dose.  Do not take with other antihistamines such as hydroxyzine., Disp: 120 tablet, Rfl: 5   montelukast (SINGULAIR) 10 MG tablet, Take 1 tablet (10 mg total) by mouth at bedtime., Disp: 30 tablet, Rfl: 3   omalizumab (XOLAIR) 150 MG/ML prefilled syringe, Inject 300 mg into the skin every 14 (fourteen) days., Disp: 12 mL, Rfl: 3   ondansetron (ZOFRAN) 4 MG tablet, Take 1 tablet (4 mg total) by mouth every 8 (eight) hours as needed for nausea or vomiting., Disp: 20 tablet, Rfl: 0   Rimegepant Sulfate (NURTEC) 75 MG TBDP, Take 1 tablet (75 mg total) by mouth daily as needed., Disp: 8 tablet, Rfl: 6   rizatriptan (MAXALT-MLT) 10 MG disintegrating tablet, Take 1 tablet (10 mg total) by mouth as needed for migraine. May repeat in 2 hours if needed, Disp: 8 tablet, Rfl: 6   No Known Allergies  Past Medical History:  Diagnosis Date   Angio-edema    Angioedema 01/05/2020   Anxiety 2012   Asthma    Connective tissue disease (  HCC)    Recurrent urticaria 01/05/2020   Urticaria      History reviewed. No pertinent surgical history.  Family History  Problem Relation Age of Onset   Lupus Mother    Asthma Mother    Urticaria Father    Healthy Father    Lupus Maternal Uncle     Social History   Tobacco Use   Smoking status: Never    Passive exposure: Current   Smokeless tobacco: Never  Vaping Use   Vaping status: Never Used  Substance Use Topics   Alcohol use: Yes    Comment: SOCIALLY   Drug use: Never    ROS   Objective:   Vitals: BP (!) 148/98 (BP Location: Right Arm)   Pulse 84   Temp  98.8 F (37.1 C)   Resp 16   LMP 10/09/2023 (Approximate)   SpO2 99%   Physical Exam Constitutional:      General: She is not in acute distress.    Appearance: Normal appearance. She is well-developed. She is not ill-appearing, toxic-appearing or diaphoretic.  HENT:     Head: Normocephalic and atraumatic.     Nose: Nose normal.     Mouth/Throat:     Mouth: Mucous membranes are moist.  Eyes:     General: No scleral icterus.       Right eye: No discharge.        Left eye: No discharge.     Extraocular Movements: Extraocular movements intact.  Cardiovascular:     Rate and Rhythm: Normal rate.  Pulmonary:     Effort: Pulmonary effort is normal.  Skin:    General: Skin is warm and dry.     Findings: Rash (large patches of urticarial lesions spanning most of the upper extremities extending toward the neck and torso) present.  Neurological:     General: No focal deficit present.     Mental Status: She is alert and oriented to person, place, and time.  Psychiatric:        Mood and Affect: Mood normal.        Behavior: Behavior normal.     Assessment and Plan :   PDMP not reviewed this encounter.  1. Chronic autoimmune urticaria   2. Urticaria    Patient has acute on chronic urticaria.  Recommended prednisone, hydroxyzine.  Follow-up with the allergy clinic as soon as possible.  Counseled patient on potential for adverse effects with medications prescribed/recommended today, ER and return-to-clinic precautions discussed, patient verbalized understanding.    Wallis Bamberg, PA-C 10/18/23 1815

## 2023-10-18 NOTE — Telephone Encounter (Signed)
 Chief Complaint: Hives Symptoms: itching Frequency: x3 days Pertinent Negatives: Patient denies SOB, face, lip, tongue swelling Disposition: [] ED /[] Urgent Care (no appt availability in office) / [] Appointment(In office/virtual)/ []  [facility]/ [] Home Care/ [] Refused Recommended Disposition /[] [facility]/ []  Follow-up with PCP Additional Notes: Pt reports she has experienced chronic urticaria since age 33. Pt notes she has been 2 months without exacerbation but has had worsening hives for the past 3 days. Pt reports she typically calls in for prescriptions at that time. Pt requesting Methylprednisolone along with additional medications usually prescribed, pharmacy verified. Routing to provider for review.   Copied from CRM [id number]. Topic: Appointments - Appointment Scheduling >> Oct 18, 2023 11:16 AM Shannon Huerta wrote: Needs appt for hives.Marland Kitchen it came back -- worsening.. pls call her at [phone] Reason for Disposition  Localized hives  Answer Assessment - Initial Assessment Questions 1. APPEARANCE: "What does the rash look like?"      Hives   5. ONSET: "When did the hives begin?" (Hours or days ago)      3 days ago 6. ITCHING: "Does it itch?" If Yes, ask: "How bad is the itch?"    - MILD: doesn't interfere with normal activities   - MODERATE-SEVERE: interferes with work, school, sleep, or other activities      Severe 7. RECURRENT PROBLEM: "Have you had hives before?" If Yes, ask: "When was the last time?" and "What happened that time?"      Chronic urticaria since age 33, has exacerbated in the last few days  9. OTHER SYMPTOMS: "Do you have any other symptoms?" (e.Huerta., fever, tongue swelling, difficulty breathing, abdomen pain)  Protocols used: Hives-A-AH

## 2023-10-29 ENCOUNTER — Ambulatory Visit (INDEPENDENT_AMBULATORY_CARE_PROVIDER_SITE_OTHER)

## 2023-10-29 ENCOUNTER — Encounter (HOSPITAL_BASED_OUTPATIENT_CLINIC_OR_DEPARTMENT_OTHER): Payer: Self-pay

## 2023-10-29 VITALS — BP 138/84 | HR 101 | Wt 192.2 lb

## 2023-10-29 DIAGNOSIS — Z3201 Encounter for pregnancy test, result positive: Secondary | ICD-10-CM | POA: Diagnosis not present

## 2023-10-29 LAB — POCT URINE PREGNANCY: Preg Test, Ur: POSITIVE — AB

## 2023-10-29 NOTE — Progress Notes (Signed)
   NURSE VISIT- PREGNANCY CONFIRMATION   SUBJECTIVE:  Shannon Huerta is a 33 y.o. No obstetric history on file. Last menstrual cycle was 10/01/23-10/09/23. Here for pregnancy confirmation.  Home pregnancy test: Positive She reports no complaints.  She is not taking prenatal vitamins.    OBJECTIVE:  LMP 10/09/2023 (Approximate)   Appears well, in no apparent distress  No results found for this or any previous visit (from the past 24 hours).  ASSESSMENT: Positive pregnancy test, Unknown by LMP    PLAN: Prenatal vitamins:  N/A    Nausea medicines: not currently needed

## 2023-10-31 ENCOUNTER — Other Ambulatory Visit: Payer: Self-pay | Admitting: Family Medicine

## 2023-10-31 DIAGNOSIS — Z3201 Encounter for pregnancy test, result positive: Secondary | ICD-10-CM

## 2023-11-01 ENCOUNTER — Ambulatory Visit: Admitting: Family Medicine

## 2023-11-05 ENCOUNTER — Ambulatory Visit: Admitting: Family Medicine

## 2023-11-12 ENCOUNTER — Other Ambulatory Visit: Payer: Self-pay | Admitting: Family Medicine

## 2023-11-12 DIAGNOSIS — F410 Panic disorder [episodic paroxysmal anxiety] without agoraphobia: Secondary | ICD-10-CM

## 2023-11-12 DIAGNOSIS — R42 Dizziness and giddiness: Secondary | ICD-10-CM

## 2023-11-13 ENCOUNTER — Encounter: Payer: Self-pay | Admitting: Family Medicine

## 2023-11-13 ENCOUNTER — Ambulatory Visit (INDEPENDENT_AMBULATORY_CARE_PROVIDER_SITE_OTHER): Admitting: Family Medicine

## 2023-11-13 VITALS — BP 103/85 | HR 103 | Temp 98.8°F | Ht 66.0 in | Wt 197.0 lb

## 2023-11-13 DIAGNOSIS — L508 Other urticaria: Secondary | ICD-10-CM | POA: Diagnosis not present

## 2023-11-13 DIAGNOSIS — F410 Panic disorder [episodic paroxysmal anxiety] without agoraphobia: Secondary | ICD-10-CM

## 2023-11-13 DIAGNOSIS — F321 Major depressive disorder, single episode, moderate: Secondary | ICD-10-CM

## 2023-11-13 DIAGNOSIS — Z3201 Encounter for pregnancy test, result positive: Secondary | ICD-10-CM | POA: Diagnosis not present

## 2023-11-13 DIAGNOSIS — F411 Generalized anxiety disorder: Secondary | ICD-10-CM | POA: Diagnosis not present

## 2023-11-13 LAB — POCT URINE PREGNANCY: Preg Test, Ur: POSITIVE — AB

## 2023-11-13 MED ORDER — CLONAZEPAM 0.5 MG PO TABS
0.5000 mg | ORAL_TABLET | Freq: Two times a day (BID) | ORAL | 0 refills | Status: DC
Start: 2023-11-13 — End: 2023-11-19

## 2023-11-13 MED ORDER — LEVOCETIRIZINE DIHYDROCHLORIDE 5 MG PO TABS: ORAL_TABLET | ORAL | 5 refills | Status: DC

## 2023-11-13 MED ORDER — MONTELUKAST SODIUM 10 MG PO TABS: 10.0000 mg | ORAL_TABLET | Freq: Every day | ORAL | 3 refills | Status: DC

## 2023-11-13 MED ORDER — PREDNISONE 10 MG PO TABS: ORAL_TABLET | ORAL | 3 refills | Status: DC

## 2023-11-13 NOTE — Patient Instructions (Signed)

## 2023-11-13 NOTE — Progress Notes (Signed)
 Established Patient Office Visit  Subjective  Patient ID: Shannon Huerta, female    DOB: 1991-03-16  Age: 33 y.o. MRN: [id number]  Chief Complaint  Patient presents with   Pregnacy    Pt. Here to talk about Pregnancy.     Shannon Huerta is a pleasant 33 year old female patient who presents today for concerns about her anxiety and chronic urticaria.  Saw OBGYN on 3/25 for urine pregnancy test and decided she wanted to terminate the pregnancy. Since then, she has started the process to terminate the pregnancy with [facility]- beta hCG and ultrasound performed. Has an appt on Sat 4/12 to get the oral medication.   CHRONIC URTICARIA:  She reports she has been taking 10mg  prednisone in the morning and evening. She feels anything above 20mg  daily contributes to her feeling of being "drunk." This is the only medication that prevents her from itching. Reports all the OTC oral antihistamines and hydroxyzine do not help stop the itching. She is followed by Dr. Maurine Minister (Allergy [facility] Ast[facility]) but has not seen her for an appt in about 5 months ago.   ANXIETY: The anxiety has increased drastically.  She just started taking Cymbalta- tried it in the morning- made her sleepy in the morning. Took it at night and then could not fall asleep. She has tried hydroxyzine at night- does not make her tired.  She ran out of Xanax and needs a refill. Reports her hives may correlate with an increase in her anxiety.       11/13/2023   10:11 AM 08/14/2023   10:16 AM 03/19/2023    9:22 AM  GAD 7 : Generalized Anxiety Score  Nervous, Anxious, on Edge 3 3 1   Control/stop worrying 3 3 1   Worry too much - different things 3 3 1   Trouble relaxing 3 3 1   Restless 3 3 0  Easily annoyed or irritable 3 2 1   Afraid - awful might happen 2 2 0  Total GAD 7 Score 20 19 5   Anxiety Difficulty Extremely difficult Very difficult Somewhat difficult       11/13/2023   10:11 AM 08/14/2023   10:15 AM 03/19/2023    9:22 AM  PHQ9  SCORE ONLY  PHQ-9 Total Score 20 13 7     ROS: see HPI    Objective:    BP 103/85   Pulse (!) 103   Temp 98.8 F (37.1 C) (Oral)   Ht 5\' 6"  (1.676 m)   Wt 197 lb (89.4 kg)   LMP  (LMP Unknown)   SpO2 97%   BMI 31.80 kg/m  BP Readings from Last 3 Encounters:  11/13/23 103/85  10/29/23 138/84  10/18/23 (!) 148/98    Physical Exam Vitals reviewed.  Constitutional:      Appearance: Normal appearance.  Cardiovascular:     Rate and Rhythm: Regular rhythm. Tachycardia present.     Pulses: Normal pulses.     Heart sounds: Normal heart sounds.  Pulmonary:     Effort: Pulmonary effort is normal.     Breath sounds: Normal breath sounds.  Neurological:     Mental Status: She is alert.  Psychiatric:        Mood and Affect: Mood normal.        Behavior: Behavior normal.     Assessment & Plan:   1. Generalized anxiety disorder with panic attacks (Primary) GAD-7 completed with a score of 20.  Patient reports that she currently started Celexa 20 mg, after  running out of her Xanax, and has been tolerating this medication.  Reports that she has noticed both an increase in fatigue and insomnia with the medication.  However she has only taken 2 doses so far and plans to continue taking medication daily. We did have a discussion on MyChart regarding her recent positive pregnancy test and refilling her prescription of Xanax. Discussed with patient that benefits of mother's mental health are important to consider in continuing medication during pregnancy if she wanted to have the baby. Also discussed changing from Xanax to Klonopin for hopefully a longer lasting effect.  Patient became tearful during the visit at one point and she reports her stress level has drastically increased. Counseled patient about continuing with Celexa 20mg  daily and using Klonopin up to twice daily as needed. Plan to follow-up in 4-6 weeks for mood.   2. Current moderate episode of major depressive disorder without  prior episode (HCC)  PHQ-9 completed with a score of 20.  Denies SI/HI.  She is frustrated with her ongoing chronic autoimmune urticaria. Counseled patient to continue taking Celexa 20mg  daily and that we can discuss increasing dose.   3. Positive urine pregnancy test POCT urine pregnancy test performed in office today is positive. She reports having beta hCG blood work and an ultrasound completed with [facility] and plans to terminate the pregnancy.  - POCT urine pregnancy  4. Chronic autoimmune urticaria  Reviewed medication list with patient. She reports she is only taking hydroxyzine PRN, levocetirizine, prednisone 10mg  BID. She reports she has stopped taking Xolair since she does not notice an improvement in her chronic hives. Referral was placed to [facility] rheumatology but she was not accepted as a patient. She feels as if she is being passed back and forth between allergy and rheumatology without any definitive answers. Discussed with patient to schedule an appointment with Dr. Maurine Minister to communicate her concerns and develop a plan of care she would adhere to. She plans to call and schedule an appointment with Allergy & Asthma.    Return in about 6 weeks (around 12/25/2023) for Mood f/u (virtual) .    Alyson Reedy, FNP

## 2023-11-18 ENCOUNTER — Ambulatory Visit: Admitting: Family Medicine

## 2023-11-18 ENCOUNTER — Encounter: Payer: Self-pay | Admitting: Family Medicine

## 2023-11-19 ENCOUNTER — Other Ambulatory Visit: Payer: Self-pay | Admitting: Family Medicine

## 2023-11-19 DIAGNOSIS — F410 Panic disorder [episodic paroxysmal anxiety] without agoraphobia: Secondary | ICD-10-CM

## 2023-11-19 MED ORDER — CLONAZEPAM 0.5 MG PO TABS: 0.5000 mg | ORAL_TABLET | Freq: Every day | ORAL | 0 refills | Status: DC | PRN

## 2023-11-20 ENCOUNTER — Other Ambulatory Visit (HOSPITAL_BASED_OUTPATIENT_CLINIC_OR_DEPARTMENT_OTHER)

## 2023-11-20 ENCOUNTER — Telehealth (HOSPITAL_BASED_OUTPATIENT_CLINIC_OR_DEPARTMENT_OTHER): Payer: Self-pay | Admitting: *Deleted

## 2023-11-20 DIAGNOSIS — O3680X0 Pregnancy with inconclusive fetal viability, not applicable or unspecified: Secondary | ICD-10-CM

## 2023-11-20 NOTE — Telephone Encounter (Signed)
Called patient to schd an appt for medication refill per message from nurse .Shannon Huerta MJ

## 2023-11-20 NOTE — Telephone Encounter (Signed)
 Called patient and left a message to call the office back to reschedule the missed appointment  for today.

## 2023-12-09 ENCOUNTER — Other Ambulatory Visit: Payer: Self-pay | Admitting: Family Medicine

## 2023-12-09 DIAGNOSIS — F410 Panic disorder [episodic paroxysmal anxiety] without agoraphobia: Secondary | ICD-10-CM

## 2023-12-09 DIAGNOSIS — R42 Dizziness and giddiness: Secondary | ICD-10-CM

## 2023-12-12 ENCOUNTER — Telehealth: Admitting: Family Medicine

## 2023-12-12 ENCOUNTER — Encounter: Payer: Self-pay | Admitting: Family Medicine

## 2023-12-12 DIAGNOSIS — F330 Major depressive disorder, recurrent, mild: Secondary | ICD-10-CM | POA: Diagnosis not present

## 2023-12-12 DIAGNOSIS — F410 Panic disorder [episodic paroxysmal anxiety] without agoraphobia: Secondary | ICD-10-CM

## 2023-12-12 DIAGNOSIS — F411 Generalized anxiety disorder: Secondary | ICD-10-CM | POA: Diagnosis not present

## 2023-12-12 DIAGNOSIS — R42 Dizziness and giddiness: Secondary | ICD-10-CM

## 2023-12-12 DIAGNOSIS — F5101 Primary insomnia: Secondary | ICD-10-CM

## 2023-12-12 MED ORDER — ALPRAZOLAM 0.25 MG PO TABS: 0.2500 mg | ORAL_TABLET | Freq: Two times a day (BID) | ORAL | 0 refills | Status: DC | PRN

## 2023-12-12 MED ORDER — CLONAZEPAM 0.5 MG PO TABS: 0.5000 mg | ORAL_TABLET | Freq: Every day | ORAL | 0 refills | Status: DC | PRN

## 2023-12-12 NOTE — Patient Instructions (Signed)

## 2023-12-12 NOTE — Progress Notes (Signed)
 Virtual Visit via Video Note  I connected with Shannon Huerta on 12/12/23 at 9:42 AM by a video enabled telemedicine application and verified that I am speaking with the correct person using two identifiers.  Patient Location: Home Provider Location: Office/Clinic  I discussed the limitations, risks, security, and privacy concerns of performing an evaluation and management service by video and the availability of in person appointments. I also discussed with the patient that there may be a patient responsible charge related to this service. The patient expressed understanding and agreed to proceed.  Subjective: PCP: Shannon Hanson, FNP  ANXIETY: Shannon Huerta presents for the medical management of anxiety.  Current medication regimen:  Cymbalta  "took it for a while" and last Friday she stopped taking it. Did not want to take for a long time.  Xanax  taking during the day only PRN, maybe once a day  Klonopin  currently taking it at night. She reports she has not had any hives.  Counseling: no Well controlled: yes Denies SI/HI.      12/12/2023    9:13 AM 11/13/2023   10:11 AM 08/14/2023   10:16 AM 03/19/2023    9:22 AM  GAD 7 : Generalized Anxiety Score  Nervous, Anxious, on Edge 2 3 3 1   Control/stop worrying 3 3 3 1   Worry too much - different things 3 3 3 1   Trouble relaxing 3 3 3 1   Restless 2 3 3  0  Easily annoyed or irritable 2 3 2 1   Afraid - awful might happen 1 2 2  0  Total GAD 7 Score 16 20 19 5   Anxiety Difficulty Very difficult Extremely difficult Very difficult Somewhat difficult       12/12/2023    9:12 AM 11/13/2023   10:11 AM 08/14/2023   10:15 AM  PHQ9 SCORE ONLY  PHQ-9 Total Score 11 20 13      ROS: Per HPI  Current Outpatient Medications:    albuterol  (VENTOLIN  HFA) 108 (90 Base) MCG/ACT inhaler, Inhale 2 puffs into the lungs every 6 (six) hours as needed for wheezing or shortness of breath., Disp: 8 g, Rfl: 2   clonazePAM  (KLONOPIN ) 0.5 MG tablet, Take 1  tablet (0.5 mg total) by mouth daily as needed for anxiety., Disp: 30 tablet, Rfl: 0   levocetirizine (XYZAL ) 5 MG tablet, Take 1 tablet daily, can increase to 2 tablets twice daily as needed for hives.  This is maximum dose.  Do not take with other antihistamines such as hydroxyzine ., Disp: 120 tablet, Rfl: 5   montelukast  (SINGULAIR ) 10 MG tablet, Take 1 tablet (10 mg total) by mouth at bedtime., Disp: 30 tablet, Rfl: 3   ondansetron  (ZOFRAN ) 4 MG tablet, Take 1 tablet (4 mg total) by mouth every 8 (eight) hours as needed for nausea or vomiting., Disp: 20 tablet, Rfl: 0   rizatriptan  (MAXALT -MLT) 10 MG disintegrating tablet, Take 1 tablet (10 mg total) by mouth as needed for migraine. May repeat in 2 hours if needed, Disp: 8 tablet, Rfl: 6   EPINEPHrine  0.3 mg/0.3 mL IJ SOAJ injection, Inject 0.3 mg into the muscle as needed for anaphylaxis., Disp: 1 each, Rfl: 2   famotidine  (PEPCID ) 20 MG tablet, Take 1 tablet (20 mg total) by mouth 2 (two) times daily., Disp: 60 tablet, Rfl: 5   hydrOXYzine  (ATARAX ) 25 MG tablet, Take 0.5-1 tablets (12.5-25 mg total) by mouth every 8 (eight) hours as needed for itching. (Patient not taking: Reported on 12/12/2023), Disp: 30 tablet, Rfl:  0   omalizumab  (XOLAIR ) 150 MG/ML prefilled syringe, Inject 300 mg into the skin every 14 (fourteen) days. (Patient not taking: Reported on 12/12/2023), Disp: 12 mL, Rfl: 3  Current Facility-Administered Medications:    omalizumab  (XOLAIR ) injection 300 mg, 300 mg, Subcutaneous, Q14 Days, Sean Czar, MD, 300 mg at 05/03/23 1430   omalizumab  (XOLAIR ) prefilled syringe 300 mg, 300 mg, Subcutaneous, Q14 Days, Sean Czar, MD, 300 mg at 06/03/23 1205  Observations/Objective: General: Alert and oriented x 4. Speaking in clear and full sentences, no audible heavy breathing, no acute distress.  Sounds alert and appropriately interactive.  Appears well.  Face symmetric.  Extraocular movements intact.  Pupils equal and round.  No nasal  flaring or accessory muscle use visualized.  Assessment and Plan:  1. Generalized anxiety disorder with panic attacks (Primary)  GAD7 completed with score of 16. Denies issues with panic attacks, shortness of breath, difficulty breathing, palpitations, hyperventilation, and dizziness. She has been taking Xanax  PRN during the day and Klonopin  PRN nightly. She tried the Cymbalta  and did not want to be on a daily medication. She also did not notice an improvement in her mood with the medication. Discussed benefits of cognitive behavioral therapy (CBT). Patient is not interested in counseling due to lack of time. She would like a refill of Xanax  and Klonopin . Advised patient not to take medications at the same time. PDMP reviewed, no red flags. Plan to follow-up in 3 months for mood. Rx sent to pharmacy on file.  - ALPRAZolam  (XANAX ) 0.25 MG tablet; Take 1-2 tablets (0.25-0.5 mg total) by mouth 2 (two) times daily as needed for anxiety.  Dispense: 20 tablet; Refill: 0 - clonazePAM  (KLONOPIN ) 0.5 MG tablet; Take 1 tablet (0.5 mg total) by mouth daily as needed for anxiety.  Dispense: 30 tablet; Refill: 0  2. Mild episode of recurrent major depressive disorder (HCC) PHQ9 completed with score of 11. Denies active or passive suicidal ideations. Patient would like to continue current medication regimen.   3. Primary insomnia Patient would like to continue current medication regimen.   4. Dizziness Patient reports feeling of intoxication and would like to check her hemoglobin & hematocrit, along with her white blood cell count. Will update patient with results.  - CBC with Differential/Platelet   Follow Up Instructions: Return in about 3 months (around 03/13/2024) for Mood f/u.   I discussed the assessment and treatment plan with the patient. The patient was provided an opportunity to ask questions, and all were answered. The patient agreed with the plan and demonstrated an understanding of the  instructions.   The patient was advised to call back or seek an in-person evaluation if the symptoms worsen or if the condition fails to improve as anticipated.  The above assessment and management plan was discussed with the patient. The patient verbalized understanding of and has agreed to the management plan.   Shannon Hanson, FNP

## 2023-12-13 ENCOUNTER — Other Ambulatory Visit: Payer: Self-pay | Admitting: Family Medicine

## 2023-12-13 MED ORDER — FLUCONAZOLE 150 MG PO TABS: 150.0000 mg | ORAL_TABLET | Freq: Once | ORAL | 3 refills | Status: AC

## 2023-12-25 ENCOUNTER — Other Ambulatory Visit: Payer: Self-pay | Admitting: Family Medicine

## 2023-12-25 DIAGNOSIS — F410 Panic disorder [episodic paroxysmal anxiety] without agoraphobia: Secondary | ICD-10-CM

## 2024-01-06 ENCOUNTER — Other Ambulatory Visit: Payer: Self-pay | Admitting: Family Medicine

## 2024-01-06 DIAGNOSIS — K581 Irritable bowel syndrome with constipation: Secondary | ICD-10-CM

## 2024-01-06 DIAGNOSIS — K219 Gastro-esophageal reflux disease without esophagitis: Secondary | ICD-10-CM

## 2024-01-14 ENCOUNTER — Other Ambulatory Visit: Payer: Self-pay | Admitting: Family Medicine

## 2024-01-14 DIAGNOSIS — F410 Panic disorder [episodic paroxysmal anxiety] without agoraphobia: Secondary | ICD-10-CM

## 2024-01-15 ENCOUNTER — Other Ambulatory Visit: Payer: Self-pay | Admitting: Family Medicine

## 2024-01-15 DIAGNOSIS — F410 Panic disorder [episodic paroxysmal anxiety] without agoraphobia: Secondary | ICD-10-CM

## 2024-01-15 MED ORDER — ALPRAZOLAM 0.25 MG PO TABS: 0.2500 mg | ORAL_TABLET | Freq: Two times a day (BID) | ORAL | 0 refills | Status: DC | PRN

## 2024-01-15 NOTE — Progress Notes (Signed)
 PDMP reviewed, no red flags present. Rx refill sent to [facility].

## 2024-02-03 ENCOUNTER — Other Ambulatory Visit: Payer: Self-pay

## 2024-02-03 DIAGNOSIS — F410 Panic disorder [episodic paroxysmal anxiety] without agoraphobia: Secondary | ICD-10-CM

## 2024-02-03 MED ORDER — ALPRAZOLAM 0.25 MG PO TABS: 0.2500 mg | ORAL_TABLET | Freq: Two times a day (BID) | ORAL | 1 refills | Status: DC | PRN

## 2024-02-03 NOTE — Telephone Encounter (Signed)
 PDMP reviewed, no red flags present. Rx sent to pharmacy on file.

## 2024-02-28 ENCOUNTER — Encounter: Admitting: Obstetrics and Gynecology

## 2024-04-23 ENCOUNTER — Other Ambulatory Visit: Payer: Self-pay | Admitting: Family Medicine

## 2024-04-23 DIAGNOSIS — F410 Panic disorder [episodic paroxysmal anxiety] without agoraphobia: Secondary | ICD-10-CM

## 2024-04-27 ENCOUNTER — Encounter: Payer: Self-pay | Admitting: Family Medicine

## 2024-04-27 DIAGNOSIS — F410 Panic disorder [episodic paroxysmal anxiety] without agoraphobia: Secondary | ICD-10-CM

## 2024-04-27 MED ORDER — ALPRAZOLAM 0.25 MG PO TABS: 0.2500 mg | ORAL_TABLET | Freq: Two times a day (BID) | ORAL | 1 refills | Status: DC | PRN

## 2024-05-03 ENCOUNTER — Other Ambulatory Visit: Payer: Self-pay | Admitting: Internal Medicine

## 2024-05-07 ENCOUNTER — Encounter: Payer: Self-pay | Admitting: Family Medicine

## 2024-05-18 ENCOUNTER — Other Ambulatory Visit: Payer: Self-pay | Admitting: Family Medicine

## 2024-05-22 ENCOUNTER — Encounter: Payer: Self-pay | Admitting: Family Medicine

## 2024-05-23 ENCOUNTER — Encounter (HOSPITAL_BASED_OUTPATIENT_CLINIC_OR_DEPARTMENT_OTHER): Payer: Self-pay | Admitting: Emergency Medicine

## 2024-05-23 ENCOUNTER — Inpatient Hospital Stay (HOSPITAL_BASED_OUTPATIENT_CLINIC_OR_DEPARTMENT_OTHER)
Admission: EM | Admit: 2024-05-23 | Discharge: 2024-05-25 | DRG: 062 | Disposition: A | Discharge status: Home / Self Care | Attending: Neurology | Admitting: Neurology

## 2024-05-23 ENCOUNTER — Emergency Department (HOSPITAL_BASED_OUTPATIENT_CLINIC_OR_DEPARTMENT_OTHER)

## 2024-05-23 DIAGNOSIS — G8321 Monoplegia of upper limb affecting right dominant side: Secondary | ICD-10-CM | POA: Diagnosis present

## 2024-05-23 DIAGNOSIS — F419 Anxiety disorder, unspecified: Secondary | ICD-10-CM | POA: Diagnosis present

## 2024-05-23 DIAGNOSIS — I639 Cerebral infarction, unspecified: Secondary | ICD-10-CM | POA: Diagnosis present

## 2024-05-23 DIAGNOSIS — J45909 Unspecified asthma, uncomplicated: Secondary | ICD-10-CM | POA: Diagnosis present

## 2024-05-23 DIAGNOSIS — G459 Transient cerebral ischemic attack, unspecified: Secondary | ICD-10-CM | POA: Diagnosis not present

## 2024-05-23 DIAGNOSIS — R2990 Unspecified symptoms and signs involving the nervous system: Principal | ICD-10-CM | POA: Diagnosis present

## 2024-05-23 DIAGNOSIS — H5340 Unspecified visual field defects: Secondary | ICD-10-CM | POA: Diagnosis present

## 2024-05-23 DIAGNOSIS — G43109 Migraine with aura, not intractable, without status migrainosus: Secondary | ICD-10-CM | POA: Diagnosis not present

## 2024-05-23 DIAGNOSIS — I63412 Cerebral infarction due to embolism of left middle cerebral artery: Secondary | ICD-10-CM | POA: Diagnosis present

## 2024-05-23 DIAGNOSIS — E785 Hyperlipidemia, unspecified: Secondary | ICD-10-CM | POA: Diagnosis present

## 2024-05-23 DIAGNOSIS — R29700 NIHSS score 0: Secondary | ICD-10-CM | POA: Diagnosis present

## 2024-05-23 DIAGNOSIS — Q2112 Patent foramen ovale: Secondary | ICD-10-CM | POA: Diagnosis not present

## 2024-05-23 DIAGNOSIS — Z823 Family history of stroke: Secondary | ICD-10-CM | POA: Diagnosis not present

## 2024-05-23 HISTORY — DX: Cerebral infarction, unspecified: I63.9

## 2024-05-23 LAB — MRSA NEXT GEN BY PCR, NASAL: MRSA by PCR Next Gen: NOT DETECTED

## 2024-05-23 LAB — COMPREHENSIVE METABOLIC PANEL WITH GFR
ALT: 15 U/L (ref 0–44)
AST: 16 U/L (ref 15–41)
Albumin: 4.1 g/dL (ref 3.5–5.0)
Alkaline Phosphatase: 78 U/L (ref 38–126)
Anion gap: 11 (ref 5–15)
BUN: 8 mg/dL (ref 6–20)
CO2: 23 mmol/L (ref 22–32)
Calcium: 9.2 mg/dL (ref 8.9–10.3)
Chloride: 103 mmol/L (ref 98–111)
Creatinine, Ser: 0.88 mg/dL (ref 0.44–1.00)
GFR, Estimated: >60 mL/min (ref >60)
Glucose, Bld: 98 mg/dL (ref 70–99)
Potassium: 3.9 mmol/L (ref 3.5–5.1)
Sodium: 137 mmol/L (ref 135–145)
Total Bilirubin: 0.4 mg/dL (ref 0.0–1.2)
Total Protein: 7.5 g/dL (ref 6.5–8.1)

## 2024-05-23 LAB — CBC
HCT: 39.1 % (ref 36.0–46.0)
Hemoglobin: 12.9 g/dL (ref 12.0–15.0)
MCH: 24.4 pg — ABNORMAL LOW (ref 26.0–34.0)
MCHC: 33 g/dL (ref 30.0–36.0)
MCV: 73.9 fL — ABNORMAL LOW (ref 80.0–100.0)
Platelets: 269 K/uL (ref 150–400)
RBC: 5.29 MIL/uL — ABNORMAL HIGH (ref 3.87–5.11)
RDW: 16 % — ABNORMAL HIGH (ref 11.5–15.5)
WBC: 11.8 K/uL — ABNORMAL HIGH (ref 4.0–10.5)
nRBC: 0 % (ref 0.0–0.2)

## 2024-05-23 LAB — DIFFERENTIAL
Abs Immature Granulocytes: 0.05 K/uL (ref 0.00–0.07)
Basophils Absolute: 0 K/uL (ref 0.0–0.1)
Basophils Relative: 0 %
Eosinophils Absolute: 0.1 K/uL (ref 0.0–0.5)
Eosinophils Relative: 1 %
Immature Granulocytes: 0 %
Lymphocytes Relative: 16 %
Lymphs Abs: 1.8 K/uL (ref 0.7–4.0)
Monocytes Absolute: 0.5 K/uL (ref 0.1–1.0)
Monocytes Relative: 4 %
Neutro Abs: 9.4 K/uL — ABNORMAL HIGH (ref 1.7–7.7)
Neutrophils Relative %: 79 %

## 2024-05-23 LAB — APTT: aPTT: 28 s (ref 24–36)

## 2024-05-23 LAB — HEMOGLOBIN A1C
Hgb A1c MFr Bld: 5.4 % (ref 4.8–5.6)
Mean Plasma Glucose: 108.28 mg/dL

## 2024-05-23 LAB — CBG MONITORING, ED: Glucose-Capillary: 92 mg/dL (ref 70–99)

## 2024-05-23 LAB — PROTIME-INR
INR: 1 (ref 0.8–1.2)
Prothrombin Time: 13.3 s (ref 11.4–15.2)

## 2024-05-23 LAB — HIV ANTIBODY (ROUTINE TESTING W REFLEX): HIV Screen 4th Generation wRfx: NONREACTIVE

## 2024-05-23 LAB — PREGNANCY, URINE: Preg Test, Ur: NEGATIVE

## 2024-05-23 LAB — ANTITHROMBIN III: AntiThromb III Func: 111 % (ref 75–120)

## 2024-05-23 MED ORDER — PANTOPRAZOLE SODIUM 40 MG IV SOLR
40.0000 mg | Freq: Every day | INTRAVENOUS | Status: DC
  Administered 2024-05-23 – 2024-05-24 (×2): 40 mg via INTRAVENOUS
  Filled 2024-05-23 (×2): qty 10

## 2024-05-23 MED ORDER — ACETAMINOPHEN 650 MG RE SUPP: 650.0000 mg | RECTAL | Status: DC | PRN

## 2024-05-23 MED ORDER — ACETAMINOPHEN 325 MG PO TABS
650.0000 mg | ORAL_TABLET | ORAL | Status: DC | PRN
  Administered 2024-05-23: 650 mg via ORAL
  Filled 2024-05-23: qty 2

## 2024-05-23 MED ORDER — SODIUM CHLORIDE 0.9 % IV SOLN: INTRAVENOUS | Status: DC

## 2024-05-23 MED ORDER — TENECTEPLASE FOR STROKE
0.2500 mg/kg | PACK | Freq: Once | INTRAVENOUS | Status: AC
  Administered 2024-05-23: 23 mg via INTRAVENOUS

## 2024-05-23 MED ORDER — LABETALOL HCL 5 MG/ML IV SOLN: 10.0000 mg | INTRAVENOUS | Status: DC | PRN

## 2024-05-23 MED ORDER — HYDRALAZINE HCL 20 MG/ML IJ SOLN: 10.0000 mg | INTRAMUSCULAR | Status: DC | PRN

## 2024-05-23 MED ORDER — IOHEXOL 350 MG/ML SOLN
100.0000 mL | Freq: Once | INTRAVENOUS | Status: AC | PRN
  Administered 2024-05-23: 100 mL via INTRAVENOUS

## 2024-05-23 MED ORDER — ORAL CARE MOUTH RINSE
15.0000 mL | OROMUCOSAL | Status: DC | PRN
Start: 2024-05-24 — End: 2024-05-26

## 2024-05-23 MED ORDER — STROKE: EARLY STAGES OF RECOVERY BOOK
Freq: Once | Status: AC
  Filled 2024-05-23: qty 1

## 2024-05-23 MED ORDER — ACETAMINOPHEN 160 MG/5ML PO SOLN: 650.0000 mg | ORAL | Status: DC | PRN

## 2024-05-23 MED ORDER — HYDROCORTISONE 1 % EX CREA
TOPICAL_CREAM | Freq: Four times a day (QID) | CUTANEOUS | Status: DC | PRN
  Administered 2024-05-23 – 2024-05-24 (×2): 1 via TOPICAL
  Filled 2024-05-23: qty 28

## 2024-05-23 MED ORDER — CHLORHEXIDINE GLUCONATE CLOTH 2 % EX PADS
6.0000 | MEDICATED_PAD | Freq: Every day | CUTANEOUS | Status: DC
  Administered 2024-05-24 – 2024-05-25 (×2): 6 via TOPICAL

## 2024-05-23 MED ORDER — HYDROXYZINE HCL 25 MG PO TABS
25.0000 mg | ORAL_TABLET | Freq: Three times a day (TID) | ORAL | Status: DC | PRN
Start: 2024-05-23 — End: 2024-05-26
  Administered 2024-05-23 – 2024-05-25 (×6): 25 mg via ORAL
  Filled 2024-05-23 (×6): qty 1

## 2024-05-23 MED ORDER — TENECTEPLASE FOR STROKE
PACK | INTRAVENOUS | Status: AC
  Filled 2024-05-23: qty 10

## 2024-05-23 MED ORDER — CLONAZEPAM 0.5 MG PO TBDP
0.5000 mg | ORAL_TABLET | Freq: Two times a day (BID) | ORAL | Status: DC | PRN
  Administered 2024-05-23 – 2024-05-25 (×3): 0.5 mg via ORAL
  Filled 2024-05-23 (×3): qty 1

## 2024-05-23 MED ORDER — SENNOSIDES-DOCUSATE SODIUM 8.6-50 MG PO TABS: 1.0000 | ORAL_TABLET | Freq: Every evening | ORAL | Status: DC | PRN

## 2024-05-23 NOTE — ED Notes (Signed)
 EDP Time out for TNK Tim, RN Franne RN Dr Keerthan

## 2024-05-23 NOTE — H&P (Signed)
 NEUROLOGY H&P NOTE   Date of service: May 23, 2024 Patient Name: Shannon Huerta MRN:  [id number] DOB:  [birth year] Chief Complaint: R face and arm numbness and weakness  History of Present Illness  Shannon Huerta is a 33 y.o. female with hx of migraines, Chiari I, chronic hives, asthma, anxiety, who presents as transfer from [facility] ED after receiving TNK.  Patient reported waking up this morning fine. She went to change her son's bedding and then felt the entire right side of her face turn numb and tingle. She then felt the same on her right arm. Her right arm became heavy and floppy. Presented to [facility] where TNK given at 0830am after CTH negative and CTA without LVO. She reports complete resolution upon arrival to [facility].  Denies history of blood clots or miscarriages. Her mother had 2 strokes, before the age of 73yo. She only has brothers, none of which have had strokes. Does not take birth control or any other form of estrogen. Does have history of migraine. No history of known HTN, HLD, kidney/heart disease. Does not smoke.  Last known well: 05/23/24 @ 6am Modified rankin score: 0-Completely asymptomatic and back to baseline post- stroke IV Thrombolysis: Yes (TNK @ 0830am Thrombectomy: No (no LVO) ICH Score: NA  At [facility]: NIHSS components Score: Comment  1a Level of Conscious 0[x]  1[]  2[]  3[]      1b LOC Questions 0[x]  1[]  2[]       1c LOC Commands 0[x]  1[]  2[]       2 Best Gaze 0[x]  1[]  2[]       3 Visual 0[x]  1[]  2[]  3[]      4 Facial Palsy 0[x]  1[]  2[]  3[]      5a Motor Arm - left 0[x]  1[]  2[]  3[]  4[]  UN[]    5b Motor Arm - Right 0[x]  1[]  2[]  3[]  4[]  UN[]    6a Motor Leg - Left 0[x]  1[]  2[]  3[]  4[]  UN[]    6b Motor Leg - Right 0[x]  1[]  2[]  3[]  4[]  UN[]    7 Limb Ataxia 0[x]  1[]  2[]  UN[]      8 Sensory 0[x]  1[]  2[]  UN[]      9 Best Language 0[x]  1[]  2[]  3[]      10 Dysarthria 0[x]  1[]  2[]  UN[]      11 Extinct. and Inattention 0[x]  1[]  2[]       TOTAL: 0      ROS   Comprehensive ROS performed and pertinent positives documented in the HPI   Past History   Past Medical History:  Diagnosis Date   Angio-edema    Angioedema 01/05/2020   Anxiety 2012   Asthma    Connective tissue disease    Recurrent urticaria 01/05/2020   Urticaria    History reviewed. No pertinent surgical history. Family History  Problem Relation Age of Onset   Lupus Mother    Asthma Mother    Urticaria Father    Healthy Father    Lupus Maternal Uncle    Social History   Socioeconomic History   Marital status: Married    Spouse name: Not on file   Number of children: Not on file   Years of education: Not on file   Highest education level: Not on file  Occupational History   Not on file  Tobacco Use   Smoking status: Never    Passive exposure: Current   Smokeless tobacco: Never  Vaping Use   Vaping status: Never Used  Substance and Sexual Activity   Alcohol use: Yes  Comment: SOCIALLY   Drug use: Never   Sexual activity: Yes    Birth control/protection: None  Other Topics Concern   Not on file  Social History Narrative   Not on file   Social Drivers of Health   Financial Resource Strain: Not on file  Food Insecurity: No Food Insecurity (05/23/2024)   Hunger Vital Sign    Worried About Running Out of Food in the Last Year: Never true    Ran Out of Food in the Last Year: Never true  Transportation Needs: No Transportation Needs (05/23/2024)   PRAPARE - Administrator, Civil Service (Medical): No    Lack of Transportation (Non-Medical): No  Physical Activity: Not on file  Stress: Not on file  Social Connections: Not on file   No Known Allergies  Medications   Current Facility-Administered Medications:    [START ON 05/24/2024]  stroke: early stages of recovery book, , Does not apply, Once, Waddell Karna LABOR, NP   acetaminophen (TYLENOL) tablet 650 mg, 650 mg, Oral, Q4H PRN, 650 mg at 05/23/24 1757 **OR** acetaminophen (TYLENOL) 160  MG/5ML solution 650 mg, 650 mg, Per Tube, Q4H PRN **OR** acetaminophen (TYLENOL) suppository 650 mg, 650 mg, Rectal, Q4H PRN, Waddell Karna LABOR, NP   [START ON 05/24/2024] Chlorhexidine Gluconate Cloth 2 % PADS 6 each, 6 each, Topical, Q0600, Ina Scrivens M, MD   hydrALAZINE (APRESOLINE) injection 10-20 mg, 10-20 mg, Intravenous, Q4H PRN, Waddell Karna A, NP   hydrocortisone cream 1 %, , Topical, QID PRN, Jonne Rote M, MD   hydrOXYzine  (ATARAX ) tablet 25 mg, 25 mg, Oral, TID PRN, Yvett Rossel M, MD, 25 mg at 05/23/24 1800   labetalol (NORMODYNE) injection 10-20 mg, 10-20 mg, Intravenous, Q2H PRN, Waddell Karna LABOR, NP   [START ON 05/24/2024] Oral care mouth rinse, 15 mL, Mouth Rinse, PRN, Victorious Cosio M, MD   pantoprazole (PROTONIX) injection 40 mg, 40 mg, Intravenous, QHS, Wolfe, Denise A, NP   senna-docusate (Senokot-S) tablet 1 tablet, 1 tablet, Oral, QHS PRN, Waddell Karna LABOR, NP   Vitals   Vitals:   05/23/24 1700 05/23/24 1800 05/23/24 1900 05/23/24 1926  BP: 100/62 126/82 123/83 123/83  Pulse: 99 (!) 114 100   Resp: 17 18 18    Temp:      TempSrc:      SpO2: 96% 98% 97%   Weight:         Body mass index is 29.86 kg/m.  Physical Exam   Constitutional: Appears well-developed and well-nourished.  Psych: Affect appropriate to situation.  Eyes: No scleral injection.  HENT: No OP obstruction.  Head: Normocephalic.  Cardiovascular: Normal rate and regular rhythm.  Respiratory: Effort normal, non-labored breathing.  GI: Soft.  No distension. There is no tenderness.  Skin: WDI.   Neurologic Examination   Mental status: alert, oriented to person, place and time. Able to provide history. Speech: no dysarthria, word-finding difficulty, paraphasic errors. Cranial nerves: PERRL EOMI VF full Face sensation intact bilaterally. Face symmetric at rest and with activation. Hearing grossly intact. Palate elevation symmetric Tongue protrudes midline and has full range of motion. SCM's full strength  bilaterally. Motor: Normal bulk and tone. No abnormal movements RUE: shoulder abduction 5/5, biceps 5/5, triceps 5/5, wrist flexion 5/5, wrist extension 5/5, hand grip 5/5 LUE: shoulder abduction 5/5, biceps 5/5, triceps 5/5, wrist flexion 5/5, wrist extension 5/5, hand grip 5/5 RLE: hip flexion 5/5, knee flexion 5/5, knee extension 5/5, ankle dorsiflexion 5/5, plantar flexion 5/5 LLE: hip  flexion 5/5, knee flexion 5/5, knee extension 5/5, ankle dorsiflexion 5/5, plantar flexion 5/5 Sensory: Grossly intact to light touch throughout. Reflexes: DTR's 2+ throughout. Downgoing toes bilaterally. Coordination: FTN intact. HTS intact. Gait: Deferred   Labs   CBC:  Recent Labs  Lab 05/23/24 0809  WBC 11.8*  NEUTROABS 9.4*  HGB 12.9  HCT 39.1  MCV 73.9*  PLT 269   Basic Metabolic Panel:  Lab Results  Component Value Date   NA 137 05/23/2024   K 3.9 05/23/2024   CO2 23 05/23/2024   GLUCOSE 98 05/23/2024   BUN 8 05/23/2024   CREATININE 0.88 05/23/2024   CALCIUM 9.2 05/23/2024   GFRNONAA >60 05/23/2024   GFRAA >60 03/07/2020   Lipid Panel: No results found for: LDLCALC HgbA1c:  Lab Results  Component Value Date   HGBA1C 5.4 05/23/2024   Urine Drug Screen: No results found for: LABOPIA, COCAINSCRNUR, LABBENZ, AMPHETMU, THCU, LABBARB  Alcohol Level No results found for: [facility] INR  Lab Results  Component Value Date   INR 1.0 05/23/2024   APTT  Lab Results  Component Value Date   APTT 28 05/23/2024     CT Head without contrast(unable to view images at this time): IMPRESSION: 1. No acute intracranial abnormality.  CT angio Head and Neck with contrast(unable to view images at this time): IMPRESSION: 1. No large vessel occlusion, hemodynamically significant stenosis, or aneurysm in the head or neck. 2. Fetal origin of both posterior cerebral arteries and diminutive basilar artery terminating in the superior cerebellar arteries  bilaterally.    Assessment   Devinn Voshell Postma is a 33 y.o. female with hx of migraines, Chiari I, chronic hives, asthma, anxiety, who presents as transfer from [facility] ED after receiving TNK for R face and arm numbness, tingling, and weakness.   Primary Diagnosis:  Cerebral infarction due to embolism of  left middle cerebral artery.   Stroke risk factors: Denies history of blood clots or miscarriages. Her mother had 2 strokes, before the age of 30yo. She only has brothers, none of which have had strokes. Does not take birth control or any other form of estrogen. Does have history of migraine. No history of known HTN, HLD, kidney/heart disease. Does not smoke.   Plan  Cerebrovascular\Neuro  Stroke:  left MCA Etiology:  TBD Code Stroke CT head No acute abnormality. ASPECTS 10.    CTA head & neck without LVO MRI  ordered for 10/19 @ 8am 2D Echo ordered LDL ordered HgbA1c 5.4 VTE prophylaxis - SCD    Diet   Diet regular Room service appropriate? Yes; Fluid consistency: Thin   No antithrombotic prior to admission, now on s/p TNK. To be discharged on ASA 81mg . Therapy recommendations:  TBD Disposition:  TBD  Stroke Risk Factors Hypercoagulable work-up: antiphospholipid panel, protein C/S, factor 5 leiden, antithrombin III Family hx stroke (mother with 2 strokes before age 29) Migraines Triptans contraindicated. Patient will need to be educated. Has RX for rizatriptan     Cardiovascular  -Blood pressure goal: SBP < 180  Respiratory  - Room air  Hematology  - platelet 269 - H/H  12.9/39.1  ID\Immunology  -WBC 11.8 -Has chronic hives: PRN atarax , hydrocortisone cream  Renal  -Cr 0.88  Endocrine\Metabolic   GI\Nutrition  -Diet: Regular -Bowel Regimen: PRN sennakot  Fluid\Electrolytes  -IVF:  PRN -Sodium Goal: Normal  -Electrolyte Replacement: PRN  Prophylaxis\Quality\Safety:  -Venous Thrombosis: SCD (Lovenox can start after 830am 10/19) -GI: IV  pantop  Code Status: Full  Care Level: TBD Disposition:  TBD ______________________________________________________________________   Bonney Normie CHRISTELLA Sallyann, MD [facility]

## 2024-05-23 NOTE — ED Notes (Signed)
 Patient transported to CT

## 2024-05-23 NOTE — ED Notes (Addendum)
 Called [facility] to transport the patient to [facility] Emergency--Dr. Rogelia accepting

## 2024-05-23 NOTE — Plan of Care (Signed)
  Problem: Education: Goal: Knowledge of General Education information will improve Description: Including pain rating scale, medication(s)/side effects and non-pharmacologic comfort measures Outcome: Progressing   Problem: Clinical Measurements: Goal: Ability to maintain clinical measurements within normal limits will improve Outcome: Progressing   Problem: Clinical Measurements: Goal: Cardiovascular complication will be avoided Outcome: Progressing   Problem: Elimination: Goal: Will not experience complications related to bowel motility Outcome: Progressing   Problem: Elimination: Goal: Will not experience complications related to urinary retention Outcome: Progressing   Problem: Education: Goal: Knowledge of secondary prevention will improve (MUST DOCUMENT ALL) Outcome: Progressing   Problem: Education: Goal: Knowledge of disease or condition will improve Outcome: Progressing   Problem: Education: Goal: Knowledge of patient specific risk factors will improve (DELETE if not current risk factor) Outcome: Progressing   Problem: Ischemic Stroke/TIA Tissue Perfusion: Goal: Complications of ischemic stroke/TIA will be minimized Outcome: Progressing   Problem: Coping: Goal: Will verbalize positive feelings about self Outcome: Progressing   Problem: Coping: Goal: Will identify appropriate support needs Outcome: Progressing   Problem: Self-Care: Goal: Ability to communicate needs accurately will improve Outcome: Progressing

## 2024-05-23 NOTE — Progress Notes (Signed)
 PT Cancellation Note  Patient Details Name: Shannon Huerta MRN: [id number] DOB: 05-24-1991   Cancelled Treatment:    Reason Eval/Treat Not Completed: Medical issues which prohibited therapy (In progressnote, MD wants PT to wait for evals until 24 hours have passed.)   Stephane JULIANNA Bevel 05/23/2024, 2:04 PM Nthony Lefferts M,PT Acute Rehab Services [phone]

## 2024-05-23 NOTE — Progress Notes (Signed)
 Verbal order from Dr. Sallyann patient is allowed to use [facility] when needing to use restroom.

## 2024-05-23 NOTE — ED Provider Notes (Addendum)
 [place] EMERGENCY DEPARTMENT AT [facility] Provider Note   CSN: [id number] Arrival date & time: 05/23/24  9255     Patient presents with: Facial Numbness   Shannon Huerta is a 33 y.o. female.   HPI   33 year old female with medical history significant for anxiety, Chiari malformation type I, IBS, insomnia, GERD, asthma presenting to the emergency department with concern for strokelike symptoms.  The patient states that she woke up this morning and felt normal at her baseline.  She was changing her son's bedding as he had wet the bed when she developed sudden onset right-sided facial tingling and numbness as well as numbness and tingling down her right arm.  Symptoms came on suddenly at around 0600.  She then felt like her right arm was heavy and dropped and she could not lift it.  Symptoms improved somewhat but she still endorses ongoing difficulty with grip strength.  No difficulty swallowing or speaking, no vision loss, no facial droop.  She arrives GCS 15, ABC intact.  Prior to Admission medications   Medication Sig Start Date End Date Taking? Authorizing Provider  albuterol  (VENTOLIN  HFA) 108 (90 Base) MCG/ACT inhaler Inhale 2 puffs into the lungs every 6 (six) hours as needed for wheezing or shortness of breath. 04/19/22   Marinda Rocky SAILOR, MD  ALPRAZolam  (XANAX ) 0.25 MG tablet Take 1-2 tablets (0.25-0.5 mg total) by mouth 2 (two) times daily as needed for anxiety. 04/27/24   Ziglar, Susan K, MD  EPINEPHrine  0.3 mg/0.3 mL IJ SOAJ injection Inject 0.3 mg into the muscle as needed for anaphylaxis. 03/27/23   Towana Small, FNP  famotidine  (PEPCID ) 20 MG tablet TAKE 1 TABLET(20 MG) BY MOUTH TWICE DAILY 05/04/24   Marinda Rocky SAILOR, MD  hydrOXYzine  (ATARAX ) 25 MG tablet Take 0.5-1 tablets (12.5-25 mg total) by mouth every 8 (eight) hours as needed for itching. Patient not taking: Reported on 12/12/2023 10/18/23   Christopher Savannah, PA-C  levocetirizine (XYZAL ) 5 MG tablet Take 1 tablet  daily, can increase to 2 tablets twice daily as needed for hives.  This is maximum dose.  Do not take with other antihistamines such as hydroxyzine . 11/13/23   Towana Small, FNP  montelukast  (SINGULAIR ) 10 MG tablet Take 1 tablet (10 mg total) by mouth at bedtime. 11/13/23   Towana Small, FNP  omalizumab  (XOLAIR ) 150 MG/ML prefilled syringe Inject 300 mg into the skin every 14 (fourteen) days. Patient not taking: Reported on 12/12/2023 12/26/22   Marinda Rocky SAILOR, MD  ondansetron  (ZOFRAN ) 4 MG tablet Take 1 tablet (4 mg total) by mouth every 8 (eight) hours as needed for nausea or vomiting. 09/11/23   Towana Small, FNP  rizatriptan  (MAXALT -MLT) 10 MG disintegrating tablet Take 1 tablet (10 mg total) by mouth as needed for migraine. May repeat in 2 hours if needed 07/18/23   Penumalli, Eduard SAUNDERS, MD    Allergies: Patient has no known allergies.    Review of Systems  All other systems reviewed and are negative.   Updated Vital Signs BP (!) 128/92   Pulse 82   Temp 98.3 F (36.8 C)   Resp 17   Wt 83.9 kg   SpO2 100%   BMI 29.86 kg/m   Physical Exam Vitals and nursing note reviewed.  Constitutional:      General: She is not in acute distress.    Appearance: She is well-developed.  HENT:     Head: Normocephalic and atraumatic.  Eyes:     Conjunctiva/sclera:  Conjunctivae normal.  Cardiovascular:     Rate and Rhythm: Normal rate and regular rhythm.     Heart sounds: No murmur heard. Pulmonary:     Effort: Pulmonary effort is normal. No respiratory distress.     Breath sounds: Normal breath sounds.  Abdominal:     Palpations: Abdomen is soft.     Tenderness: There is no abdominal tenderness.  Musculoskeletal:        General: No swelling.     Cervical back: Neck supple.  Skin:    General: Skin is warm and dry.     Capillary Refill: Capillary refill takes less than 2 seconds.  Neurological:     Mental Status: She is alert.     Comments: MENTAL STATUS EXAM:    Orientation:  Alert and oriented to person, place and time.  Memory: Cooperative, follows commands well.  Language: Speech is clear and language is normal.   CRANIAL NERVES:    CN 2 (Optic): Visual fields intact to confrontation.  CN 3,4,6 (EOM): Pupils equal and reactive to light. Full extraocular eye movement without nystagmus.  CN 5 (Trigeminal): Facial sensation is normal, no weakness of masticatory muscles.  CN 7 (Facial): No facial weakness or asymmetry.  CN 8 (Auditory): Auditory acuity grossly normal.  CN 9,10 (Glossophar): The uvula is midline, the palate elevates symmetrically.  CN 11 (spinal access): Normal sternocleidomastoid and trapezius strength.  CN 12 (Hypoglossal): The tongue is midline. No atrophy or fasciculations.SABRA   MOTOR:  Muscle Strength: 4/5RUE, 5/5LUE, 5/5RLE, 5/5LLE.   COORDINATION:   No tremor.   SENSATION:   Intact to light touch all four extremities.  GAIT: Gait not assessed   Psychiatric:        Mood and Affect: Mood normal.     (all labs ordered are listed, but only abnormal results are displayed) Labs Reviewed  CBC - Abnormal; Notable for the following components:      Result Value   WBC 11.8 (*)    RBC 5.29 (*)    MCV 73.9 (*)    MCH 24.4 (*)    RDW 16.0 (*)    All other components within normal limits  DIFFERENTIAL - Abnormal; Notable for the following components:   Neutro Abs 9.4 (*)    All other components within normal limits  PROTIME-INR  APTT  COMPREHENSIVE METABOLIC PANEL WITH GFR  URINE DRUG SCREEN  PREGNANCY, URINE  CBG MONITORING, ED    EKG: EKG Interpretation Date/Time:  Saturday May 23 2024 08:07:07 EDT Ventricular Rate:  91 PR Interval:  157 QRS Duration:  93 QT Interval:  349 QTC Calculation: 430 R Axis:   62  Text Interpretation: Sinus rhythm Confirmed by Jerrol Agent ([id number]) on 05/23/2024 8:25:48 AM  Radiology: CT ANGIO HEAD NECK W WO CM Result Date: 05/23/2024 EXAM: CTA HEAD AND NECK WITHOUT AND WITH 05/23/2024  08:47:58 AM TECHNIQUE: CTA of the head and neck was performed without and with the administration of 100 mL of iohexol  (OMNIPAQUE ) 350 MG/ML injection. Multiplanar 2D and/or 3D reformatted images are provided for review. Automated exposure control, iterative reconstruction, and/or weight based adjustment of the mA/kV was utilized to reduce the radiation dose to as low as reasonably achievable. Stenosis of the internal carotid arteries measured using NASCET criteria. COMPARISON: CT of the head dated 05/23/2024 CLINICAL HISTORY: Stroke suspected (Ped 0-17y). Triage Notes: Pt endorses RT side face numbness and RT arm weakness and numbness that started at 0630. LKW 0550. States arm dropped. Speech clear,  no facial droop. FINDINGS: CTA NECK: AORTIC ARCH AND ARCH VESSELS: No dissection or arterial injury. No significant stenosis of the brachiocephalic or subclavian arteries. CERVICAL CAROTID ARTERIES: No dissection, arterial injury, or hemodynamically significant stenosis by NASCET criteria. CERVICAL VERTEBRAL ARTERIES: No dissection, arterial injury, or significant stenosis. LUNGS AND MEDIASTINUM: Unremarkable. SOFT TISSUES: No acute abnormality. BONES: No acute abnormality. CTA HEAD: ANTERIOR CIRCULATION: No significant stenosis of the internal carotid arteries. No significant stenosis of the anterior cerebral arteries. No significant stenosis of the middle cerebral arteries. No aneurysm. POSTERIOR CIRCULATION: There is fetal origin of both posterior cerebral arteries. The basilar artery is diminutive and terminates in the superior cerebellar arteries bilaterally. No significant stenosis of the vertebral arteries. No aneurysm. OTHER: No dural venous sinus thrombosis on this non-dedicated study. IMPRESSION: 1. No large vessel occlusion, hemodynamically significant stenosis, or aneurysm in the head or neck. 2. Fetal origin of both posterior cerebral arteries and diminutive basilar artery terminating in the superior  cerebellar arteries bilaterally. Electronically signed by: Evalene Coho MD 05/23/2024 08:57 AM EDT RP Workstation: [id number]   CT HEAD CODE STROKE WO CONTRAST (LKW 0-4.5h, LVO 0-24h) Result Date: 05/23/2024 EXAM: CT HEAD WITHOUT CONTRAST 05/23/2024 08:00:35 AM TECHNIQUE: CT of the head was performed without the administration of intravenous contrast. Automated exposure control, iterative reconstruction, and/or weight based adjustment of the mA/kV was utilized to reduce the radiation dose to as low as reasonably achievable. COMPARISON: CT of the head dated 04/03/2023. CLINICAL HISTORY: Neuro deficit, acute, stroke suspected. LSN 0550 AM, S S Rt Am weakness and frt side facial numbness. FINDINGS: BRAIN AND VENTRICLES: No acute hemorrhage. No evidence of acute infarct. No hydrocephalus. No extra-axial collection. No mass effect or midline shift. [place] Stroke Program Early CT (ASPECT) score: Ganglionic (caudate, IC, lentiform nucleus, insula, M1-M3): 7 Supraganglionic (M4-M6): 3 Total: 10 These findings were discussed with Dr. Jerrol at 08:06 AM 05/23/2024. ORBITS: No acute abnormality. SINUSES: No acute abnormality. SOFT TISSUES AND SKULL: No acute soft tissue abnormality. No skull fracture. IMPRESSION: 1. No acute intracranial abnormality. Electronically signed by: Evalene Coho MD 05/23/2024 08:08 AM EDT RP Workstation: [id number]     .Critical Care  Performed by: Jerrol Agent, MD Authorized by: Jerrol Agent, MD   Critical care provider statement:    Critical care time (minutes):  30   Critical care was necessary to treat or prevent imminent or life-threatening deterioration of the following conditions:  CNS failure or compromise   Critical care was time spent personally by me on the following activities:  Development of treatment plan with patient or surrogate, discussions with consultants, evaluation of patient's response to treatment, examination of patient, ordering and review of  laboratory studies, ordering and review of radiographic studies, ordering and performing treatments and interventions, pulse oximetry, re-evaluation of patient's condition and review of old charts   Care discussed with: admitting provider      Medications Ordered in the ED  tenecteplase (TNKASE) 50 MG injection for Stroke (  Not Given 05/23/24 0933)  tenecteplase (TNKASE) injection for Stroke 23 mg (23 mg Intravenous Given 05/23/24 0826)  iohexol  (OMNIPAQUE ) 350 MG/ML injection 100 mL (100 mLs Intravenous Contrast Given 05/23/24 0840)                                    Medical Decision Making Amount and/or Complexity of Data Reviewed Labs: ordered. Radiology: ordered.  Risk Decision regarding hospitalization.  33 year old female with medical history significant for anxiety, Chiari malformation type I, IBS, insomnia, GERD, asthma presenting to the emergency department with concern for strokelike symptoms.  The patient states that she woke up this morning and felt normal at her baseline.  She was changing her son's bedding as he had wet the bed when she developed sudden onset right-sided facial tingling and numbness as well as numbness and tingling down her right arm.  Symptoms came on suddenly at around 0600.  She then felt like her right arm was heavy and dropped and she could not lift it.  Symptoms improved somewhat but she still endorses ongoing difficulty with grip strength.  No difficulty swallowing or speaking, no vision loss, no facial droop.  She arrives GCS 15, ABC intact.  On arrival, the patient was not tachycardic tachypneic, hemodynamically stable, saturating well on room air.  Physical exam revealed some right upper extremity weakness 4 out of 5.  Patient within the stroke window, left normal 0 600 therefore code stroke was called on my evaluation.  Teleneurology activated.  CT imaging of the head was performed which revealed no evidence of acute bleed or acute ischemic  changes.  Following evaluation by teleneurology, the decision was made to administer TNK at 0826.  CT Head:  Neg CTA Head and Neck: No LVO  Discussed with in-call neuro, Dr. Lindzen, agreed with ICU admission for monitoring post TNK. Pt updated regarding the plan of care, notes improvement in symptoms after TNK, subsequently was admitted in critical but stablized condition.      Final diagnoses:  Stroke-like symptoms    ED Discharge Orders     None          Jerrol Agent, MD 05/23/24 [id number]    Jerrol Agent, MD 05/23/24 [phone]

## 2024-05-23 NOTE — ED Notes (Addendum)
 Notes copied from Neuro notes: Last Known Well: 05/23/2024 05:50:00 Dispatch Time: 05/23/2024 07:56:21 Arrival Time: 05/23/2024 07:44:00 Initial Response Time: 05/23/2024 07:59:49 Symptoms: Right face numbness and right arm weakness . Initial patient interaction: 05/23/2024 08:09:00 NIHSS Assessment Completed: 05/23/2024 08:12:59 Patient is a candidate for Thrombolytic. Thrombolytic Medical Decision: 05/23/2024 08:17:00 Needle Time: 05/23/2024 08:26:15 Weight Noted by Staff: 90.1 kg

## 2024-05-23 NOTE — ED Notes (Addendum)
 Code stroke called [phone]

## 2024-05-23 NOTE — ED Notes (Signed)
 Pt to CT

## 2024-05-23 NOTE — ED Notes (Signed)
 Dr Keerthan on with patient for NIH scoring @ ~ [phone]

## 2024-05-23 NOTE — ED Notes (Signed)
 TNK [id number]

## 2024-05-23 NOTE — ED Notes (Signed)
 Nose ring and bracelet placed in pts purse

## 2024-05-23 NOTE — ED Triage Notes (Signed)
 Pt endorses RT side face numbness and RT arm weakness and numbness that started at 0630. LKW 0550. States arm dropped. Speech clear, no facial droop

## 2024-05-23 NOTE — ED Notes (Signed)
She leaves with [facility] at this time.

## 2024-05-23 NOTE — Consult Note (Addendum)
 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 No large vessel occlusion, hemodynamically significant stenosis, or aneurysm in the head or neck. 2. 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    Impression:      33 year old woman with right arm weakness, right face numbness and right visual field cut.    CTH - no acute findings on prelim review  CTA H/N - Pending. Please notify teleneuro asap if any concerning findings on final radiology report. If any concerning findings, addendum with additional recommendations will be added and if negative then please proceed with recommendations as below.    Contraindications reviewed and none present as confirmed by patient. Patient confirms she is not pregnant nor has a chance of being pregnant at this time.    Risks and benefits of Thrombolytic were discussed and patient felt benefit outweighed risks as her symptoms are very disabling to her at this time and consented to medication.    Blood pressure was in goal range for thrombolytic (TNK) administration.    Recommend stroke work up as below:  - MRI brain without contrast  - Follow up CTH 24 hours post tPA  - If any clinical worsening, contact teleneurology stat and obtain stat [facility]    - If above studies are abnormal, please have neurology follow up for further recommendations    - Antiplatelet therapy for now: no antiplatelets 24 hours post thrombolytic. Can resume asa 81 mg daily after that.    - Patient may be at risk for worsening deficits with positional changes or drops in blood pressure  (pressure-dependent state), or if there is extension of stroke or development of cerebral edema; as such, recommend close neurological monitoring.    - Post Thrombolytics blood pressure less than 180/105 mm Hg for the first 24 hours and then slow normalization of blood pressure. Q15 minutes neuro checks while receiving tpA and then q1 neuro checks after that.    - In the setting of possible hypovolemia, initiate IV fluids to maintain adequate hydration and euvolemia (although be cautious in patients at risk of fluid overload, such as those with renal or heart failure)    - Lipid panel (and initiation/adjustment of statin if LDL > 70; goal LDL < 70)    - A1c; goal A1c < 7%    - EKG, telemetry and echocardiogram with bubble to detect PFO; if a cardioembolic source identified (e.g., atrial fibrillation, cardiac thrombus), contact neurology for further recommendations  - If, upon discharge, echocardiogram is unremarkable and telemetry reveals no arrhythmia and a cardioembolic event is still in the differential, recommend prolonged ambulatory cardiac monitoring in attempt to identify a symptomatic arrhythmia -- ideally 30 days.    - Troponin level    - PT/OT - Hold for first 24 hours and then resume after that    - Swallow study prior to eating/drinking and SLP evaluation    - Supplemented oxygen prn to maintain oxygen saturation > 94%    - In setting of fever (temperature >38C/100.56F), sources of hyperthermia should be identified and treated, and antipyretic medications should be administered to lower temperature    -  DVT prophylaxis -SCD for 24 hours post TNK and then choice per primary team    - Screen for sleep apnea (and refer patient to sleep clinic if patient snores, has daily fatigue or dozes unintentionally)    - Encourage smoking cessation if applicable    - Please contact us  with any further questions or concerns or if patient has any acute neurological changes (new symptoms,  worsening deficits)  Our recommendations are outlined below. Recommendations: IV Tenecteplase recommended.  I confirmed the following. (Patient name, DOB, MRN, Blood Pressure, dose of Thrombolytic and waste, weight completed by stretcher/scale not stated weight, have ED staff inform ED MD of thrombolytic decision) Thrombolytic bolus given Without Complication.   IV Tenecteplase Total Dose - 22.5 mg (Dose Rounding Per Facility Protocol)   Routine post Thrombolytic monitoring including neuro checks and blood pressure control during/after treatment Monitor blood pressure Check blood pressure and neuro assessment every 15 min for 2 h, then every 30 min for 6 h, and finally every hour for 16 h.  Manage Blood Pressure per post Thrombolytic protocol.        Follow designated hospital protocol for admission and post thrombolytic care       CT brain 24 hours post Thrombolytic       NPO until swallowing screen performed and passed       No antiplatelet agents or anticoagulants (including heparin for DVT prophylaxis) in first 24 hours       No Foley catheter, nasogastric tube, arterial catheter or central venous catheter for 24 hr, unless absolutely necessary       Telemetry       Bedside swallow evaluation       HOB less than 30 degrees       Euglycemia       Avoid hyperthermia, PRN acetaminophen       DVT prophylaxis       Inpatient Neurology Consultation       Stroke evaluation as per inpatient neurology recommendations  Discussed with ED physician    ------------------------------------------------------------------------------  Advanced Imaging: Advanced imaging has been ordered. Results pending.   Metrics: Last Known Well: 05/23/2024 05:50:00 Dispatch Time: 05/23/2024 07:56:21 Arrival Time: 05/23/2024 07:44:00 Initial Response Time: 05/23/2024 07:59:49 Symptoms: Right face numbness and right arm weakness . Initial patient interaction: 05/23/2024 08:09:00 NIHSS Assessment  Completed: 05/23/2024 08:12:59 Patient is a candidate for Thrombolytic. Thrombolytic Medical Decision: 05/23/2024 08:17:00 Needle Time: 05/23/2024 08:26:15 Weight Noted by Staff: 90.1 kg  CT Head: I personally reviewed all the CT images that were available to me and it showed: CTH - no acute findings on prelim review  Primary Provider Notified of Diagnostic Impression and Management Plan on: 05/23/2024 08:30:10    ------------------------------------------------------------------------------  Extended thrombolytic management related to:     More extensive discussion on risks and benefits was requested by patient or family  Thrombolytic Contraindications:  Last Known Well > 4.5 hours: No CT Head showing hemorrhage: No Ischemic stroke within 3 months: No Severe head trauma within 3 months: No Intracranial/intraspinal surgery within 3 months: No History of intracranial hemorrhage: No Symptoms and signs consistent with an SAH: No GI malignancy or GI bleed within 21 days: No Coagulopathy: Platelets <100 000 /mm3, INR >1.7, aPTT>40 s, or PT >15 s: No Treatment dose of LMWH within the previous 24 hrs: No Use of NOACs in past 48 hours: No Glycoprotein IIb/IIIa receptor inhibitors use: No Symptoms consistent with infective endocarditis: No Suspected aortic arch dissection: No Intra-axial intracranial neoplasm:  No  Thrombolytic Decision and Management Plan: Management with thrombolytic treatment was explained to the Patient as was risks and benefits and alternatives to the treatment. Patient agrees with the decision to proceed with thrombolytic treatment. . All questions were answered and the Patient expressed understanding of the treatment plan.   History of Present Illness: Patient is a 33 year old Female.  Patient was brought by private transportation with symptoms of Right face numbness and right arm weakness . 33 year old woman with history of migraines. Last known normal was  approx 550am when she woke and at 630am had right arm weakness when zipping her son's coat. She was brushing her teeth and felt right facial numbness. Also has right visual field cut. She does not have a migraine headache at this time. She does not take any blood thinners. This has never occurred with prior migraines.    Past Medical History:      Migraine Headaches Other PMH:  Prednisone  for chronic hives  Medications:  No Anticoagulant use  No Antiplatelet use Reviewed EMR for current medications  Allergies:  Reviewed  Social History: Smoking: No Alcohol Use: No Drug Use: No  Family History:  There is no family history of premature cerebrovascular disease pertinent to this consultation  ROS : 14 Points Review of Systems was performed and was negative except mentioned in HPI.  Past Surgical History: There Is No Surgical History Contributory To Today's Visit    Examination: BP(126/99), Pulse(93), Blood Glucose(92) 1A: Level of Consciousness - Alert; keenly responsive + 0 1B: Ask Month and Age - Both Questions Right + 0 1C: Blink Eyes & Squeeze Hands - Performs Both Tasks + 0 2: Test Horizontal Extraocular Movements - Normal + 0 3: Test Visual Fields - Partial Hemianopia + 1 4: Test Facial Palsy (Use Grimace if Obtunded) - Normal symmetry + 0 5A: Test Left Arm Motor Drift - No Drift for 10 Seconds + 0 5B: Test Right Arm Motor Drift - Drift, but doesn't hit bed + 1 6A: Test Left Leg Motor Drift - No Drift for 5 Seconds + 0 6B: Test Right Leg Motor Drift - No Drift for 5 Seconds + 0 7: Test Limb Ataxia (FNF/Heel-Shin) - No Ataxia + 0 8: Test Sensation - Mild-Moderate Loss: Less Sharp/More Dull + 1 9: Test Language/Aphasia - Normal; No aphasia + 0 10: Test Dysarthria - Normal + 0 11: Test Extinction/Inattention - No abnormality + 0  NIHSS Score: 3 NIHSS Free Text : 4/5 right arm on resistance testing with mild drift and weak grip strength, right facial numbness, right  homonymous upper quadrantanopia  Pre-Morbid Modified Rankin Scale: 0 Points = No symptoms at all  Spoke with : ER physician at bedside I reviewed the available imaging via Rapid and initiated discussion with the primary provider  This consult was conducted in real time using interactive audio and Immunologist. Patient was informed of the technology being used for this visit and agreed to proceed. Patient located in hospital and provider located at home/office setting.   Patient is being evaluated for possible acute neurologic impairment and high probability of imminent or life-threatening deterioration. I spent total of 45 minutes providing care to this patient, including time for face to face visit via telemedicine, review of medical records, imaging studies and discussion of findings with providers, the patient and/or family.     Dr Achilles Barry   [facility] For Inpatient follow-up with [facility] physician please call [facility] at [phone]. As  we are not an outpatient service for any post hospital discharge needs please contact the hospital for assistance.  If you have any questions for the [facility] physicians or need to reconsult for clinical or diagnostic changes please contact us  via [facility] at [phone].   Signature : Keerthana Guhan Bruington

## 2024-05-23 NOTE — ED Notes (Signed)
 Our C.N. Franne has just given the IV Tnk with the teleneurologist looking on.

## 2024-05-24 ENCOUNTER — Inpatient Hospital Stay (HOSPITAL_COMMUNITY)

## 2024-05-24 DIAGNOSIS — G459 Transient cerebral ischemic attack, unspecified: Secondary | ICD-10-CM

## 2024-05-24 DIAGNOSIS — E785 Hyperlipidemia, unspecified: Secondary | ICD-10-CM

## 2024-05-24 DIAGNOSIS — G43109 Migraine with aura, not intractable, without status migrainosus: Secondary | ICD-10-CM

## 2024-05-24 LAB — COMPREHENSIVE METABOLIC PANEL WITH GFR
ALT: 14 U/L (ref 0–44)
AST: 14 U/L — ABNORMAL LOW (ref 15–41)
Albumin: 2.9 g/dL — ABNORMAL LOW (ref 3.5–5.0)
Alkaline Phosphatase: 56 U/L (ref 38–126)
Anion gap: 12 (ref 5–15)
BUN: 11 mg/dL (ref 6–20)
CO2: 21 mmol/L — ABNORMAL LOW (ref 22–32)
Calcium: 8.2 mg/dL — ABNORMAL LOW (ref 8.9–10.3)
Chloride: 103 mmol/L (ref 98–111)
Creatinine, Ser: 1.03 mg/dL — ABNORMAL HIGH (ref 0.44–1.00)
GFR, Estimated: >60 mL/min (ref >60)
Glucose, Bld: 108 mg/dL — ABNORMAL HIGH (ref 70–99)
Potassium: 3.7 mmol/L (ref 3.5–5.1)
Sodium: 136 mmol/L (ref 135–145)
Total Bilirubin: 0.5 mg/dL (ref 0.0–1.2)
Total Protein: 6.2 g/dL — ABNORMAL LOW (ref 6.5–8.1)

## 2024-05-24 LAB — CBC
HCT: 39.5 % (ref 36.0–46.0)
Hemoglobin: 12.9 g/dL (ref 12.0–15.0)
MCH: 24.2 pg — ABNORMAL LOW (ref 26.0–34.0)
MCHC: 32.7 g/dL (ref 30.0–36.0)
MCV: 74.2 fL — ABNORMAL LOW (ref 80.0–100.0)
Platelets: 279 K/uL (ref 150–400)
RBC: 5.32 MIL/uL — ABNORMAL HIGH (ref 3.87–5.11)
RDW: 16.1 % — ABNORMAL HIGH (ref 11.5–15.5)
WBC: 13.1 K/uL — ABNORMAL HIGH (ref 4.0–10.5)
nRBC: 0 % (ref 0.0–0.2)

## 2024-05-24 LAB — LIPID PANEL
Cholesterol: 198 mg/dL (ref 0–200)
HDL: 36 mg/dL — ABNORMAL LOW (ref >40)
LDL Cholesterol: 132 mg/dL — ABNORMAL HIGH (ref 0–99)
Total CHOL/HDL Ratio: 5.5 ratio
Triglycerides: 152 mg/dL — ABNORMAL HIGH (ref <150)
VLDL: 30 mg/dL (ref 0–40)

## 2024-05-24 LAB — PROTIME-INR
INR: 1 (ref 0.8–1.2)
Prothrombin Time: 13.9 s (ref 11.4–15.2)

## 2024-05-24 LAB — APTT: aPTT: 31 s (ref 24–36)

## 2024-05-24 MED ORDER — ATORVASTATIN CALCIUM 80 MG PO TABS
80.0000 mg | ORAL_TABLET | Freq: Every day | ORAL | Status: DC
  Administered 2024-05-24: 80 mg via ORAL
  Filled 2024-05-24: qty 1

## 2024-05-24 MED ORDER — POTASSIUM CHLORIDE CRYS ER 10 MEQ PO TBCR
40.0000 meq | EXTENDED_RELEASE_TABLET | Freq: Once | ORAL | Status: AC
  Administered 2024-05-24: 40 meq via ORAL
  Filled 2024-05-24 (×2): qty 4

## 2024-05-24 MED ORDER — PREDNISONE 5 MG PO TABS
5.0000 mg | ORAL_TABLET | Freq: Once | ORAL | Status: AC
  Administered 2024-05-24: 5 mg via ORAL
  Filled 2024-05-24: qty 1

## 2024-05-24 MED ORDER — CLONAZEPAM 0.5 MG PO TABS
0.5000 mg | ORAL_TABLET | Freq: Once | ORAL | Status: AC | PRN
  Administered 2024-05-24: 0.5 mg via ORAL
  Filled 2024-05-24: qty 1

## 2024-05-24 MED ORDER — PREDNISONE 20 MG PO TABS: 10.0000 mg | ORAL_TABLET | Freq: Once | ORAL | Status: DC

## 2024-05-24 MED ORDER — ASPIRIN 81 MG PO TBEC
81.0000 mg | DELAYED_RELEASE_TABLET | Freq: Every day | ORAL | Status: DC
  Administered 2024-05-25: 81 mg via ORAL
  Filled 2024-05-24: qty 1

## 2024-05-24 NOTE — Procedures (Signed)
 Lumbar Puncture Procedure Note Date: 05/24/2024 Time: 1500 Indication:  Headache Proceduralist: D. Remi FNP Supervisor: CANDIE Jernigan MD  Consent obtained from patient    The patient was placed in the Left lateral decubitus position in a semi-fetal position. The area was cleansed and draped in usual sterile fashion. 1% lidocaine was used anesthetize the surrounding skin area. A 3.5-inch spinal needle was placed in the L3-L4 interspace.   Clear Pink cerebral spinal fluid noted -- only 3-4 drops yielded prior to cessation of flow with no improvement in flow despite cautious needle repositioning.  Opening pressure could not be obtained   Recommend LP under fluoro  Devon Shafer, DNP, FNP-BC [facility] Pager: [phone]  Attending Neurologist's note:  I was present for entirety of procedure and was assisted by NP.   Lola Jernigan MD-PhD [facility] [phone]  No charge note

## 2024-05-24 NOTE — Progress Notes (Signed)
 OT Cancellation Note  Patient Details Name: Shannon Huerta MRN: [id number] DOB: 12-02-1990   Cancelled Treatment:    Reason Eval/Treat Not Completed: OT screened, no needs identified, will sign off. Per PT, pt back to baseline and independent with no acute needs. Please re-consult if change in status.   Shannon Huerta FREDERICK, OTR/L [facility] Acute Rehabilitation Office: [phone]   Shannon Huerta 05/24/2024, 2:28 PM

## 2024-05-24 NOTE — Plan of Care (Signed)
  Problem: Health Behavior/Discharge Planning: Goal: Ability to manage health-related needs will improve Outcome: Progressing   Problem: Clinical Measurements: Goal: Will remain free from infection Outcome: Progressing Goal: Cardiovascular complication will be avoided Outcome: Progressing   Problem: Activity: Goal: Risk for activity intolerance will decrease Outcome: Progressing   Problem: Nutrition: Goal: Adequate nutrition will be maintained Outcome: Progressing   Problem: Coping: Goal: Level of anxiety will decrease Outcome: Progressing   Problem: Elimination: Goal: Will not experience complications related to urinary retention Outcome: Progressing   Problem: Education: Goal: Knowledge of disease or condition will improve 05/24/2024 1631 by Josiah Zane NOVAK, RN Outcome: Progressing 05/24/2024 1629 by Josiah Zane NOVAK, RN Outcome: Progressing Goal: Knowledge of secondary prevention will improve (MUST DOCUMENT ALL) 05/24/2024 1631 by Josiah Zane NOVAK, RN Outcome: Progressing 05/24/2024 1629 by Josiah Zane NOVAK, RN Outcome: Progressing

## 2024-05-24 NOTE — Discharge Summary (Shared)
 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5 05/24/2024 0557   HCT 41.8 06/24/2023 1101   PLT 279 05/24/2024 0557   PLT 314 06/24/2023 1101   MCV 74.2 (L) 05/24/2024 0557   MCV 77 (L) 06/24/2023 1101   MCH 24.2 (L) 05/24/2024 0557   MCHC 32.7 05/24/2024 0557   RDW 16.1 (H) 05/24/2024 0557   RDW 15.2 06/24/2023 1101   LYMPHSABS 1.8 05/23/2024 0809   LYMPHSABS 2.2 06/24/2023 1101   MONOABS 0.5 05/23/2024 0809   EOSABS 0.1 05/23/2024 0809   EOSABS 0.2 06/24/2023 1101   BASOSABS 0.0 05/23/2024 0809   BASOSABS 0.0 06/24/2023 1101   CMP    Component Value Date/Time   NA 136 05/24/2024 0557   NA 140 06/24/2023 1101   K 3.7 05/24/2024 0557   CL 103 05/24/2024 0557   CO2 21 (L) 05/24/2024 0557   GLUCOSE 108 (H) 05/24/2024 0557    BUN 11 05/24/2024 0557   BUN 10 06/24/2023 1101   CREATININE 1.03 (H) 05/24/2024 0557   CREATININE 0.86 04/13/2023 1102   CALCIUM 8.2 (L) 05/24/2024 0557   PROT 6.2 (L) 05/24/2024 0557   PROT 7.0 03/19/2023 0953   ALBUMIN 2.9 (L) 05/24/2024 0557   ALBUMIN 4.2 03/19/2023 0953   AST 14 (L) 05/24/2024 0557   AST 12 (L) 04/13/2023 1102   ALT 14 05/24/2024 0557   ALT 13 04/13/2023 1102   ALKPHOS 56 05/24/2024 0557   BILITOT 0.5 05/24/2024 0557   BILITOT 0.3 04/13/2023 1102   GFRNONAA >60 05/24/2024 0557   GFRNONAA >60 04/13/2023 1102   GFRAA >60 03/07/2020 2054   COAGS Lab Results  Component Value Date   INR 1.0 05/23/2024   Lipid Panel    Component Value Date/Time   CHOL 198 05/24/2024 0557   TRIG 152 (H) 05/24/2024 0557   HDL 36 (L) 05/24/2024 0557   CHOLHDL 5.5 05/24/2024 0557   VLDL 30 05/24/2024 0557   LDLCALC 132 (H) 05/24/2024 0557   HgbA1C  Lab Results  Component Value Date   HGBA1C 5.4 05/23/2024   Alcohol Level No results found for: [facility]   SIGNIFICANT DIAGNOSTIC STUDIES CT ANGIO HEAD NECK W WO CM Result Date: 05/23/2024 EXAM: CTA HEAD AND NECK WITHOUT AND WITH 05/23/2024 08:47:58  AM TECHNIQUE: CTA of the head and neck was performed without and with the administration of 100 mL of iohexol  (OMNIPAQUE ) 350 MG/ML injection. Multiplanar 2D and/or 3D reformatted images are provided for review. Automated exposure control, iterative reconstruction, and/or weight based adjustment of the mA/kV was utilized to reduce the radiation dose to as low as reasonably achievable. Stenosis of the internal carotid arteries measured using NASCET criteria. COMPARISON: CT of the head dated 05/23/2024 CLINICAL HISTORY: Stroke suspected (Ped 0-17y). Triage Notes: Pt endorses RT side face numbness and RT arm weakness and numbness that started at 0630. LKW 0550. States arm dropped. Speech clear, no facial droop. FINDINGS: CTA NECK: AORTIC ARCH AND ARCH VESSELS: No dissection or arterial  injury. No significant stenosis of the brachiocephalic or subclavian arteries. CERVICAL CAROTID ARTERIES: No dissection, arterial injury, or hemodynamically significant stenosis by NASCET criteria. CERVICAL VERTEBRAL ARTERIES: No dissection, arterial injury, or significant stenosis. LUNGS AND MEDIASTINUM: Unremarkable. SOFT TISSUES: No acute abnormality. BONES: No acute abnormality. CTA HEAD: ANTERIOR CIRCULATION: No significant stenosis of the internal carotid arteries. No significant stenosis of the anterior cerebral arteries. No significant stenosis of the middle cerebral arteries. No aneurysm. POSTERIOR CIRCULATION: There is fetal origin of both posterior cerebral arteries. The basilar artery is diminutive and terminates in the superior cerebellar arteries bilaterally. No significant stenosis of the vertebral arteries. No aneurysm. OTHER: No dural venous sinus thrombosis on this non-dedicated study. IMPRESSION: 1. No large vessel occlusion, hemodynamically significant stenosis, or aneurysm in the head or neck. 2. 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 Electronically signed by: Evalene Coho MD 05/23/2024 08:57 AM EDT RP Workstation: [id number]   CT HEAD CODE STROKE WO CONTRAST (LKW 0-4.5h, LVO 0-24h) Result Date: 05/23/2024 EXAM: CT HEAD WITHOUT CONTRAST 05/23/2024 08:00:35 AM TECHNIQUE: CT of the head was performed without the administration of intravenous contrast. Automated exposure control, iterative reconstruction, and/or weight based adjustment of the mA/kV was utilized to reduce the radiation dose to as low as reasonably achievable. COMPARISON: CT of the head dated 04/03/2023. CLINICAL HISTORY: Neuro deficit, acute, stroke suspected. LSN 0550 AM, S S Rt Am weakness and frt side facial numbness. FINDINGS: BRAIN AND VENTRICLES: No acute hemorrhage. No evidence of acute infarct. No hydrocephalus. No  extra-axial collection. No mass effect or midline shift. [place] Stroke Program Early CT (ASPECT) score: Ganglionic (caudate, IC, lentiform nucleus, insula, M1-M3): 7 Supraganglionic (M4-M6): 3 Total: 10 These findings were discussed with Dr. Jerrol at 08:06 AM 05/23/2024. ORBITS: No acute abnormality. SINUSES: No acute abnormality. SOFT TISSUES AND SKULL: No acute soft tissue abnormality. No skull fracture. IMPRESSION: 1. No acute intracranial abnormality. Electronically signed by: Evalene Coho MD 05/23/2024 08:08 AM EDT RP Workstation: [id number]       HISTORY OF PRESENT ILLNESS 33 y.o. patient with history of  history of  migraines, Chiari I, chronic hives, asthma, anxiety, who presents as transfer from [facility] ED after receiving TNK.  NIH on Admission 0   HOSPITAL COURSE TIA vs migraine s/p TNK Etiology:   Code Stroke CT head No acute abnormality. ASPECTS 10.    CTA head & neck No LCO, fetal PCA and diminutive basilar artery terminating in the SCA bilaterally  MRI No acute intracranial abnormality.  MRV Unremarkable MRV of the head.  2D Echo Pending  Hypercoag panel in process  LDL 132 HgbA1c 5.4 VTE prophylaxis - SCDs No antithrombotic prior to admission, now on {anticoagulants:31417} for *** weeks and  then *** alone. Therapy recommendations:  No follow up needed  Disposition:  Discharge home pending completion of work up    Hyperlipidemia LDL 132, goal < 70 Add Atorvastatin  Continue statin at discharge   Other Stroke Risk Factors Migraines Yesterdays event was different than normal Has had right sided tingling prior but never had weakness prior Does take tylenol daily  Follows with Dr. Margaret  Concerning for high pressure headaches  Other Active Problems Anxiety Klonoprin, amitriptyline , xanax  Autoimmunie uticaria  Having hives today- prednisone  5mg  once today Borderline Chiari Malformation  Follows with [facility] - no surgical intervention indicated   Outpatient follow up with ENT completed as well    DISCHARGE EXAM  PHYSICAL EXAM General:  Alert, well-nourished, well-developed patient in no acute distress Psych:  Mood and affect appropriate for situation CV: Regular rate and rhythm on monitor Respiratory:  Regular, unlabored respirations on room air GI: Abdomen soft and nontender  NEURO:  Mental Status: AA&Ox3  Speech/Language: speech is without dysarthria or aphasia.  Naming, repetition, fluency, and comprehension intact.  Cranial Nerves:  II: PERRL. Visual fields full.  III, IV, VI: EOMI. Eyelids elevate symmetrically.  V: Sensation is intact to light touch and symmetrical to face.  VII: Smile is symmetrical.  VIII: hearing intact to voice. IX, X: Palate elevates symmetrically. Phonation is normal.  KP:Dynloizm shrug 5/5. XII: tongue is midline without fasciculations. Motor: 5/5 strength to all muscle groups tested.  Tone: is normal and bulk is normal Sensation- Intact to light touch bilaterally. Extinction absent to light touch to DSS. Coordination: FTN intact bilaterally, HKS: no ataxia in BLE.No drift.  Gait- deferred  1a Level of Conscious.: 0 1b LOC Questions: 0 1c LOC Commands: 0 2 Best Gaze: 0 3 Visual: 0 4 Facial Palsy: 0 5a Motor Arm - left: 0 5b Motor Arm - Right: 0 6a Motor Leg - Left: 0 6b Motor Leg - Right: 0 7 Limb Ataxia: 0 8 Sensory: 0 9 Best Language: 0 10 Dysarthria: 0 11 Extinct. and Inatten.: 0 TOTAL: 0   Discharge Diet       Diet   Diet regular Room service appropriate? Yes; Fluid consistency: Thin   liquids  DISCHARGE PLAN Disposition: Home {anticoagulants:31417} for secondary stroke prevention for 3 {days/wks/mos/yrs:310907} then {anticoagulants:31417} alone. Ongoing stroke risk factor control by Primary Care Physician at time of discharge Follow-up PCP Towana Small, FNP in 2 weeks. Follow-up in [facility] - Dr. Margaret in 8 weeks, office to schedule  an appointment. Able to see NP in clinic.  30 minutes were spent preparing discharge.  Patient seen and examined by NP/APP with MD. MD to update note as needed.   Jorene Last, DNP, FNP-BC [facility] Pager: [phone]

## 2024-05-24 NOTE — Evaluation (Signed)
 Physical Therapy Evaluation & Discharge Patient Details Name: Shannon Huerta MRN: [id number] DOB: 06/23/1991 Today's Date: 05/24/2024  History of Present Illness  Pt is a 33 y.o. female who presented 05/23/24 with R arm weakness, R face numbness, and R visual field cut. Pt received TNK. MRI brain negative. S/p LP 10/19. PMH: angio-edema, anxiety, asthma, connective tissue disease, urticaria, migraines, Chiari I, chronic hives   Clinical Impression  Pt presents with condition above. Pt appears and reports to be back to her baseline, mobilizing independently and demonstrating intact, WFL, and symmetrical bil upper and lower extremity strength, sensation, and coordination. All eduction completed and questions answered. No further skilled PT services needed. Will sign off. Thank you for this referral.         If plan is discharge home, recommend the following:  (N/A)   Can travel by private vehicle        Equipment Recommendations None recommended by PT  Recommendations for Other Services       Functional Status Assessment Patient has not had a recent decline in their functional status     Precautions / Restrictions Precautions Precautions: Other (comment) Precaution/Restrictions Comments: watch HR Restrictions Weight Bearing Restrictions Per Provider Order: No      Mobility  Bed Mobility Overal bed mobility: Modified Independent             General bed mobility comments: HOB elevated, no assistance needed to enter/exit bed    Transfers Overall transfer level: Independent Equipment used: None               General transfer comment: No LOB or need for assistance    Ambulation/Gait Ambulation/Gait assistance: Independent Gait Distance (Feet): 200 Feet Assistive device: None Gait Pattern/deviations: WFL(Within Functional Limits) Gait velocity: WFL     General Gait Details: No drastic gait deviations noted. No overt LOB  Stairs Stairs: Yes Stairs  assistance: Independent Stair Management: No rails, Alternating pattern, Forwards Number of Stairs: 10 General stair comments: No LOB, ascends and descends with alternating step pattern  Wheelchair Mobility     Tilt Bed    Modified Rankin (Stroke Patients Only) Modified Rankin (Stroke Patients Only) Pre-Morbid Rankin Score: No symptoms Modified Rankin: No symptoms     Balance Overall balance assessment: No apparent balance deficits (not formally assessed)                                           Pertinent Vitals/Pain Pain Assessment Pain Assessment: Faces Faces Pain Scale: No hurt Pain Intervention(s): Monitored during session    Home Living Family/patient expects to be discharged to:: Private residence Living Arrangements: Spouse/significant other;Children Available Help at Discharge: Family;Available 24 hours/day Type of Home: House Home Access: Level entry     Alternate Level Stairs-Number of Steps: 15 Home Layout: Two level;Bed/bath upstairs Home Equipment: None      Prior Function Prior Level of Function : Independent/Modified Independent             Mobility Comments: No AD       Extremity/Trunk Assessment   Upper Extremity Assessment Upper Extremity Assessment: Overall WFL for tasks assessed (sensation and coordination intact bil; Grossly 5/5 MMT bil)    Lower Extremity Assessment Lower Extremity Assessment: Overall WFL for tasks assessed (sensation and coordination intact bil; Grossly 5/5 MMT bil)    Cervical / Trunk Assessment Cervical / Trunk Assessment:  Normal  Communication   Communication Communication: No apparent difficulties    Cognition Arousal: Alert Behavior During Therapy: WFL for tasks assessed/performed   PT - Cognitive impairments: No apparent impairments                         Following commands: Intact       Cueing Cueing Techniques: Verbal cues     General Comments General  comments (skin integrity, edema, etc.): HR up to 142 bpm, educated pt and on signs and symptoms of a stroke, she verbalized understanding    Exercises     Assessment/Plan    PT Assessment Patient does not need any further PT services  PT Problem List         PT Treatment Interventions      PT Goals (Current goals can be found in the Care Plan section)  Acute Rehab PT Goals Patient Stated Goal: to figure out the cause of her symptoms then go home PT Goal Formulation: All assessment and education complete, DC therapy Time For Goal Achievement: 05/25/24 Potential to Achieve Goals: Good    Frequency       Co-evaluation               AM-PAC PT 6 Clicks Mobility  Outcome Measure Help needed turning from your back to your side while in a flat bed without using bedrails?: None Help needed moving from lying on your back to sitting on the side of a flat bed without using bedrails?: None Help needed moving to and from a bed to a chair (including a wheelchair)?: None Help needed standing up from a chair using your arms (e.g., wheelchair or bedside chair)?: None Help needed to walk in hospital room?: None Help needed climbing 3-5 steps with a railing? : None 6 Click Score: 24    End of Session   Activity Tolerance: Patient tolerated treatment well Patient left: in bed;with call bell/phone within reach Nurse Communication: Mobility status PT Visit Diagnosis: Muscle weakness (generalized) (M62.81);Other symptoms and signs involving the nervous system (R29.898)    Time: 8583-8573 PT Time Calculation (min) (ACUTE ONLY): 10 min   Charges:   PT Evaluation $PT Eval Low Complexity: 1 Low   PT General Charges $$ ACUTE PT VISIT: 1 Visit         Theo Ferretti, PT, DPT Acute Rehabilitation Services  Office: [phone]   Theo CHRISTELLA Ferretti 05/24/2024, 5:03 PM

## 2024-05-24 NOTE — Progress Notes (Signed)
 STROKE TEAM PROGRESS NOTE    SIGNIFICANT HOSPITAL EVENTS 10/18- TNK given  INTERIM HISTORY/SUBJECTIVE Symptoms have fully resolved. She does follow with neurology outpatient but hasn't been seen in a long time for headaches. She  is having some swelling and hives currently- will order a one time dose of prednisone .  MRI with no acute infarct, LP under fluoro tomorrow   Of note, she reports that she has been taking prednisone  5 to 10 mg daily (typically 10 mg daily) since at least the beginning of September due to worsening urticaria, pending further evaluation by outpatient dermatology for steroid sparing treatment.  She also reports significant daily headaches, which are positional in nature, exacerbated by laying flat, associated with pulsatile tinnitus, intermittent blurry vision (although perhaps the blurry vision has been a little bit better since starting prednisone ), exacerbated by Valsalva.  Denies double vision or transient obscurations of vision.  Also notes that sometimes the headaches are worsened just by moving her head in certain ways, and notes that she does have neck pain as well.  Has been having weight gain in the setting of prednisone  use  She additionally reports she has had some slight right sided tingling/numbness in the setting of headaches before, but never to this severity that she had on presentation and never associated with weakness  OBJECTIVE  CBC    Component Value Date/Time   WBC 13.1 (H) 05/24/2024 0557   RBC 5.32 (H) 05/24/2024 0557   HGB 12.9 05/24/2024 0557   HGB 13.3 06/24/2023 1101   HCT 39.5 05/24/2024 0557   HCT 41.8 06/24/2023 1101   PLT 279 05/24/2024 0557   PLT 314 06/24/2023 1101   MCV 74.2 (L) 05/24/2024 0557   MCV 77 (L) 06/24/2023 1101   MCH 24.2 (L) 05/24/2024 0557   MCHC 32.7 05/24/2024 0557   RDW 16.1 (H) 05/24/2024 0557   RDW 15.2 06/24/2023 1101   LYMPHSABS 1.8 05/23/2024 0809   LYMPHSABS 2.2 06/24/2023 1101   MONOABS 0.5  05/23/2024 0809   EOSABS 0.1 05/23/2024 0809   EOSABS 0.2 06/24/2023 1101   BASOSABS 0.0 05/23/2024 0809   BASOSABS 0.0 06/24/2023 1101    BMET    Component Value Date/Time   NA 136 05/24/2024 0557   NA 140 06/24/2023 1101   K 3.7 05/24/2024 0557   CL 103 05/24/2024 0557   CO2 21 (L) 05/24/2024 0557   GLUCOSE 108 (H) 05/24/2024 0557   BUN 11 05/24/2024 0557   BUN 10 06/24/2023 1101   CREATININE 1.03 (H) 05/24/2024 0557   CREATININE 0.86 04/13/2023 1102   CALCIUM 8.2 (L) 05/24/2024 0557   EGFR 117 06/24/2023 1101   GFRNONAA >60 05/24/2024 0557   GFRNONAA >60 04/13/2023 1102    IMAGING past 24 hours CT ANGIO HEAD NECK W WO CM Result Date: 05/23/2024 EXAM: CTA HEAD AND NECK WITHOUT AND WITH 05/23/2024 08:47:58 AM TECHNIQUE: CTA of the head and neck was performed without and with the administration of 100 mL of iohexol  (OMNIPAQUE ) 350 MG/ML injection. Multiplanar 2D and/or 3D reformatted images are provided for review. Automated exposure control, iterative reconstruction, and/or weight based adjustment of the mA/kV was utilized to reduce the radiation dose to as low as reasonably achievable. Stenosis of the internal carotid arteries measured using NASCET criteria. COMPARISON: CT of the head dated 05/23/2024 CLINICAL HISTORY: Stroke suspected (Ped 0-17y). Triage Notes: Pt endorses RT side face numbness and RT arm weakness and numbness that started at 0630. LKW 0550. States arm dropped. Speech  clear, no facial droop. FINDINGS: CTA NECK: AORTIC ARCH AND ARCH VESSELS: No dissection or arterial injury. No significant stenosis of the brachiocephalic or subclavian arteries. CERVICAL CAROTID ARTERIES: No dissection, arterial injury, or hemodynamically significant stenosis by NASCET criteria. CERVICAL VERTEBRAL ARTERIES: No dissection, arterial injury, or significant stenosis. LUNGS AND MEDIASTINUM: Unremarkable. SOFT TISSUES: No acute abnormality. BONES: No acute abnormality. CTA HEAD: ANTERIOR  CIRCULATION: No significant stenosis of the internal carotid arteries. No significant stenosis of the anterior cerebral arteries. No significant stenosis of the middle cerebral arteries. No aneurysm. POSTERIOR CIRCULATION: There is fetal origin of both posterior cerebral arteries. The basilar artery is diminutive and terminates in the superior cerebellar arteries bilaterally. No significant stenosis of the vertebral arteries. No aneurysm. OTHER: No dural venous sinus thrombosis on this non-dedicated study. IMPRESSION: 1. No large vessel occlusion, hemodynamically significant stenosis, or aneurysm in the head or neck. 2. 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 Electronically signed by: Evalene Coho MD 05/23/2024 08:57 AM EDT RP Workstation: [id number]    Vitals:   05/24/24 0300 05/24/24 0400 05/24/24 0600 05/24/24 0700  BP: 99/65 108/74 (!) 105/58 107/68  Pulse: 98 (!) 106 (!) 103 (!) 102  Resp: 20 (!) 22 17 14   Temp:  98.7 F (37.1 C)    TempSrc:  Axillary    SpO2: 97% 98% 96% 99%  Weight:         PHYSICAL EXAM General:  Alert, well-nourished, well-developed patient in no acute distress Psych:  Mood and affect appropriate for situation CV: Regular rate and rhythm on monitor Respiratory:  Regular, unlabored respirations on room air GI: Abdomen soft and nontender Skin: Scattered hives involving the face and extremities, with some exacerbation during LP as well  NEURO:  Mental Status: AA&Ox3, patient is able to give clear and coherent history Speech/Language: speech is without dysarthria or aphasia.  Naming, repetition, fluency, and comprehension intact.  Cranial Nerves:  II: PERRL. Visual fields full.  III, IV, VI: EOMI. Eyelids elevate symmetrically.  V: Sensation is intact to light touch and symmetrical to face.  VII: Face is symmetrical resting and smiling VIII: hearing intact to  voice. IX, X: Palate elevates symmetrically. Phonation is normal.  KP:Dynloizm shrug 5/5. XII: tongue is midline without fasciculations. Motor: 5/5 strength to all muscle groups tested.  Tone: is normal and bulk is normal Sensation- Intact to light touch bilaterally. Extinction absent to light touch to DSS.   Coordination: FTN intact bilaterally, HKS: no ataxia in BLE.No drift.  Gait- steady  Most Recent NIH 0    ASSESSMENT/PLAN  Ms. AMIREE NO is a 33 y.o. female with history of  migraines, Chiari I, chronic hives, asthma, anxiety, who presents as transfer from [facility] ED after receiving TNK.  NIH on Admission 0  TIA vs complex migraine s/p TNK Etiology:   Code Stroke CT head No acute abnormality. ASPECTS 10.    CTA head & neck No LCO, fetal PCA and diminutive basilar artery terminating in the SCA bilaterally  MRI No acute intracranial abnormality.  MRV Unremarkable MRV of the head.  2D Echo Pending  Hypercoag panel in process  LDL 132 HgbA1c 5.4 VTE prophylaxis - SCDs No antithrombotic prior to admission, now on aspirin 81 mg daily to be started tomorrow Therapy recommendations:  No follow up needed  Disposition:  Discharge home pending completion of work up   Concern for IIH Multiple features of her headache  as described above are concerning for high pressure headache especially in the setting of weight gain.  Patients can have comorbid IIH and migraines with exacerbation of migraine headaches in the setting of untreated IIH and therefore this could be contributing to her presentation.  Additionally merits inpatient workup due to vision changes PT/INR, PTT obtained prior to LP, normal Bedside LP attempted more than 24 hours after TNK for safety, unsuccessful Fluoroscopy guided LP for opening pressure and basic studies ordered  History of migraines Risk/benefit of triptans, needs to be discussed with patient (historically contraindicated though more recent data  suggest this risk may be overstated) Potential component of medication overuse headache due to daily Tylenol use  Hyperlipidemia LDL 132, goal < 70 Add Atorvastatin  Continue statin at discharge   Other Active Problems Anxiety Klonoprin, amitriptyline , xanax  Autoimmunie uticaria  Having hives today- prednisone  5mg  once today  Borderline Chiari Malformation  Follows with [facility] - no surgical intervention indicated  Outpatient follow up with ENT completed as well Hospital day # 1  Patient seen and examined by NP/APP with MD. MD to update note as needed.   Jorene Last, DNP, FNP-BC [facility] Pager: [phone]  Attending Neurologist's note:  I personally saw this patient, gathering history, performing a full neurologic examination, reviewing relevant labs, personally reviewing relevant imaging including MRI brain, MRV, CTA head and neck, and formulated the assessment and plan, adding the note above for completeness and clarity to accurately reflect my thoughts  CRITICAL CARE Performed by: Lola LITTIE Jernigan   Total critical care time: 60 minutes  Critical care time was exclusive of separately billable procedures and treating other patients.  Critical care was necessary to treat or prevent imminent or life-threatening deterioration.  Critical care was time spent personally by me on the following activities: development of treatment plan with patient and/or surrogate as well as nursing, discussions with consultants, evaluation of patient's response to treatment, examination of patient, obtaining history from patient or surrogate, ordering and performing treatments and interventions, ordering and review of laboratory studies, ordering and review of radiographic studies, pulse oximetry and re-evaluation of patient's condition.   To contact Stroke Continuity provider, please refer to [email or web address]. After hours, contact General Neurology

## 2024-05-24 NOTE — Progress Notes (Signed)
 Jorene, NP gave order that patient may go to MRI off tele and without RN.

## 2024-05-25 ENCOUNTER — Inpatient Hospital Stay (HOSPITAL_COMMUNITY)

## 2024-05-25 DIAGNOSIS — R29700 NIHSS score 0: Secondary | ICD-10-CM

## 2024-05-25 DIAGNOSIS — I639 Cerebral infarction, unspecified: Secondary | ICD-10-CM

## 2024-05-25 DIAGNOSIS — Q2112 Patent foramen ovale: Secondary | ICD-10-CM

## 2024-05-25 LAB — ECHOCARDIOGRAM COMPLETE
AR max vel: 3.75 cm2
AV Area VTI: 3.82 cm2
AV Area mean vel: 3.22 cm2
AV Mean grad: 3 mmHg
AV Peak grad: 5.7 mmHg
Ao pk vel: 1.19 m/s
Area-P 1/2: 8.07 cm2
Calc EF: 59.2 %
MV M vel: 2.91 m/s
MV Peak grad: 33.9 mmHg
MV VTI: 4.59 cm2
S' Lateral: 3.2 cm
Single Plane A2C EF: 52.1 %
Single Plane A4C EF: 64.7 %
Weight: 2960 [oz_av]

## 2024-05-25 LAB — RENAL FUNCTION PANEL
Albumin: 2.9 g/dL — ABNORMAL LOW (ref 3.5–5.0)
Anion gap: 8 (ref 5–15)
BUN: 10 mg/dL (ref 6–20)
CO2: 23 mmol/L (ref 22–32)
Calcium: 8.4 mg/dL — ABNORMAL LOW (ref 8.9–10.3)
Chloride: 107 mmol/L (ref 98–111)
Creatinine, Ser: 0.84 mg/dL (ref 0.44–1.00)
GFR, Estimated: >60 mL/min (ref >60)
Glucose, Bld: 109 mg/dL — ABNORMAL HIGH (ref 70–99)
Phosphorus: 4 mg/dL (ref 2.5–4.6)
Potassium: 3.6 mmol/L (ref 3.5–5.1)
Sodium: 138 mmol/L (ref 135–145)

## 2024-05-25 MED ORDER — LORATADINE 10 MG PO TABS
10.0000 mg | ORAL_TABLET | Freq: Every day | ORAL | Status: DC
  Administered 2024-05-25: 10 mg via ORAL
  Filled 2024-05-25: qty 1

## 2024-05-25 MED ORDER — HYDROCORTISONE 1 % EX CREA: TOPICAL_CREAM | Freq: Four times a day (QID) | CUTANEOUS | 0 refills | Status: DC | PRN

## 2024-05-25 MED ORDER — FAMOTIDINE 20 MG PO TABS
20.0000 mg | ORAL_TABLET | Freq: Two times a day (BID) | ORAL | Status: DC
  Administered 2024-05-25: 20 mg via ORAL
  Filled 2024-05-25: qty 1

## 2024-05-25 MED ORDER — PREDNISONE 20 MG PO TABS
10.0000 mg | ORAL_TABLET | Freq: Every day | ORAL | Status: DC
  Administered 2024-05-25: 10 mg via ORAL
  Filled 2024-05-25: qty 1

## 2024-05-25 MED ORDER — POTASSIUM CHLORIDE CRYS ER 20 MEQ PO TBCR
40.0000 meq | EXTENDED_RELEASE_TABLET | Freq: Once | ORAL | Status: AC
  Administered 2024-05-25: 40 meq via ORAL
  Filled 2024-05-25: qty 2

## 2024-05-25 MED ORDER — ATORVASTATIN CALCIUM 80 MG PO TABS: 80.0000 mg | ORAL_TABLET | Freq: Every day | ORAL | 2 refills | Status: AC

## 2024-05-25 MED ORDER — ASPIRIN 81 MG PO TBEC: 81.0000 mg | DELAYED_RELEASE_TABLET | Freq: Every day | ORAL | 12 refills | Status: AC

## 2024-05-25 MED ORDER — MONTELUKAST SODIUM 10 MG PO TABS: 10.0000 mg | ORAL_TABLET | Freq: Every day | ORAL | Status: DC

## 2024-05-25 NOTE — Progress Notes (Signed)
 Patient discharge orders to home. Awaiting husband to come pick her up. Discharge teaching given to patient.

## 2024-05-25 NOTE — Discharge Summary (Signed)
 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   Gastroesophageal reflux disease 08/14/2022   Chronic fatigue 10/28/2020   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   Allergic rhinitis 01/05/2020   Mild intermittent asthma 01/05/2020   Chronic idiopathic constipation 10/08/2016   Insomnia, unspecified 10/08/2016     Allergies as of 05/25/2024   No Known Allergies      Medication List     STOP taking these medications    rizatriptan  10 MG disintegrating tablet Commonly known as: MAXALT -MLT       TAKE these medications    albuterol  108 (90 Base) MCG/ACT inhaler Commonly known as: VENTOLIN  HFA Inhale 2 puffs into the lungs every 6 (six) hours as needed for wheezing or shortness of breath.   ALPRAZolam  0.25 MG tablet Commonly known as: XANAX  Take 1-2 tablets (0.25-0.5 mg total) by mouth 2 (two) times daily as needed for anxiety. What changed:  how much to take when to take this   amitriptyline  25 MG tablet Commonly known as: ELAVIL  Take 25 mg by mouth at bedtime as needed for sleep.   aspirin EC 81 MG tablet Take 1 tablet (81 mg total) by mouth daily. Swallow whole. Start taking on: May 26, 2024   atorvastatin 80 MG tablet Commonly known as: LIPITOR Take 1 tablet (80 mg total) by mouth at bedtime.   clonazePAM  0.5 MG tablet Commonly known as: KLONOPIN  Take  0.5 mg by mouth 2 (two) times daily as needed for anxiety.   EPINEPHrine  0.3 mg/0.3 mL Soaj injection Commonly known as: EPI-PEN Inject 0.3 mg into the muscle as needed for anaphylaxis.   famotidine  20 MG tablet Commonly known as: PEPCID  TAKE 1 TABLET(20 MG) BY MOUTH TWICE DAILY   hydrocortisone cream 1 % Apply topically 4 (four) times daily as needed for itching.   hydrOXYzine  25 MG tablet Commonly known as: ATARAX  Take 0.5-1 tablets (12.5-25 mg total) by mouth every 8 (eight) hours as needed for itching.   levocetirizine 5 MG tablet Commonly known as: XYZAL  Take 1 tablet daily, can increase to 2 tablets twice daily as needed for hives.  This is maximum dose.  Do not take with other antihistamines such as hydroxyzine .   montelukast  10 MG tablet Commonly known as: SINGULAIR  Take 1 tablet (10 mg total) by mouth at bedtime.        LABORATORY STUDIES CBC    Component Value Date/Time   WBC 13.1 (H) 05/24/2024 0557   RBC 5.32 (H) 05/24/2024 0557   HGB 12.9 05/24/2024 0557   HGB 13.3 06/24/2023 1101   HCT 39.5 05/24/2024 0557   HCT 41.8 06/24/2023 1101   PLT 279 05/24/2024 0557   PLT 314 06/24/2023 1101   MCV 74.2 (L) 05/24/2024 0557   MCV 77 (L) 06/24/2023 1101   MCH 24.2 (L) 05/24/2024 0557   MCHC 32.7 05/24/2024 0557   RDW  16.1 (H) 05/24/2024 0557   RDW 15.2 06/24/2023 1101   LYMPHSABS 1.8 05/23/2024 0809   LYMPHSABS 2.2 06/24/2023 1101   MONOABS 0.5 05/23/2024 0809   EOSABS 0.1 05/23/2024 0809   EOSABS 0.2 06/24/2023 1101   BASOSABS 0.0 05/23/2024 0809   BASOSABS 0.0 06/24/2023 1101   CMP    Component Value Date/Time   NA 138 05/25/2024 0510   NA 140 06/24/2023 1101   K 3.6 05/25/2024 0510   CL 107 05/25/2024 0510   CO2 23 05/25/2024 0510   GLUCOSE 109 (H) 05/25/2024 0510   BUN 10 05/25/2024 0510   BUN 10 06/24/2023 1101   CREATININE 0.84 05/25/2024 0510   CREATININE 0.86 04/13/2023 1102   CALCIUM 8.4 (L) 05/25/2024 0510   PROT 6.2 (L) 05/24/2024  0557   PROT 7.0 03/19/2023 0953   ALBUMIN 2.9 (L) 05/25/2024 0510   ALBUMIN 4.2 03/19/2023 0953   AST 14 (L) 05/24/2024 0557   AST 12 (L) 04/13/2023 1102   ALT 14 05/24/2024 0557   ALT 13 04/13/2023 1102   ALKPHOS 56 05/24/2024 0557   BILITOT 0.5 05/24/2024 0557   BILITOT 0.3 04/13/2023 1102   GFRNONAA >60 05/25/2024 0510   GFRNONAA >60 04/13/2023 1102   GFRAA >60 03/07/2020 2054   COAGS Lab Results  Component Value Date   INR 1.0 05/24/2024   INR 1.0 05/23/2024   Lipid Panel    Component Value Date/Time   CHOL 198 05/24/2024 0557   TRIG 152 (H) 05/24/2024 0557   HDL 36 (L) 05/24/2024 0557   CHOLHDL 5.5 05/24/2024 0557   VLDL 30 05/24/2024 0557   LDLCALC 132 (H) 05/24/2024 0557   HgbA1C  Lab Results  Component Value Date   HGBA1C 5.4 05/23/2024   Alcohol Level No results found for: [facility]   SIGNIFICANT DIAGNOSTIC STUDIES ECHOCARDIOGRAM COMPLETE Result Date: 05/25/2024    ECHOCARDIOGRAM REPORT   Patient Name:   SAMAR VENNEMAN Date of Exam: 05/25/2024 Medical Rec #:  [id number]     Height:       66.0 in Accession #:    [id number]    Weight:       185.0 lb Date of Birth:  18-Sep-1990      BSA:          1.935 m Patient Age:    33 years      BP:           116/78 mmHg Patient Gender: F             HR:           90 bpm. Exam Location:  Inpatient Procedure: 2D Echo, Cardiac Doppler and Color Doppler (Both Spectral and Color            Flow Doppler were utilized during procedure). Indications:    Stroke I63.9  History:        Patient has no prior history of Echocardiogram examinations.                 Stroke; Signs/Symptoms:Fatigue and Dizziness/Lightheadedness.                 H/O Palpitations, Chiarimalformation type I.  Sonographer:    BERNARDA ROCKS Referring Phys: [id number] DENISE A WOLFE IMPRESSIONS  1. Left ventricular ejection fraction, by estimation, is 55 to 60%. The left ventricle has normal function. The left ventricle has no regional wall motion abnormalities. Left ventricular  diastolic parameters were normal.  2. Right ventricular systolic function  is normal. The right ventricular size is normal.  3. The mitral valve is normal in structure. Trivial mitral valve regurgitation. No evidence of mitral stenosis.  4. The aortic valve is normal in structure. Aortic valve regurgitation is not visualized. No aortic stenosis is present.  5. The inferior vena cava is normal in size with greater than 50% respiratory variability, suggesting right atrial pressure of 3 mmHg.  6. Cannot exclude a small PFO. Comparison(s): No prior Echocardiogram. Conclusion(s)/Recommendation(s): Cannot exclude ASD/PFO. Consider transesophageal echocardiogram, if clinically indicated. FINDINGS  Left Ventricle: Left ventricular ejection fraction, by estimation, is 55 to 60%. The left ventricle has normal function. The left ventricle has no regional wall motion abnormalities. The left ventricular internal cavity size was normal in size. There is  no left ventricular hypertrophy. Left ventricular diastolic parameters were normal. Right Ventricle: The right ventricular size is normal. No increase in right ventricular wall thickness. Right ventricular systolic function is normal. Left Atrium: Left atrial size was normal in size. Right Atrium: Right atrial size was normal in size. Pericardium: There is no evidence of pericardial effusion. Mitral Valve: The mitral valve is normal in structure. Trivial mitral valve regurgitation. No evidence of mitral valve stenosis. MV peak gradient, 3.2 mmHg. The mean mitral valve gradient is 2.0 mmHg. Tricuspid Valve: The tricuspid valve is normal in structure. Tricuspid valve regurgitation is trivial. No evidence of tricuspid stenosis. Aortic Valve: The aortic valve is normal in structure. Aortic valve regurgitation is not visualized. No aortic stenosis is present. Aortic valve mean gradient measures 3.0 mmHg. Aortic valve peak gradient measures 5.7 mmHg. Aortic valve area, by VTI measures  3.82 cm. Pulmonic Valve: The pulmonic valve was normal in structure. Pulmonic valve regurgitation is not visualized. No evidence of pulmonic stenosis. Aorta: The aortic root and ascending aorta are structurally normal, with no evidence of dilitation. Venous: The inferior vena cava is normal in size with greater than 50% respiratory variability, suggesting right atrial pressure of 3 mmHg. IAS/Shunts: Cannot exclude a small PFO.  LEFT VENTRICLE PLAX 2D LVIDd:         4.50 cm      Diastology LVIDs:         3.20 cm      LV e' medial:    8.92 cm/s LV PW:         0.80 cm      LV E/e' medial:  8.6 LV IVS:        0.60 cm      LV e' lateral:   13.30 cm/s LVOT diam:     2.10 cm      LV E/e' lateral: 5.8 LV SV:         86 LV SV Index:   44 LVOT Area:     3.46 cm  LV Volumes (MOD) LV vol d, MOD A2C: 115.0 ml LV vol d, MOD A4C: 118.0 ml LV vol s, MOD A2C: 55.1 ml LV vol s, MOD A4C: 41.7 ml LV SV MOD A2C:     59.9 ml LV SV MOD A4C:     118.0 ml LV SV MOD BP:      69.9 ml RIGHT VENTRICLE             IVC RV Basal diam:  3.40 cm     IVC diam: 1.90 cm RV S prime:     10.20 cm/s TAPSE (M-mode): 2.2 cm LEFT ATRIUM             Index  RIGHT ATRIUM           Index LA diam:        3.20 cm 1.65 cm/m   RA Area:     11.80 cm LA Vol (A2C):   34.3 ml 17.73 ml/m  RA Volume:   24.90 ml  12.87 ml/m LA Vol (A4C):   44.8 ml 23.16 ml/m LA Biplane Vol: 42.4 ml 21.92 ml/m  AORTIC VALVE                    PULMONIC VALVE AV Area (Vmax):    3.75 cm     PV Vmax:          0.93 m/s AV Area (Vmean):   3.22 cm     PV Peak grad:     3.4 mmHg AV Area (VTI):     3.82 cm     PR End Diast Vel: 2.46 msec AV Vmax:           119.00 cm/s AV Vmean:          83.700 cm/s AV VTI:            0.225 m AV Peak Grad:      5.7 mmHg AV Mean Grad:      3.0 mmHg LVOT Vmax:         129.00 cm/s LVOT Vmean:        77.900 cm/s LVOT VTI:          0.248 m LVOT/AV VTI ratio: 1.10  AORTA Ao Root diam: 3.20 cm Ao Asc diam:  3.20 cm MITRAL VALVE MV Area (PHT): 8.07 cm     SHUNTS MV Area VTI:   4.59 cm    Systemic VTI:  0.25 m MV Peak grad:  3.2 mmHg    Systemic Diam: 2.10 cm MV Mean grad:  2.0 mmHg MV Vmax:       0.89 m/s MV Vmean:      63.6 cm/s MV Decel Time: 94 msec MR Peak grad: 33.9 mmHg MR Vmax:      291.00 cm/s MV E velocity: 76.60 cm/s MV A velocity: 80.40 cm/s MV E/A ratio:  0.95 Emeline Calender Electronically signed by Emeline Calender Signature Date/Time: 05/25/2024/10:36:47 AM    Final    MR BRAIN WO CONTRAST Result Date: 05/24/2024 EXAM: MRI BRAIN WITHOUT CONTRAST 05/24/2024 11:10:26 AM TECHNIQUE: Multiplanar multisequence MRI of the head/brain was performed without the administration of intravenous contrast. COMPARISON: CT of the head dated 05/23/2024. CLINICAL HISTORY: Stroke, follow up. FINDINGS: BRAIN AND VENTRICLES: No acute infarct. No intracranial hemorrhage. No mass. No midline shift. No hydrocephalus. The sella is unremarkable. Normal flow voids. ORBITS: No acute abnormality. SINUSES AND MASTOIDS: No acute abnormality. BONES AND SOFT TISSUES: Normal marrow signal. No acute soft tissue abnormality. IMPRESSION: 1. No acute intracranial abnormality. Electronically signed by: Evalene Coho MD 05/24/2024 11:16 AM EDT RP Workstation: [id number]   MR MRV HEAD WO CM Result Date: 05/24/2024 EXAM: MRV BRAIN 05/24/2024 11:10:14 AM TECHNIQUE: Multiplanar multisequence MRV of the head was performed without the administration of intravenous contrast. Multiplanar 2D and 3D reformatted images are provided for review. COMPARISON: CT angiogram of the head and neck dated 05/23/2024. CLINICAL HISTORY: Headache, classic migraine. Headache, classic migraine. FINDINGS: No focal stenosis or thrombus is seen of the major dural venous sinuses. IMPRESSION: 1. Unremarkable MRV of the head. Electronically signed by: Evalene Coho MD 05/24/2024 11:14 AM EDT RP Workstation: [id number]   CT ANGIO HEAD NECK W WO CM  Result Date: 05/23/2024 EXAM: CTA HEAD AND NECK WITHOUT AND WITH  05/23/2024 08:47:58 AM TECHNIQUE: CTA of the head and neck was performed without and with the administration of 100 mL of iohexol  (OMNIPAQUE ) 350 MG/ML injection. Multiplanar 2D and/or 3D reformatted images are provided for review. Automated exposure control, iterative reconstruction, and/or weight based adjustment of the mA/kV was utilized to reduce the radiation dose to as low as reasonably achievable. Stenosis of the internal carotid arteries measured using NASCET criteria. COMPARISON: CT of the head dated 05/23/2024 CLINICAL HISTORY: Stroke suspected (Ped 0-17y). Triage Notes: Pt endorses RT side face numbness and RT arm weakness and numbness that started at 0630. LKW 0550. States arm dropped. Speech clear, no facial droop. FINDINGS: CTA NECK: AORTIC ARCH AND ARCH VESSELS: No dissection or arterial injury. No significant stenosis of the brachiocephalic or subclavian arteries. CERVICAL CAROTID ARTERIES: No dissection, arterial injury, or hemodynamically significant stenosis by NASCET criteria. CERVICAL VERTEBRAL ARTERIES: No dissection, arterial injury, or significant stenosis. LUNGS AND MEDIASTINUM: Unremarkable. SOFT TISSUES: No acute abnormality. BONES: No acute abnormality. CTA HEAD: ANTERIOR CIRCULATION: No significant stenosis of the internal carotid arteries. No significant stenosis of the anterior cerebral arteries. No significant stenosis of the middle cerebral arteries. No aneurysm. POSTERIOR CIRCULATION: There is fetal origin of both posterior cerebral arteries. The basilar artery is diminutive and terminates in the superior cerebellar arteries bilaterally. No significant stenosis of the vertebral arteries. No aneurysm. OTHER: No dural venous sinus thrombosis on this non-dedicated study. IMPRESSION: 1. No large vessel occlusion, hemodynamically significant stenosis, or aneurysm in the head or neck. 2. Fetal origin of both posterior cerebral arteries and diminutive basilar artery terminating in the  superior cerebellar arteries bilaterally. Electronically signed by: Evalene Coho MD 05/23/2024 08:57 AM EDT RP Workstation: [id number]   CT HEAD CODE STROKE WO CONTRAST (LKW 0-4.5h, LVO 0-24h) Result Date: 05/23/2024 EXAM: CT HEAD WITHOUT CONTRAST 05/23/2024 08:00:35 AM TECHNIQUE: CT of the head was performed without the administration of intravenous contrast. Automated exposure control, iterative reconstruction, and/or weight based adjustment of the mA/kV was utilized to reduce the radiation dose to as low as reasonably achievable. COMPARISON: CT of the head dated 04/03/2023. CLINICAL HISTORY: Neuro deficit, acute, stroke suspected. LSN 0550 AM, S S Rt Am weakness and frt side facial numbness. FINDINGS: BRAIN AND VENTRICLES: No acute hemorrhage. No evidence of acute infarct. No hydrocephalus. No extra-axial collection. No mass effect or midline shift. [place] Stroke Program Early CT (ASPECT) score: Ganglionic (caudate, IC, lentiform nucleus, insula, M1-M3): 7 Supraganglionic (M4-M6): 3 Total: 10 These findings were discussed with Dr. Jerrol at 08:06 AM 05/23/2024. ORBITS: No acute abnormality. SINUSES: No acute abnormality. SOFT TISSUES AND SKULL: No acute soft tissue abnormality. No skull fracture. IMPRESSION: 1. No acute intracranial abnormality. Electronically signed by: Evalene Coho MD 05/23/2024 08:08 AM EDT RP Workstation: [id number]       HISTORY OF PRESENT ILLNESS 33 y.o. patient with history of migraines, Chiari I malformation, chronic hives, asthma and anxiety who presented to [facility] ED with sudden onset right arm and facial numbness as well as right arm weakness.    HOSPITAL COURSE Patient was given TNK to treat potential stroke with resolution of symptoms.  As her symptoms developed suddenly after yawning, there was concern for possible PFO and TCD bubble study was performed.  This was positive, and patient will need to have outpatient TEE performed as well as cardiology  follow-up for possible PFO closure.  Lower extremity ultrasound was negative for DVT.  Stroke aborted by TNK Etiology:   Code Stroke CT head No acute abnormality. ASPECTS 10.    CTA head & neck No LCO, fetal PCA and diminutive basilar artery terminating in the SCA bilaterally  MRI No acute intracranial abnormality.  MRV Unremarkable MRV of the head.  2D Echo EF 55 to 60%, normal left atrial size, cannot exclude small PFO TCD bubble study positive for PFO Bilateral lower extremity ultrasound negative for DVT Hypercoag panel in process  LDL 132 HgbA1c 5.4 VTE prophylaxis - SCDs No antithrombotic prior to admission, now on aspirin 81 mg daily  Therapy recommendations:  No follow up needed  Disposition: Home   Concern for IIH Multiple features of her headache as described above are concerning for high pressure headache especially in the setting of weight gain.  Patients can have comorbid IIH and migraines with exacerbation of migraine headaches in the setting of untreated IIH and therefore this could be contributing to her presentation.  Additionally merits inpatient workup due to vision changes.  However, patient now states that headache does not worsen when she is lying flat, making IIH less of a concern.  Also, BMI is less than 30.  Will discontinue lumbar puncture under fluoroscopy.  Moreover she has been seen by neurologist Dr. Margaret who was thinking more of vestibular migraine and MRI shows low-lying cerebellar tonsils  PT/INR, PTT obtained prior to LP, normal Bedside LP attempted more than 24 hours after TNK for safety, unsuccessful   History of migraines Risk/benefit of triptans, needs to be discussed with patient (historically contraindicated though more recent data suggest this risk may be overstated) Potential component of medication overuse headache due to daily Tylenol use   Hyperlipidemia LDL 132, goal < 70 Add Atorvastatin 80 mg daily Continue statin at discharge      Other Active Problems Anxiety Klonoprin, amitriptyline , xanax  Autoimmunie uticaria  Having hives today- prednisone  5mg  once today  Borderline Chiari Malformation  Follows with [facility] - no surgical intervention indicated  Outpatient follow up with ENT completed as well  RN Pressure Injury Documentation:     DISCHARGE EXAM  PHYSICAL EXAM General:  Alert, well-nourished, well-developed patient in no acute distress Psych:  Mood and affect appropriate for situation CV: Regular rate and rhythm on monitor Respiratory:  Regular, unlabored respirations on room air  NEURO:  Mental Status: AA&Ox3  Speech/Language: speech is without dysarthria or aphasia.   Cranial Nerves:  II: PERRL. Visual fields full.  III, IV, VI: EOMI. Eyelids elevate symmetrically.  V: Sensation is intact to light touch and symmetrical to face.  VII: Smile is symmetrical.  VIII: hearing intact to voice. IX, X: Palate elevates symmetrically. Phonation is normal.  KP:Dynloizm shrug 5/5. XII: tongue is midline without fasciculations. Motor: 5/5 strength to all muscle groups tested.  Tone: is normal and bulk is normal Sensation- Intact to light touch bilaterally.  Coordination: FTN intact bilaterally Gait- deferred  1a Level of Conscious.: 0 1b LOC Questions: 0 1c LOC Commands: 0 2 Best Gaze: 0 3 Visual: 0 4 Facial Palsy: 0 5a Motor Arm - left: 0 5b Motor Arm - Right: 0 6a Motor Leg - Left: 0 6b Motor Leg - Right: 0 7 Limb Ataxia: 0 8 Sensory: 0 9 Best Language: 0 10 Dysarthria: 0 11 Extinct. and Inatten.: 0 TOTAL: 0   Discharge Diet       Diet   Diet regular Room service appropriate? Yes; Fluid consistency: Thin   liquids  DISCHARGE PLAN Disposition:  Home aspirin 81 mg daily for secondary stroke prevention  Ongoing stroke risk factor control by Primary Care Physician at time of discharge Follow-up PCP Towana Small, FNP in 2 weeks. Follow up with cardiology for outpatient TEE and  possible PFO closure Follow-up in [facility] in 8 weeks, office to schedule an appointment.   35 minutes were spent preparing discharge.  Ana Liaw E Everitt Clint Kill , MSN, AGACNP-BC [facility] See Amion for schedule and pager information 05/25/2024 5:52 PM

## 2024-05-25 NOTE — Progress Notes (Signed)
 SLP Cancellation Note  Patient Details Name: DANYAL ADORNO MRN: [id number] DOB: 10/05/1990   Cancelled treatment:       Reason Eval/Treat Not Completed: SLP screened, no needs identified, will sign off   Emilie Carp, Consuelo Fitch 05/25/2024, 3:01 PM

## 2024-05-25 NOTE — Discharge Instructions (Signed)
 Ms. Shannon Huerta, you came to the hospital with sudden onset right arm and face numbness and right arm weakness.  You were given TNK to treat a ;likely stroke, and your symptoms resolved.  You were found to have a small PFO on a transcranial doppler study and will need to follow up with cardiology for an outpatient TEE.  You will need to discuss possible closure of the PFO with cardiology.  Please take 81 mg of aspirin daily and be seen by Dr. Donita within the next 8 weeks.  You will need to take atorvastatin daily to lower your cholesterol, but please stop this medication if you plan to become pregnant.

## 2024-05-25 NOTE — Progress Notes (Signed)
 BLE venous duplex and TCD bubble study have been completed.   Results can be found under chart review under CV PROC. 05/25/2024 6:36 PM Njeri Vicente RVT, RDMS

## 2024-05-25 NOTE — Progress Notes (Addendum)
 STROKE TEAM PROGRESS NOTE    SIGNIFICANT HOSPITAL EVENTS 10/18- TNK given  INTERIM HISTORY/SUBJECTIVE Patient remains hemodynamically stable and afebrile overnight.  Her MRI was negative for acute infarct.  She reveals that her strokelike symptoms started after she yawned while fixing her son's bed and that she did not have a headache at the time.  Given this, we will perform TCD bubble study and bilateral lower extremity ultrasound to look for PFO and paradoxical embolism.  Today, patient reports that she has migraines about 2-3 times a week do not worsen when she is lying flat.  Today she denies daily headaches or blurred vision hence we will cancel plan for spinal tap and defer it to her outpatient neurologist Dr. Margaret to consider if he is thinking about BIH particularly since she has borderline Chiari I on her MRI And felt she may have vestibular migraine phenomena rather than BIH OBJECTIVE  CBC    Component Value Date/Time   WBC 13.1 (H) 05/24/2024 0557   RBC 5.32 (H) 05/24/2024 0557   HGB 12.9 05/24/2024 0557   HGB 13.3 06/24/2023 1101   HCT 39.5 05/24/2024 0557   HCT 41.8 06/24/2023 1101   PLT 279 05/24/2024 0557   PLT 314 06/24/2023 1101   MCV 74.2 (L) 05/24/2024 0557   MCV 77 (L) 06/24/2023 1101   MCH 24.2 (L) 05/24/2024 0557   MCHC 32.7 05/24/2024 0557   RDW 16.1 (H) 05/24/2024 0557   RDW 15.2 06/24/2023 1101   LYMPHSABS 1.8 05/23/2024 0809   LYMPHSABS 2.2 06/24/2023 1101   MONOABS 0.5 05/23/2024 0809   EOSABS 0.1 05/23/2024 0809   EOSABS 0.2 06/24/2023 1101   BASOSABS 0.0 05/23/2024 0809   BASOSABS 0.0 06/24/2023 1101    BMET    Component Value Date/Time   NA 138 05/25/2024 0510   NA 140 06/24/2023 1101   K 3.6 05/25/2024 0510   CL 107 05/25/2024 0510   CO2 23 05/25/2024 0510   GLUCOSE 109 (H) 05/25/2024 0510   BUN 10 05/25/2024 0510   BUN 10 06/24/2023 1101   CREATININE 0.84 05/25/2024 0510   CREATININE 0.86 04/13/2023 1102   CALCIUM 8.4 (L)  05/25/2024 0510   EGFR 117 06/24/2023 1101   GFRNONAA >60 05/25/2024 0510   GFRNONAA >60 04/13/2023 1102    IMAGING past 24 hours ECHOCARDIOGRAM COMPLETE Result Date: 05/25/2024    ECHOCARDIOGRAM REPORT   Patient Name:   Shannon Huerta Date of Exam: 05/25/2024 Medical Rec #:  [id number]     Height:       66.0 in Accession #:    [id number]    Weight:       185.0 lb Date of Birth:  [birth year]      BSA:          1.935 m Patient Age:    33 years      BP:           116/78 mmHg Patient Gender: F             HR:           90 bpm. Exam Location:  Inpatient Procedure: 2D Echo, Cardiac Doppler and Color Doppler (Both Spectral and Color            Flow Doppler were utilized during procedure). Indications:    Stroke I63.9  History:        Patient has no prior history of Echocardiogram examinations.  Stroke; Signs/Symptoms:Fatigue and Dizziness/Lightheadedness.                 H/O Palpitations, Chiarimalformation type I.  Sonographer:    BERNARDA ROCKS Referring Phys: [id number] DENISE A WOLFE IMPRESSIONS  1. Left ventricular ejection fraction, by estimation, is 55 to 60%. The left ventricle has normal function. The left ventricle has no regional wall motion abnormalities. Left ventricular diastolic parameters were normal.  2. Right ventricular systolic function is normal. The right ventricular size is normal.  3. The mitral valve is normal in structure. Trivial mitral valve regurgitation. No evidence of mitral stenosis.  4. The aortic valve is normal in structure. Aortic valve regurgitation is not visualized. No aortic stenosis is present.  5. The inferior vena cava is normal in size with greater than 50% respiratory variability, suggesting right atrial pressure of 3 mmHg.  6. Cannot exclude a small PFO. Comparison(s): No prior Echocardiogram. Conclusion(s)/Recommendation(s): Cannot exclude ASD/PFO. Consider transesophageal echocardiogram, if clinically indicated. FINDINGS  Left Ventricle: Left ventricular  ejection fraction, by estimation, is 55 to 60%. The left ventricle has normal function. The left ventricle has no regional wall motion abnormalities. The left ventricular internal cavity size was normal in size. There is  no left ventricular hypertrophy. Left ventricular diastolic parameters were normal. Right Ventricle: The right ventricular size is normal. No increase in right ventricular wall thickness. Right ventricular systolic function is normal. Left Atrium: Left atrial size was normal in size. Right Atrium: Right atrial size was normal in size. Pericardium: There is no evidence of pericardial effusion. Mitral Valve: The mitral valve is normal in structure. Trivial mitral valve regurgitation. No evidence of mitral valve stenosis. MV peak gradient, 3.2 mmHg. The mean mitral valve gradient is 2.0 mmHg. Tricuspid Valve: The tricuspid valve is normal in structure. Tricuspid valve regurgitation is trivial. No evidence of tricuspid stenosis. Aortic Valve: The aortic valve is normal in structure. Aortic valve regurgitation is not visualized. No aortic stenosis is present. Aortic valve mean gradient measures 3.0 mmHg. Aortic valve peak gradient measures 5.7 mmHg. Aortic valve area, by VTI measures 3.82 cm. Pulmonic Valve: The pulmonic valve was normal in structure. Pulmonic valve regurgitation is not visualized. No evidence of pulmonic stenosis. Aorta: The aortic root and ascending aorta are structurally normal, with no evidence of dilitation. Venous: The inferior vena cava is normal in size with greater than 50% respiratory variability, suggesting right atrial pressure of 3 mmHg. IAS/Shunts: Cannot exclude a small PFO.  LEFT VENTRICLE PLAX 2D LVIDd:         4.50 cm      Diastology LVIDs:         3.20 cm      LV e' medial:    8.92 cm/s LV PW:         0.80 cm      LV E/e' medial:  8.6 LV IVS:        0.60 cm      LV e' lateral:   13.30 cm/s LVOT diam:     2.10 cm      LV E/e' lateral: 5.8 LV SV:         86 LV SV  Index:   44 LVOT Area:     3.46 cm  LV Volumes (MOD) LV vol d, MOD A2C: 115.0 ml LV vol d, MOD A4C: 118.0 ml LV vol s, MOD A2C: 55.1 ml LV vol s, MOD A4C: 41.7 ml LV SV MOD A2C:     59.9 ml  LV SV MOD A4C:     118.0 ml LV SV MOD BP:      69.9 ml RIGHT VENTRICLE             IVC RV Basal diam:  3.40 cm     IVC diam: 1.90 cm RV S prime:     10.20 cm/s TAPSE (M-mode): 2.2 cm LEFT ATRIUM             Index        RIGHT ATRIUM           Index LA diam:        3.20 cm 1.65 cm/m   RA Area:     11.80 cm LA Vol (A2C):   34.3 ml 17.73 ml/m  RA Volume:   24.90 ml  12.87 ml/m LA Vol (A4C):   44.8 ml 23.16 ml/m LA Biplane Vol: 42.4 ml 21.92 ml/m  AORTIC VALVE                    PULMONIC VALVE AV Area (Vmax):    3.75 cm     PV Vmax:          0.93 m/s AV Area (Vmean):   3.22 cm     PV Peak grad:     3.4 mmHg AV Area (VTI):     3.82 cm     PR End Diast Vel: 2.46 msec AV Vmax:           119.00 cm/s AV Vmean:          83.700 cm/s AV VTI:            0.225 m AV Peak Grad:      5.7 mmHg AV Mean Grad:      3.0 mmHg LVOT Vmax:         129.00 cm/s LVOT Vmean:        77.900 cm/s LVOT VTI:          0.248 m LVOT/AV VTI ratio: 1.10  AORTA Ao Root diam: 3.20 cm Ao Asc diam:  3.20 cm MITRAL VALVE MV Area (PHT): 8.07 cm    SHUNTS MV Area VTI:   4.59 cm    Systemic VTI:  0.25 m MV Peak grad:  3.2 mmHg    Systemic Diam: 2.10 cm MV Mean grad:  2.0 mmHg MV Vmax:       0.89 m/s MV Vmean:      63.6 cm/s MV Decel Time: 94 msec MR Peak grad: 33.9 mmHg MR Vmax:      291.00 cm/s MV E velocity: 76.60 cm/s MV A velocity: 80.40 cm/s MV E/A ratio:  0.95 Emeline Calender Electronically signed by Emeline Calender Signature Date/Time: 05/25/2024/10:36:47 AM    Final     Vitals:   05/25/24 0700 05/25/24 0800 05/25/24 0900 05/25/24 1200  BP: 116/78 121/80  111/67  Pulse: 96   97  Resp: 17     Temp:      TempSrc:      SpO2: 94%   98%  Weight:      Height:   5' 6 (1.676 m)      PHYSICAL EXAM General:  Alert, well-nourished, well-developed patient in  no acute distress Psych:  Mood and affect appropriate for situation CV: Regular rate and rhythm on monitor Respiratory:  Regular, unlabored respirations on room air Skin: Scattered hives involving the face and extremities  NEURO:  Mental Status: AA&Ox3, patient is able to give clear and coherent  history Speech/Language: speech is without dysarthria or aphasia.  Naming, repetition, fluency, and comprehension intact.  Cranial Nerves:  II: PERRL. Visual fields full.  III, IV, VI: EOMI. Eyelids elevate symmetrically.  V: Sensation is intact to light touch and symmetrical to face.  VII: Face is symmetrical resting and smiling VIII: hearing intact to voice. IX, X: Palate elevates symmetrically. Phonation is normal.  XII: tongue is midline without fasciculations. Motor: 5/5 strength to all muscle groups tested.  Tone: is normal and bulk is normal Sensation- Intact to light touch bilaterally.  Coordination: FTN intact bilaterally Gait- steady  Most Recent NIH 0    ASSESSMENT/PLAN  Shannon Huerta is a 33 y.o. female with history of  migraines, Chiari I, chronic hives, asthma, anxiety, who presents as transfer from [facility] ED after receiving TNK.  Patient reports that her symptoms started when she yawned while fixing her son's bed.  She did not have a headache at the time and noticed sudden onset right arm numbness and weakness.  She reports having migraines about 2-3 times a week and states they do not worsen when she is lying flat.  NIH on Admission 0  Strokelike episode versus TIA status post IV TNK treatment Etiology: Unclear at this time ?  Paradoxical embolism versus Chiari I malformation made worse by yawning Code Stroke CT head No acute abnormality. ASPECTS 10.    CTA head & neck No LCO, fetal PCA and diminutive basilar artery terminating in the SCA bilaterally  MRI No acute intracranial abnormality.  MRV Unremarkable MRV of the head.  2D Echo EF 55 to 60%, normal  left atrial size, cannot exclude small PFO Bilateral lower extremity ultrasound pending TCD bubble study pending Hypercoag panel in process  LDL 132 HgbA1c 5.4 VTE prophylaxis - SCDs No antithrombotic prior to admission, now on aspirin 81 mg daily  Therapy recommendations:  No follow up needed  Disposition:  Discharge home pending completion of work up   Concern for IIH Multiple features of her headache as described above are concerning for high pressure headache especially in the setting of weight gain.  Patients can have comorbid IIH and migraines with exacerbation of migraine headaches in the setting of untreated IIH and therefore this could be contributing to her presentation.  Additionally merits inpatient workup due to vision changes.  However, patient now states that headache does not worsen when she is lying flat, making IIH less of a concern.  Also, BMI is less than 30.  Will discontinue lumbar puncture under fluoroscopy.  Moreover she has been seen by neurologist Dr. Margaret who was thinking more of vestibular migraine and MRI shows low-lying cerebellar tonsils PT/INR, PTT obtained prior to LP, normal Bedside LP attempted more than 24 hours after TNK for safety, unsuccessful   History of migraines Risk/benefit of triptans, needs to be discussed with patient (historically contraindicated though more recent data suggest this risk may be overstated) Potential component of medication overuse headache due to daily Tylenol use  Hyperlipidemia LDL 132, goal < 70 Add Atorvastatin 80 mg daily Continue statin at discharge   Other Active Problems Anxiety Klonoprin, amitriptyline , xanax  Autoimmunie uticaria  Having hives today- prednisone  5mg  once today  Borderline Chiari Malformation  Follows with [facility] - no surgical intervention indicated  Outpatient follow up with ENT completed as well Hospital day # 2  Patient seen and examined by NP/APP with MD. MD to update note as  needed.   Cortney E Everitt Clint Kill ,  MSN, AGACNP-BC [facility] See Amion for schedule and pager information 05/25/2024 12:43 PM   I have personally obtained history,examined this patient, reviewed notes, independently viewed imaging studies, participated in medical decision making and plan of care.ROS completed by me personally and pertinent positives fully documented  I have made any additions or clarifications directly to the above note. Agree with note above.  Interesting 33 year old African-American lady presented with episode of yawning.  Going right face and body numbness and paresthesias that lasted for couple of hours and resolved after TNK administration.  Neurovascular imaging is all unremarkable.  MRI does show mild low-lying cerebellar tonsils on the right 4 to 5 mm but not showed this is responsible for his symptoms.  Check TCD bubble study for PFO and lower extremity venous Dopplers for DVT for paradoxical embolism.  Recommend aspirin alone for stroke prevention and aggressive risk factor modification.  Follow-up with outpatient neurologist Dr. Gigi  for headache treatment and consideration for spinal tap if he thinks she has benign intracranial hypertension   I personally spent a total of 50 minutes in the care of the patient today including getting/reviewing separately obtained history, performing a medically appropriate exam/evaluation, counseling and educating, placing orders, referring and communicating with other health care professionals, documenting clinical information in the EHR, independently interpreting results, and coordinating care.        Eather Popp, MD Medical Director [facility] Pager: [phone] 05/25/2024 1:15 PM   To contact Stroke Continuity provider, please refer to [email or web address]. After hours, contact General Neurology

## 2024-05-26 ENCOUNTER — Encounter: Payer: Self-pay | Admitting: Family Medicine

## 2024-05-26 ENCOUNTER — Ambulatory Visit (INDEPENDENT_AMBULATORY_CARE_PROVIDER_SITE_OTHER): Admitting: Family Medicine

## 2024-05-26 VITALS — BP 122/92 | HR 109 | Resp 16 | Ht 66.0 in | Wt 197.0 lb

## 2024-05-26 DIAGNOSIS — F410 Panic disorder [episodic paroxysmal anxiety] without agoraphobia: Secondary | ICD-10-CM

## 2024-05-26 DIAGNOSIS — L508 Other urticaria: Secondary | ICD-10-CM | POA: Diagnosis not present

## 2024-05-26 DIAGNOSIS — G935 Compression of brain: Secondary | ICD-10-CM

## 2024-05-26 DIAGNOSIS — F411 Generalized anxiety disorder: Secondary | ICD-10-CM | POA: Diagnosis not present

## 2024-05-26 DIAGNOSIS — I6389 Other cerebral infarction: Secondary | ICD-10-CM

## 2024-05-26 LAB — PROTEIN S ACTIVITY: Protein S Activity: 73 % (ref 63–140)

## 2024-05-26 LAB — PROTEIN S, TOTAL: Protein S Ag, Total: 96 % (ref 60–150)

## 2024-05-26 LAB — PROTEIN C ACTIVITY: Protein C Activity: 76 % (ref 73–180)

## 2024-05-26 MED ORDER — PREDNISONE 10 MG PO TABS: ORAL_TABLET | ORAL | 0 refills | Status: DC

## 2024-05-26 MED ORDER — ALPRAZOLAM 0.25 MG PO TABS: 0.5000 mg | ORAL_TABLET | Freq: Two times a day (BID) | ORAL | 1 refills | Status: DC | PRN

## 2024-05-26 NOTE — Progress Notes (Signed)
 Established Patient Office Visit  Subjective  Patient ID: Shannon Huerta, female    DOB: 09/19/1990  Age: 33 y.o. MRN: [id number]  Discussed the use of AI scribe software for clinical note transcription with the patient, who gave verbal consent to proceed.  History of Present Illness   Shannon Huerta is a 33 year old female who presents for a hospitalization follow-up. She has a history of Chiari malformation and recent PFO who presents with recent neurological symptoms and hives.  She experienced a sudden onset of right-sided facial numbness and arm weakness while performing a routine task. The numbness resembled the sensation after dental anesthesia. Her arm dropped involuntarily, causing difficulty in control and dropping her phone. These symptoms partially resolved, but tingling and weakness persisted.  Concurrently, she developed widespread hives, which were difficult to scratch due to lack of coordination in her hand. She was admitted to the ER on 05/23/2024, received TNK, and was transferred to the ICU. During her hospital stay, a spinal tap was performed, and a bubble test was done to evaluate for a PFO. The Chiari malformation was mentioned but not the focus of her treatment. She was discharged on 05/25/2024.   She has a history of migraines with aura, which were considered as a potential cause of her symptoms. However, she felt that this episode was different from her usual migraines.   Her hives are chronic, and she has been taking hydroxyzine  regularly. She reports feeling very anxious while admitted. She is considering restarting Xolair , which she has not used for a year.  She has been using alprazolam  (Xanax ) 0.25 mg, increasing the dose to 0.5 mg as needed for anxiety, and Klonopin  at night if required. She alternates between them, as the Klonopin  often makes her more lethargic. She is also on prednisone  PRN for her hives.   She experiences significant anxiety related to her  symptoms and the potential for a stroke, given her young age and recent experiences. She is concerned about the side effects of prednisone , including mood changes and increased urination.     ROS: see HPI     Objective:    BP (!) 122/92   Pulse (!) 109   Resp 16   Ht 5' 6 (1.676 m)   Wt 197 lb (89.4 kg)   LMP 05/19/2024   SpO2 92%   Breastfeeding No   BMI 31.80 kg/m  BP Readings from Last 3 Encounters:  06/02/24 124/82  05/27/24 122/82  05/26/24 (!) 122/92    Physical Exam Vitals reviewed.  Constitutional:      Appearance: Normal appearance.  Cardiovascular:     Rate and Rhythm: Normal rate and regular rhythm.     Pulses: Normal pulses.     Heart sounds: Normal heart sounds.  Pulmonary:     Effort: Pulmonary effort is normal.     Breath sounds: Normal breath sounds.  Neurological:     General: No focal deficit present.     Mental Status: She is alert. Mental status is at baseline.     Cranial Nerves: No cranial nerve deficit.     Sensory: No sensory deficit.     Motor: No weakness.     Coordination: Coordination normal.     Gait: Gait normal.     Deep Tendon Reflexes: Reflexes normal.  Psychiatric:        Mood and Affect: Mood normal.        Behavior: Behavior normal.     Assessment &  Plan:   1. 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 Recent stroke likely due to PFO. TNK administered, preventing permanent deficits. Neurologist confirmed PFO with bubble test. Urgent TEE and cardiology referral needed due to recurrent stroke risk. Order TEE at [facility]. Place urgent referral to [facility] cardiology. - Ambulatory referral to Neurosurgery - Ambulatory referral to Cardiology - ECHO TEE; Future  2. Chiari malformation type I [facility]) Refer to a new neurologist for second opinion, particularly one specializing in Chiari  malformation. - Ambulatory referral to Neurosurgery  3. Generalized anxiety disorder with panic attacks Anxiety contributing to chronic urticaria. Managed with alprazolam  and Klonopin . Preference for alprazolam  due to shorter duration and less drowsiness. Increase alprazolam  dose to 0.5 mg. Provide refill for alprazolam . Advised patient to discontinue Klonopin  and to NOT take concurrently with Xanax .  - ALPRAZolam  (XANAX ) 0.25 MG tablet; Take 2 tablets (0.5 mg total) by mouth 2 (two) times daily as needed for anxiety.  Dispense: 30 tablet; Refill: 1  4. Chronic autoimmune urticaria Chronic urticaria exacerbated, possibly anxiety-driven. Previous treatment included hydroxyzine  and Klonopin . Considering Xolair . Hives localized to hands. Prescribe prednisone  10 mg with instructions to adjust dose based on response. Consider resuming Xolair  by allergist.  - predniSONE  (DELTASONE ) 10 MG tablet; Take 10 mg prednisone  to take up to twice daily as needed for hives.  Dispense: 20 tablet; Refill: 0   Return in about 6 weeks (around 07/07/2024) for Mood f/u (virtual) .    Evalene Arts, FNP

## 2024-05-26 NOTE — Patient Instructions (Signed)

## 2024-05-27 ENCOUNTER — Ambulatory Visit: Attending: Cardiovascular Disease | Admitting: Cardiovascular Disease

## 2024-05-27 ENCOUNTER — Encounter: Payer: Self-pay | Admitting: *Deleted

## 2024-05-27 VITALS — BP 122/82 | HR 106 | Ht 67.0 in | Wt 201.3 lb

## 2024-05-27 DIAGNOSIS — Q2112 Patent foramen ovale: Secondary | ICD-10-CM | POA: Diagnosis not present

## 2024-05-27 DIAGNOSIS — R002 Palpitations: Secondary | ICD-10-CM

## 2024-05-27 DIAGNOSIS — E782 Mixed hyperlipidemia: Secondary | ICD-10-CM

## 2024-05-27 DIAGNOSIS — E785 Hyperlipidemia, unspecified: Secondary | ICD-10-CM | POA: Insufficient documentation

## 2024-05-27 LAB — PROTEIN C, TOTAL: Protein C, Total: 76 % (ref 60–150)

## 2024-05-27 NOTE — Assessment & Plan Note (Signed)
 Patient was admitted to the hospital for 3 days 05/23/2024 with strokelike symptoms.  This began with a URI and then paresthesias and paresis on the right side.  She received TNK with resolution of her symptoms.  Imaging showed no acute stroke however.  Transthoracic echo did suggest a PFO as did transcranial Doppler.  She has been referred to the structural heart clinic.  I am going to get a transesophageal echo and a Zio patch to further evaluate.

## 2024-05-27 NOTE — Assessment & Plan Note (Signed)
 History of heart palpitations in the past.  In light of her recent aborted stroke I am going to get a 30-day event monitor to rule out A-fib.

## 2024-05-27 NOTE — Patient Instructions (Signed)
 Medication Instructions:  Your physician recommends that you continue on your current medications as directed. Please refer to the Current Medication list given to you today.  *If you need a refill on your cardiac medications before your next appointment, please call your pharmacy*  Lab Work: Your physician recommends that you have labs drawn today: BMET & CBC  If you have labs (blood work) drawn today and your tests are completely normal, you will receive your results only by: MyChart Message (if you have MyChart) OR A paper copy in the mail If you have any lab test that is abnormal or we need to change your treatment, we will call you to review the results.  Testing/Procedures: [employer] Cardiac Event Monitor Instructions  Your physician has requested you wear your cardiac event monitor for 30 days. [employer] may call or text to confirm a shipping address. The monitor will be sent to a land address via UPS. [employer] will not ship a monitor to a PO BOX. It typically takes 3-5 days to receive your monitor after it has been enrolled. [employer] will assist with USPS tracking if your package is delayed. The telephone number for [employer] is [phone]. Once you have received your monitor, please review the enclosed instructions. Instruction tutorials can also be viewed under help and settings on the enclosed cell phone. Your monitor has already been registered assigning a specific monitor serial # to you.  Billing and Self Pay Discount Information  [employer] has been provided the insurance information we had on file for you.  If your insurance has been updated, please call [employer] at [phone] to provide them with your updated insurance information.   [employer] offers a discounted Self Pay option for patients who have insurance that does not cover their cardiac event monitor or patients without insurance.  The discounted cost of a Self Pay Cardiac Event Monitor would be  $225.00 , if the patient contacts [employer] at [phone] within 7 days of applying the monitor to make payment arrangements.  If the patient does not contact [employer] within 7 days of applying the monitor, the cost of the cardiac event monitor will be $350.00.  Applying the monitor  Remove cell phone from case and turn it on. The cell phone works as IT consultant and needs to be within [employer] of you at all times. The cell phone will need to be charged on a daily basis. We recommend you plug the cell phone into the enclosed charger at your bedside table every night.  Monitor batteries: You will receive two monitor batteries labelled #1 and #2. These are your recorders. Plug battery #2 onto the second connection on the enclosed charger. Keep one battery on the charger at all times. This will keep the monitor battery deactivated. It will also keep it fully charged for when you need to switch your monitor batteries. A small light will be blinking on the battery emblem when it is charging. The light on the battery emblem will remain on when the battery is fully charged.  Open package of a Monitor strip. Insert battery #1 into black hood on strip and gently squeeze monitor battery onto connection as indicated in instruction booklet. Set aside while preparing skin.  Choose location for your strip, vertical or horizontal, as indicated in the instruction booklet. Shave to remove all hair from location. There cannot be any lotions, oils, powders, or colognes on skin where monitor is to be applied. Wipe skin clean with enclosed Saline wipe. Dry skin completely.  Peel paper labeled #1 off the back of the Monitor strip exposing the adhesive. Place the monitor on the chest in the vertical or horizontal position shown in the instruction booklet. One arrow on the monitor strip must be pointing upward. Carefully remove paper labeled #2, attaching remainder of strip to your skin. Try not to create  any folds or wrinkles in the strip as you apply it.  Firmly press and release the circle in the center of the monitor battery. You will hear a small beep. This is turning the monitor battery on. The heart emblem on the monitor battery will light up every 5 seconds if the monitor battery in turned on and connected to the patient securely. Do not push and hold the circle down as this turns the monitor battery off. The cell phone will locate the monitor battery. A screen will appear on the cell phone checking the connection of your monitor strip. This may read poor connection initially but change to good connection within the next minute. Once your monitor accepts the connection you will hear a series of 3 beeps followed by a climbing crescendo of beeps. A screen will appear on the cell phone showing the two monitor strip placement options. Touch the picture that demonstrates where you applied the monitor strip.  Your monitor strip and battery are waterproof. You are able to shower, bathe, or swim with the monitor on. They just ask you do not submerge deeper than 3 feet underwater. We recommend removing the monitor if you are swimming in a lake, river, or ocean.  Your monitor battery will need to be switched to a fully charged monitor battery approximately once a week. The cell phone will alert you of an action which needs to be made.  On the cell phone, tap for details to reveal connection status, monitor battery status, and cell phone battery status. The green dots indicates your monitor is in good status. A red dot indicates there is something that needs your attention.  To record a symptom, click the circle on the monitor battery. In 30-60 seconds a list of symptoms will appear on the cell phone. Select your symptom and tap save. Your monitor will record a sustained or significant arrhythmia regardless of you clicking the button. Some patients do not feel the heart rhythm  irregularities. [employer] will notify us  of any serious or critical events.  Refer to instruction booklet for instructions on switching batteries, changing strips, the Do not disturb or Pause features, or any additional questions.  Call [employer] at [phone], to confirm your monitor is transmitting and record your baseline. They will answer any questions you may have regarding the monitor instructions at that time.  Returning the monitor to [employer]  Place all equipment back into blue box. Peel off strip of paper to expose adhesive and close box securely. There is a prepaid UPS shipping label on this box. Drop in a UPS drop box, or at a UPS facility like [employer]. You may also contact [employer] to arrange UPS to pick up monitor package at your home.   Follow-Up: At [facility], you and your health needs are our priority.  As part of our continuing mission to provide you with exceptional heart care, our providers are all part of one team.  This team includes your primary Cardiologist (physician) and Advanced Practice Providers or APPs (Physician Assistants and Nurse Practitioners) who all work together to provide you with the care you need, when you need it.  Your next appointment:   3 month(s)  Provider:   Dorn Lesches, MD    We recommend signing up for the patient portal called MyChart.  Sign up information is provided on this After Visit Summary.  MyChart is used to connect with patients for Virtual Visits (Telemedicine).  Patients are able to view lab/test results, encounter notes, upcoming appointments, etc.  Non-urgent messages can be sent to your provider as well.   To learn more about what you can do with MyChart, go to [email or web address].   Other Instructions    You are scheduled for a TEE (Transesophageal Echocardiogram) on Friday, November 7 with Dr. Loni.  Please arrive at the [facility] (Main Entrance A) at [facility]: [address] [place], [place] [address] at 11:30 AM (This time is 1 hour(s) before your procedure to ensure your preparation).   Free valet parking service is available. You will check in at ADMITTING.   *Please Note: You will receive a call the day before your procedure to confirm the appointment time. That time may have changed from the original time based on the schedule for that day.*    DIET:  Nothing to eat or drink after midnight except a sip of water with medications (see medication instructions below)  MEDICATION INSTRUCTIONS: !!IF ANY NEW MEDICATIONS ARE STARTED AFTER TODAY, PLEASE NOTIFY YOUR PROVIDER AS SOON AS POSSIBLE!!  FYI: Medications such as Semaglutide (Ozempic, [place]), Tirzepatide (Mounjaro, Zepbound), Dulaglutide (Trulicity), etc (GLP1 agonists) AND Canagliflozin (Invokana), Dapagliflozin (Farxiga), Empagliflozin (Jardiance), Ertugliflozin (Steglatro), Bexagliflozin [employer]) or any combination with one of these drugs such as Invokamet (Canagliflozin/Metformin), Synjardy (Empagliflozin/Metformin), etc (SGLT2 inhibitors) must be held around the time of a procedure. This is not a comprehensive list of all of these drugs. Please review all of your medications and talk to your provider if you take any one of these. If you are not sure, ask your provider.   LABS: drawn today (10/22).  FYI:  For your safety, and to allow us  to monitor your vital signs accurately during the surgery/procedure we request: If you have artificial nails, gel coating, SNS etc, please have those removed prior to your surgery/procedure. Not having the nail coverings /polish removed may result in cancellation or delay of your surgery/procedure.  Your support person will be asked to wait in the waiting room during your procedure.  It is OK to have someone drop you off and come back when you are ready to be discharged.  You cannot drive after the procedure and will need someone to drive you home.  Bring your  insurance cards.  *Special Note: Every effort is made to have your procedure done on time. Occasionally there are emergencies that occur at the hospital that may cause delays. Please be patient if a delay does occur.

## 2024-05-27 NOTE — H&P (View-Only) (Signed)
 05/27/2024 Shannon Huerta   1991-01-16  [id number]  Primary Physician Towana Small, FNP Primary Cardiologist: Dorn JINNY Lesches MD GENI CODY MADEIRA, FSCAI  HPI:  Shannon Huerta is a 33 y.o. 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  She was referred by her primary care provider, Tawni Towana, FNP, for evaluation of a recent stroke.  Her risk factors include untreated hyperlipidemia recently started on statin therapy.  Her mother apparently had congenital heart surgery as a young woman the exact nature of which is unclear.  She has never had a heart attack.  She denies chest pain but does get episodically short of breath.  She admits to not being very active.  She also has anxiety disorder and reactive airways disease on medications.  She had onset of strokelike symptoms on 05/23/2024 and went to emergency room.  She was evaluated and treated with TNK early with resolution of her symptoms.  Imaging modalities were unrevealing.  2D echo suggested PFO and transcranial Doppler confirmed this.   Current Meds  Medication Sig   albuterol  (VENTOLIN  HFA) 108 (90 Base) MCG/ACT inhaler Inhale 2 puffs into the lungs every 6 (six) hours as needed for wheezing or shortness of breath.   ALPRAZolam  (XANAX ) 0.25 MG tablet Take 2 tablets (0.5 mg total) by mouth 2 (two) times daily as needed for anxiety.   amitriptyline  (ELAVIL ) 25 MG tablet Take 25 mg by mouth at bedtime as needed for sleep.   aspirin EC 81 MG tablet Take 1 tablet (81 mg total) by mouth daily. Swallow whole.   atorvastatin (LIPITOR) 80 MG tablet Take 1 tablet (80 mg total) by mouth at bedtime.   clonazePAM  (KLONOPIN ) 0.5 MG tablet Take 0.5 mg by mouth 2 (two) times daily as needed for anxiety.   EPINEPHrine  0.3 mg/0.3 mL IJ SOAJ injection Inject 0.3 mg into the muscle as needed for anaphylaxis.   famotidine  (PEPCID ) 20 MG tablet TAKE 1 TABLET(20  MG) BY MOUTH TWICE DAILY   hydrocortisone cream 1 % Apply topically 4 (four) times daily as needed for itching.   hydrOXYzine  (ATARAX ) 25 MG tablet Take 0.5-1 tablets (12.5-25 mg total) by mouth every 8 (eight) hours as needed for itching.   levocetirizine (XYZAL ) 5 MG tablet Take 1 tablet daily, can increase to 2 tablets twice daily as needed for hives.  This is maximum dose.  Do not take with other antihistamines such as hydroxyzine .   montelukast  (SINGULAIR ) 10 MG tablet Take 1 tablet (10 mg total) by mouth at bedtime.   predniSONE  (DELTASONE ) 10 MG tablet Take 10 mg prednisone  to take up to twice daily as needed for hives.   Current Facility-Administered Medications for the 05/27/24 encounter (Office Visit) with Lesches Dorn JINNY, MD  Medication   omalizumab  (XOLAIR ) injection 300 mg     No Known Allergies  Social History   Socioeconomic History   Marital status: Married    Spouse name: Not on file   Number of children: Not on file   Years of education: Not on file   Highest education level: Not on file  Occupational History   Not on file  Tobacco Use   Smoking status: Never    Passive exposure: Current   Smokeless tobacco: Never  Vaping Use   Vaping status: Never Used  Substance and Sexual Activity   Alcohol use: Yes    Comment: SOCIALLY  Drug use: Never   Sexual activity: Yes    Birth control/protection: None  Other Topics Concern   Not on file  Social History Narrative   Not on file   Social Drivers of Health   Financial Resource Strain: Not on file  Food Insecurity: No Food Insecurity (05/23/2024)   Hunger Vital Sign    Worried About Running Out of Food in the Last Year: Never true    Ran Out of Food in the Last Year: Never true  Transportation Needs: No Transportation Needs (05/23/2024)   PRAPARE - Administrator, Civil Service (Medical): No    Lack of Transportation (Non-Medical): No  Physical Activity: Not on file  Stress: Not on file   Social Connections: Not on file  Intimate Partner Violence: Not At Risk (05/23/2024)   Humiliation, Afraid, Rape, and Kick questionnaire    Fear of Current or Ex-Partner: No    Emotionally Abused: No    Physically Abused: No    Sexually Abused: No     Review of Systems: General: negative for chills, fever, night sweats or weight changes.  Cardiovascular: negative for chest pain, dyspnea on exertion, edema, orthopnea, palpitations, paroxysmal nocturnal dyspnea or shortness of breath Dermatological: negative for rash Respiratory: negative for cough or wheezing Urologic: negative for hematuria Abdominal: negative for nausea, vomiting, diarrhea, bright red blood per rectum, melena, or hematemesis Neurologic: negative for visual changes, syncope, or dizziness All other systems reviewed and are otherwise negative except as noted above.    Blood pressure 122/82, pulse (!) 106, height 5' 7 (1.702 m), weight 201 lb 4.8 oz (91.3 kg), last menstrual period 05/19/2024, SpO2 98%.  General appearance: alert and no distress Neck: no adenopathy, no carotid bruit, no JVD, supple, symmetrical, trachea midline, and thyroid  not enlarged, symmetric, no tenderness/mass/nodules Lungs: clear to auscultation bilaterally Heart: regular rate and rhythm, S1, S2 normal, no murmur, click, rub or gallop Extremities: extremities normal, atraumatic, no cyanosis or edema Pulses: 2+ and symmetric Skin: Skin color, texture, turgor normal. No rashes or lesions Neurologic: Grossly normal  EKG not performed today      ASSESSMENT AND PLAN:   Heart palpitations History of heart palpitations in the past.  In light of her recent aborted stroke I am going to get a 30-day event monitor to rule out A-fib.  PFO (patent foramen ovale) Patient was admitted to the hospital for 3 days 05/23/2024 with strokelike symptoms.  This began with a URI and then paresthesias and paresis on the right side.  She received TNK with  resolution of her symptoms.  Imaging showed no acute stroke however.  Transthoracic echo did suggest a PFO as did transcranial Doppler.  She has been referred to the structural heart clinic.  I am going to get a transesophageal echo and a Zio patch to further evaluate.  Hyperlipidemia History of hyperlipidemia with recent lipid profile performed 05/24/2024 revealing total cholesterol 198, LDL 132 and HDL 36.  She was begun on high-dose atorvastatin.  We will recheck a lipid liver profile in 3 months.     Dorn DOROTHA Lesches MD FACP,FACC,FAHA, [facility] 05/27/2024 11:08 AM

## 2024-05-27 NOTE — Progress Notes (Signed)
 Patient enrolled for [employer]/ [employer] to ship a 30 day cardiac event monitor to her address on file. Requested Hydrocolloid Strips.

## 2024-05-27 NOTE — Progress Notes (Signed)
 05/27/2024 Shannon Huerta   1991-01-16  [id number]  Primary Physician Towana Small, FNP Primary Cardiologist: Dorn JINNY Lesches MD GENI CODY MADEIRA, FSCAI  HPI:  Shannon Huerta is a 33 y.o. 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  She was referred by her primary care provider, Tawni Towana, FNP, for evaluation of a recent stroke.  Her risk factors include untreated hyperlipidemia recently started on statin therapy.  Her mother apparently had congenital heart surgery as a young woman the exact nature of which is unclear.  She has never had a heart attack.  She denies chest pain but does get episodically short of breath.  She admits to not being very active.  She also has anxiety disorder and reactive airways disease on medications.  She had onset of strokelike symptoms on 05/23/2024 and went to emergency room.  She was evaluated and treated with TNK early with resolution of her symptoms.  Imaging modalities were unrevealing.  2D echo suggested PFO and transcranial Doppler confirmed this.   Current Meds  Medication Sig   albuterol  (VENTOLIN  HFA) 108 (90 Base) MCG/ACT inhaler Inhale 2 puffs into the lungs every 6 (six) hours as needed for wheezing or shortness of breath.   ALPRAZolam  (XANAX ) 0.25 MG tablet Take 2 tablets (0.5 mg total) by mouth 2 (two) times daily as needed for anxiety.   amitriptyline  (ELAVIL ) 25 MG tablet Take 25 mg by mouth at bedtime as needed for sleep.   aspirin EC 81 MG tablet Take 1 tablet (81 mg total) by mouth daily. Swallow whole.   atorvastatin (LIPITOR) 80 MG tablet Take 1 tablet (80 mg total) by mouth at bedtime.   clonazePAM  (KLONOPIN ) 0.5 MG tablet Take 0.5 mg by mouth 2 (two) times daily as needed for anxiety.   EPINEPHrine  0.3 mg/0.3 mL IJ SOAJ injection Inject 0.3 mg into the muscle as needed for anaphylaxis.   famotidine  (PEPCID ) 20 MG tablet TAKE 1 TABLET(20  MG) BY MOUTH TWICE DAILY   hydrocortisone cream 1 % Apply topically 4 (four) times daily as needed for itching.   hydrOXYzine  (ATARAX ) 25 MG tablet Take 0.5-1 tablets (12.5-25 mg total) by mouth every 8 (eight) hours as needed for itching.   levocetirizine (XYZAL ) 5 MG tablet Take 1 tablet daily, can increase to 2 tablets twice daily as needed for hives.  This is maximum dose.  Do not take with other antihistamines such as hydroxyzine .   montelukast  (SINGULAIR ) 10 MG tablet Take 1 tablet (10 mg total) by mouth at bedtime.   predniSONE  (DELTASONE ) 10 MG tablet Take 10 mg prednisone  to take up to twice daily as needed for hives.   Current Facility-Administered Medications for the 05/27/24 encounter (Office Visit) with Lesches Dorn JINNY, MD  Medication   omalizumab  (XOLAIR ) injection 300 mg     No Known Allergies  Social History   Socioeconomic History   Marital status: Married    Spouse name: Not on file   Number of children: Not on file   Years of education: Not on file   Highest education level: Not on file  Occupational History   Not on file  Tobacco Use   Smoking status: Never    Passive exposure: Current   Smokeless tobacco: Never  Vaping Use   Vaping status: Never Used  Substance and Sexual Activity   Alcohol use: Yes    Comment: SOCIALLY  Drug use: Never   Sexual activity: Yes    Birth control/protection: None  Other Topics Concern   Not on file  Social History Narrative   Not on file   Social Drivers of Health   Financial Resource Strain: Not on file  Food Insecurity: No Food Insecurity (05/23/2024)   Hunger Vital Sign    Worried About Running Out of Food in the Last Year: Never true    Ran Out of Food in the Last Year: Never true  Transportation Needs: No Transportation Needs (05/23/2024)   PRAPARE - Administrator, Civil Service (Medical): No    Lack of Transportation (Non-Medical): No  Physical Activity: Not on file  Stress: Not on file   Social Connections: Not on file  Intimate Partner Violence: Not At Risk (05/23/2024)   Humiliation, Afraid, Rape, and Kick questionnaire    Fear of Current or Ex-Partner: No    Emotionally Abused: No    Physically Abused: No    Sexually Abused: No     Review of Systems: General: negative for chills, fever, night sweats or weight changes.  Cardiovascular: negative for chest pain, dyspnea on exertion, edema, orthopnea, palpitations, paroxysmal nocturnal dyspnea or shortness of breath Dermatological: negative for rash Respiratory: negative for cough or wheezing Urologic: negative for hematuria Abdominal: negative for nausea, vomiting, diarrhea, bright red blood per rectum, melena, or hematemesis Neurologic: negative for visual changes, syncope, or dizziness All other systems reviewed and are otherwise negative except as noted above.    Blood pressure 122/82, pulse (!) 106, height 5' 7 (1.702 m), weight 201 lb 4.8 oz (91.3 kg), last menstrual period 05/19/2024, SpO2 98%.  General appearance: alert and no distress Neck: no adenopathy, no carotid bruit, no JVD, supple, symmetrical, trachea midline, and thyroid  not enlarged, symmetric, no tenderness/mass/nodules Lungs: clear to auscultation bilaterally Heart: regular rate and rhythm, S1, S2 normal, no murmur, click, rub or gallop Extremities: extremities normal, atraumatic, no cyanosis or edema Pulses: 2+ and symmetric Skin: Skin color, texture, turgor normal. No rashes or lesions Neurologic: Grossly normal  EKG not performed today      ASSESSMENT AND PLAN:   Heart palpitations History of heart palpitations in the past.  In light of her recent aborted stroke I am going to get a 30-day event monitor to rule out A-fib.  PFO (patent foramen ovale) Patient was admitted to the hospital for 3 days 05/23/2024 with strokelike symptoms.  This began with a URI and then paresthesias and paresis on the right side.  She received TNK with  resolution of her symptoms.  Imaging showed no acute stroke however.  Transthoracic echo did suggest a PFO as did transcranial Doppler.  She has been referred to the structural heart clinic.  I am going to get a transesophageal echo and a Zio patch to further evaluate.  Hyperlipidemia History of hyperlipidemia with recent lipid profile performed 05/24/2024 revealing total cholesterol 198, LDL 132 and HDL 36.  She was begun on high-dose atorvastatin.  We will recheck a lipid liver profile in 3 months.     Dorn DOROTHA Lesches MD FACP,FACC,FAHA, [facility] 05/27/2024 11:08 AM

## 2024-05-27 NOTE — Assessment & Plan Note (Signed)
 History of hyperlipidemia with recent lipid profile performed 05/24/2024 revealing total cholesterol 198, LDL 132 and HDL 36.  She was begun on high-dose atorvastatin.  We will recheck a lipid liver profile in 3 months.

## 2024-05-28 ENCOUNTER — Ambulatory Visit: Payer: Self-pay | Admitting: Cardiovascular Disease

## 2024-05-28 LAB — CBC WITH DIFFERENTIAL/PLATELET
Basophils Absolute: 0 x10E3/uL (ref 0.0–0.2)
Basos: 0 %
EOS (ABSOLUTE): 0.1 x10E3/uL (ref 0.0–0.4)
Eos: 1 %
Hematocrit: 36.6 % (ref 34.0–46.6)
Hemoglobin: 11.6 g/dL (ref 11.1–15.9)
Immature Grans (Abs): 0.1 x10E3/uL (ref 0.0–0.1)
Immature Granulocytes: 1 %
Lymphocytes Absolute: 2.6 x10E3/uL (ref 0.7–3.1)
Lymphs: 21 %
MCH: 24.1 pg — ABNORMAL LOW (ref 26.6–33.0)
MCHC: 31.7 g/dL (ref 31.5–35.7)
MCV: 76 fL — ABNORMAL LOW (ref 79–97)
Monocytes Absolute: 0.6 x10E3/uL (ref 0.1–0.9)
Monocytes: 5 %
Neutrophils Absolute: 8.9 x10E3/uL — ABNORMAL HIGH (ref 1.4–7.0)
Neutrophils: 72 %
Platelets: 269 x10E3/uL (ref 150–450)
RBC: 4.82 x10E6/uL (ref 3.77–5.28)
RDW: 15.8 % — ABNORMAL HIGH (ref 11.7–15.4)
WBC: 12.3 x10E3/uL — ABNORMAL HIGH (ref 3.4–10.8)

## 2024-05-28 LAB — BASIC METABOLIC PANEL WITH GFR
BUN/Creatinine Ratio: 13 (ref 9–23)
BUN: 10 mg/dL (ref 6–20)
CO2: 23 mmol/L (ref 20–29)
Calcium: 8.5 mg/dL — AB (ref 8.7–10.2)
Chloride: 103 mmol/L (ref 96–106)
Creatinine, Ser: 0.77 mg/dL (ref 0.57–1.00)
Glucose: 87 mg/dL (ref 70–99)
Potassium: 3.7 mmol/L (ref 3.5–5.2)
Sodium: 140 mmol/L (ref 134–144)
eGFR: 105 mL/min/1.73 (ref >59)

## 2024-05-28 LAB — ANTIPHOSPHOLIPID SYNDROME EVAL, BLD
Anticardiolipin IgA: <9 U/mL (ref 0–11)
Anticardiolipin IgG: <9 GPL U/mL (ref 0–14)
Anticardiolipin IgM: 11 [MPL'U]/mL (ref 0–12)
DRVVT: 57.2 s — ABNORMAL HIGH (ref 0.0–47.0)
PTT Lupus Anticoagulant: 39.4 s (ref 0.0–43.5)
Phosphatydalserine, IgA: <1 U (ref 0–3)
Phosphatydalserine, IgG: 9 U (ref 0–20)
Phosphatydalserine, IgM: 11 U (ref 0–50)

## 2024-05-28 LAB — DRVVT MIX: dRVVT Mix: 43.1 s — ABNORMAL HIGH (ref 0.0–40.4)

## 2024-05-28 LAB — DRVVT CONFIRM: dRVVT Confirm: 1.2 ratio (ref 0.8–1.2)

## 2024-05-29 LAB — FACTOR 5 LEIDEN

## 2024-06-01 ENCOUNTER — Ambulatory Visit: Admitting: Internal Medicine

## 2024-06-02 ENCOUNTER — Ambulatory Visit: Admitting: Neurosurgery

## 2024-06-02 ENCOUNTER — Encounter: Payer: Self-pay | Admitting: Neurosurgery

## 2024-06-02 ENCOUNTER — Other Ambulatory Visit: Payer: Self-pay | Admitting: Cardiovascular Disease

## 2024-06-02 VITALS — BP 124/82 | HR 100 | Ht 67.0 in | Wt 199.0 lb

## 2024-06-02 DIAGNOSIS — Q2112 Patent foramen ovale: Secondary | ICD-10-CM

## 2024-06-02 DIAGNOSIS — G43009 Migraine without aura, not intractable, without status migrainosus: Secondary | ICD-10-CM

## 2024-06-02 DIAGNOSIS — H538 Other visual disturbances: Secondary | ICD-10-CM

## 2024-06-02 DIAGNOSIS — R0683 Snoring: Secondary | ICD-10-CM | POA: Diagnosis not present

## 2024-06-02 DIAGNOSIS — I639 Cerebral infarction, unspecified: Secondary | ICD-10-CM

## 2024-06-02 DIAGNOSIS — H9313 Tinnitus, bilateral: Secondary | ICD-10-CM | POA: Diagnosis not present

## 2024-06-02 DIAGNOSIS — G932 Benign intracranial hypertension: Secondary | ICD-10-CM

## 2024-06-02 DIAGNOSIS — G4733 Obstructive sleep apnea (adult) (pediatric): Secondary | ICD-10-CM

## 2024-06-02 NOTE — Progress Notes (Signed)
 Assessment : Discussed the use of AI scribe software for clinical note transcription with the patient, who gave verbal consent to proceed.  History of Present Illness Shannon Huerta is a 33 year old female with migraines and a history of stroke who presents for neurological evaluation. She was referred by her primary care provider for further evaluation of her migraines and neurological symptoms.  She has experienced migraines for approximately three years. Initially, triptans provided some relief, but they have since become ineffective. The headaches are exacerbated by head movements, such as bending over or sneezing, causing a 'boom, boom, boom' sensation. She also experiences numbness on one side during these episodes.  An MRI revealed a Chiari malformation. She consulted with a neurologist and a neurosurgeon, and underwent vestibular testing, which was unpleasant. Recently, she experienced a stroke characterized by an inability to move her hand, leading to an ER visit. During her ICU stay, a spinal tap was attempted but unsuccessful. A PFO was discovered, and she currently has a heart monitor in place.  She reports daily headaches, sometimes waking with them, describing them as similar to a hangover. The severity varies throughout the day. She has poor sleep quality, snores, and her husband reports worsening snoring. She has not been evaluated for sleep apnea but experiences leg movements at night. She must sleep by 8 or 9 PM to avoid waking with a headache, impacting her daily life, especially as she manages two children. Stress and fatigue trigger headaches or migraines.  She experiences constant tinnitus, more noticeable in the right ear, and occasionally hears a heartbeat. She has gained weight over the past few years.    Plan : Number it is hard to hear that she is suffering so much.  Firstly, I was able to confirm to her as has been done by other physicians, that she does not have a  Chiari malformation.  I listen to her story very carefully.  And I think that there are multiple things going on here: Firstly, I do suspect that she has a component of sleep problems which very well may be from obstructive sleep apnea.  I think she deserves a sleep study.  I explained to her that if she does have obstructive sleep apnea, it can contribute to headaches and very well may be worsening her current condition.  In addition, patients with obstructive sleep apnea can have a higher risk of strokes and heart attacks and she has already had a cerebral event which was attributed to the PFO which fortunately she is having closed.  Secondly, I think that she deserves a lumbar puncture.  With the ringing in her ears and the blurry vision, I think that she may have idiopathic intracranial hypertension in the setting of weight gain.  Lastly, I would like for her to be evaluated by ophthalmology to rule out or confirm papilledema.  She pledged to bring the report with her after she has seen them.  After the workup has been done we will make a plan on what to do next.   Social History   Socioeconomic History   Marital status: Married    Spouse name: Not on file   Number of children: Not on file   Years of education: Not on file   Highest education level: Not on file  Occupational History   Not on file  Tobacco Use   Smoking status: Never    Passive exposure: Current   Smokeless tobacco: Never  Vaping Use  Vaping status: Never Used  Substance and Sexual Activity   Alcohol use: Yes    Comment: SOCIALLY   Drug use: Never   Sexual activity: Yes    Birth control/protection: None  Other Topics Concern   Not on file  Social History Narrative   Not on file   Social Drivers of Health   Financial Resource Strain: Not on file  Food Insecurity: No Food Insecurity (05/23/2024)   Hunger Vital Sign    Worried About Running Out of Food in the Last Year: Never true    Ran Out of Food in the  Last Year: Never true  Transportation Needs: No Transportation Needs (05/23/2024)   PRAPARE - Administrator, Civil Service (Medical): No    Lack of Transportation (Non-Medical): No  Physical Activity: Not on file  Stress: Not on file  Social Connections: Not on file  Intimate Partner Violence: Not At Risk (05/23/2024)   Humiliation, Afraid, Rape, and Kick questionnaire    Fear of Current or Ex-Partner: No    Emotionally Abused: No    Physically Abused: No    Sexually Abused: No    Family History  Problem Relation Age of Onset   Lupus Mother    Asthma Mother    Urticaria Father    Lupus Maternal Uncle     No Known Allergies  Past Medical History:  Diagnosis Date   Allergy    Angio-edema    Angioedema 01/05/2020   Anxiety 2012   Asthma    Connective tissue disease    Recurrent urticaria 01/05/2020   Urticaria     History reviewed. No pertinent surgical history.   Physical Exam HENT:     Head: Normocephalic.     Nose: Nose normal.  Eyes:     Pupils: Pupils are equal, round, and reactive to light.  Cardiovascular:     Rate and Rhythm: Normal rate.  Pulmonary:     Effort: Pulmonary effort is normal.  Abdominal:     General: Abdomen is flat.  Musculoskeletal:     Cervical back: Normal range of motion.  Neurological:     Mental Status: She is alert.     Cranial Nerves: Cranial nerves 2-12 are intact.     Sensory: Sensation is intact.     Motor: Motor function is intact.     Coordination: Coordination is intact.        Results for orders placed or performed during the hospital encounter of 05/23/24  MR BRAIN WO CONTRAST   Narrative   EXAM: MRI BRAIN WITHOUT CONTRAST 05/24/2024 11:10:26 AM  TECHNIQUE: Multiplanar multisequence MRI of the head/brain was performed without the administration of intravenous contrast.  COMPARISON: CT of the head dated 05/23/2024.  CLINICAL HISTORY: Stroke, follow up.  FINDINGS:  BRAIN AND  VENTRICLES: No acute infarct. No intracranial hemorrhage. No mass. No midline shift. No hydrocephalus. The sella is unremarkable. Normal flow voids.  ORBITS: No acute abnormality.  SINUSES AND MASTOIDS: No acute abnormality.  BONES AND SOFT TISSUES: Normal marrow signal. No acute soft tissue abnormality.  IMPRESSION: 1. No acute intracranial abnormality.  Electronically signed by: Evalene Coho MD 05/24/2024 11:16 AM EDT RP Workstation: [id number]   Results for orders placed or performed during the hospital encounter of 06/29/23  MR Brain W Wo Contrast   Narrative   CLINICAL DATA:  Dizziness.  History of Chiari malformation.  EXAM: MRI HEAD WITHOUT AND WITH CONTRAST  TECHNIQUE: Multiplanar, multiecho pulse sequences of the brain  and surrounding structures were obtained without and with intravenous contrast.  CONTRAST:  7 cc Vueway   COMPARISON:  head CT 04/03/2023. Neck CT 03/26/2023. Brain MRI 03/01/2022.  FINDINGS: Brain: Again noted, the right cerebellar tonsil extends 4-5 mm through the foramen magnum, upper limits of normal. In most cases, this would not be of clinical relevance. Otherwise, the brain continues to have a normal appearance without evidence of stroke, mass, hemorrhage, hydrocephalus or extra-axial collection. After contrast administration, no abnormal enhancement occurs.  Vascular: Major vessels at the base of the brain show flow.  Skull and upper cervical spine: Negative  Sinuses/Orbits: Clear/normal  Other: None  IMPRESSION: 1. No acute or reversible finding. No change since 03/01/2022. 2. Again noted, the right cerebellar tonsil extends 4-5 mm through the foramen magnum, upper limits of normal. In most cases, this would not be of clinical relevance.   Electronically Signed   By: Oneil Officer M.D.   On: 06/29/2023 17:30   Results for orders placed or performed during the hospital encounter of 04/02/23  CT Head Wo Contrast    Narrative   CLINICAL DATA:  Headache  EXAM: CT HEAD WITHOUT CONTRAST  TECHNIQUE: Contiguous axial images were obtained from the base of the skull through the vertex without intravenous contrast.  RADIATION DOSE REDUCTION: This exam was performed according to the departmental dose-optimization program which includes automated exposure control, adjustment of the mA and/or kV according to patient size and/or use of iterative reconstruction technique.  COMPARISON:  None Available.  FINDINGS: Brain: There is no mass, hemorrhage or extra-axial collection. The size and configuration of the ventricles and extra-axial CSF spaces are normal. The brain parenchyma is normal, without acute or chronic infarction.  Vascular: No abnormal hyperdensity of the major intracranial arteries or dural venous sinuses. No intracranial atherosclerosis.  Skull: The visualized skull base, calvarium and extracranial soft tissues are normal.  Sinuses/Orbits: No fluid levels or advanced mucosal thickening of the visualized paranasal sinuses. No mastoid or middle ear effusion. The orbits are normal.  IMPRESSION: Normal head CT.   Electronically Signed   By: Franky Stanford M.D.   On: 04/03/2023 01:06

## 2024-06-08 ENCOUNTER — Encounter: Payer: Self-pay | Admitting: Internal Medicine

## 2024-06-08 ENCOUNTER — Encounter: Payer: Self-pay | Admitting: Cardiovascular Disease

## 2024-06-08 ENCOUNTER — Other Ambulatory Visit: Payer: Self-pay

## 2024-06-08 ENCOUNTER — Ambulatory Visit: Admitting: Internal Medicine

## 2024-06-08 VITALS — BP 128/84 | HR 86 | Temp 98.1°F | Ht 64.5 in | Wt 199.4 lb

## 2024-06-08 DIAGNOSIS — K219 Gastro-esophageal reflux disease without esophagitis: Secondary | ICD-10-CM | POA: Diagnosis not present

## 2024-06-08 DIAGNOSIS — L508 Other urticaria: Secondary | ICD-10-CM | POA: Diagnosis not present

## 2024-06-08 DIAGNOSIS — J3089 Other allergic rhinitis: Secondary | ICD-10-CM | POA: Diagnosis not present

## 2024-06-08 DIAGNOSIS — J4520 Mild intermittent asthma, uncomplicated: Secondary | ICD-10-CM | POA: Diagnosis not present

## 2024-06-08 MED ORDER — EPINEPHRINE 0.3 MG/0.3ML IJ SOAJ: 0.3000 mg | INTRAMUSCULAR | 2 refills | Status: AC | PRN

## 2024-06-08 MED ORDER — HYDROXYZINE HCL 25 MG PO TABS: 12.5000 mg | ORAL_TABLET | Freq: Every evening | ORAL | 0 refills | Status: DC | PRN

## 2024-06-08 MED ORDER — ALBUTEROL SULFATE HFA 108 (90 BASE) MCG/ACT IN AERS: 2.0000 | INHALATION_SPRAY | Freq: Four times a day (QID) | RESPIRATORY_TRACT | 2 refills | Status: AC | PRN

## 2024-06-08 MED ORDER — LEVOCETIRIZINE DIHYDROCHLORIDE 5 MG PO TABS: ORAL_TABLET | ORAL | 5 refills | Status: AC

## 2024-06-08 MED ORDER — FAMOTIDINE 20 MG PO TABS: 20.0000 mg | ORAL_TABLET | Freq: Two times a day (BID) | ORAL | 5 refills | Status: AC | PRN

## 2024-06-08 MED ORDER — MONTELUKAST SODIUM 10 MG PO TABS: 10.0000 mg | ORAL_TABLET | Freq: Every day | ORAL | 5 refills | Status: AC

## 2024-06-08 NOTE — Progress Notes (Signed)
 FOLLOW UP Date of Service/Encounter:   06/08/2024  Subjective:  Shannon Huerta (DOB: [birth year]) is a 33 y.o. female who [facility] in re-evaluation of the following: chronic hives, asthma, allergic rhinitis and reflux History obtained from: chart review and patient.  For Review, LV was on 06/03/23  with Dr.Edelyn Heidel seen for routine follow-up. See below for summary of history and diagnostics.   Therapeutic plans/changes recommended: not controlled on Xolair  300 q2 weeks, added tacrolimus  1 mg BID. Lost to follow-up ----------------------------------------------------- Pertinent History/Diagnostics:  Urticaria:  She has a history of very hard to control chronic urticaria.  She has been on high-dose Xolair  300 mg every 2 weeks since November 2023.  She has been on and off prednisone  for years.  She has 2 children at home both with autism. Cyclosporine  has been added in the past, but she did not remain on this for long.  The plan was for her to remain on Xolair  with cyclosporine  as this has shown effectiveness in several clinical studies.  She has tried and failed Plaquenil in the past but had to stop due to an adverse reaction affecting her teeth. 2021 lab work for hives showed an elevated CU index of 35, negative thyroid  antibodies, normal baseline tryptase of 10.1.  Negative alpha gal panel. 2023 ANA +1:320 speckled.  2024 double-stranded DNA negative, lupus inhibitor panel negative, beta-2 glycoprotein negative. Asthma Controlled on Advair 100, 1 puff daily. Reflux Current meds: Omeprazole  20 mg daily, famotidine  20 mg twice daily --------------------------------------------------- Today presents for follow-up. Discussed the use of AI scribe software for clinical note transcription with the patient, who gave verbal consent to proceed.  History of Present Illness Shannon Huerta is a 33 year old female with chronic hives who presents with recurrent  hives and associated anxiety.  Urticaria (chronic hives) - Recurrence of hives since late July after approximately four months without significant breakouts - Hives are intensely pruritic and burning - Daily breakouts despite ongoing therapy with Pepcid  and levocetirizine (two of each at night) - Symptoms exacerbated by stress and anxiety - Hives have been severe enough at times to impair movement of hands, face, or feet - No recent use of albuterol ; prednisone  use limited to hives, not for respiratory symptoms - Previously trialed Xolair  injections and hydroxyzine  with variable efficacy - previously tried tacrolimus  but unclear for how long as she was lost to follow-up  Anxiety and psychological symptoms - Significant anxiety associated with hives, including sensations similar to panic attacks - Stress and anxiety worsen urticaria symptoms - Nausea triggered by visual stimuli (e.g., seeing something dirty), attributed to heightened anxiety - Current use of Xanax  for anxiety management which seems to also help with hives  Medication intolerance and side effects - Avoiding prednisone  since Friday due to concerns about side effects, including increased anxiety and fatigue - Singulair  used during severe symptom flares  Hormonal influences on urticaria - Worsening of hives around menstrual cycle - History of IUD removal, with perceived improvement in hives following removal  Cardiac monitoring and related therapy - Currently on a heart monitor - Scheduled for transesophageal echocardiogram (TEE) on Thursday - Taking aspirin as part of cardiac treatment plan  Asthma controlled without need for prednisone  for breathing. No recent use of albuterol  inhaler. No ER/UC visits due to asthma.  Allergies controlled on levocetirizine and singulair .   Chart Review: Hospital admission 05/23/24: Patient was given TNK to treat potential stroke with resolution of  symptoms.  As her symptoms  developed suddenly after yawning, there was concern for possible PFO and TCD bubble study was performed.  This was positive, and patient will need to have outpatient TEE performed as well as cardiology follow-up for possible PFO closure.  Lower extremity ultrasound was negative for DVT.   All medications reviewed by clinical staff and updated in chart. No new pertinent medical or surgical history except as noted in HPI.  ROS: All others negative except as noted per HPI.   Objective:  BP 128/84 (BP Location: Right Arm, Patient Position: Sitting, Cuff Size: Normal)   Pulse 86   Temp 98.1 F (36.7 C) (Temporal)   Ht 5' 4.5 (1.638 m)   Wt 199 lb 6.4 oz (90.4 kg)   LMP 05/19/2024   SpO2 97%   BMI 33.70 kg/m  Body mass index is 33.7 kg/m. Physical Exam: General Appearance:  Alert, cooperative, no distress, appears stated age  Head:  Normocephalic, without obvious abnormality, atraumatic  Eyes:  Conjunctiva clear, EOM's intact  Ears EACs normal bilaterally and normal TMs bilaterally  Nose: Nares normal, normal mucosa and no visible anterior polyps  Throat: Lips, tongue normal; teeth and gums normal, normal posterior oropharynx  Neck: Supple, symmetrical  Lungs:   , Respirations unlabored, no coughing  Heart:  , Appears well perfused  Extremities: No edema  Skin: Skin color, texture, turgor normal and no rashes or lesions on visualized portions of skin  Neurologic: No gross deficits   Labs:  Lab Orders  No laboratory test(s) ordered today    Assessment/Plan   Hives-not at goal Use the least amount of medications possible while remaining hive free Levocetirizine (Xyzal ) 5 mg twice a day and famotidine  (Pepcid ) 20 mg twice a day. If no symptoms for 7-14 days then decrease to. Levocetirizine (Xyzal ) 5 mg twice a day and famotidine  (Pepcid ) 20 mg once a day.  If no symptoms for 7-14 days then decrease to. Levocetirizine (Xyzal ) 5 mg twice a day.  If no symptoms for 7-14 days then  decrease to. Levocetirizine (Xyzal ) 5 mg once a day.  Continue montelukast  10 mg once a day Continue hydroxyzine  25 mg once a day at bedtime as needed for itch Restart Xolair  300 mg once every 4 weeks and have access to an epinephrine  autoinjector set Consider Rhapsido if not controlled on Xolair . Not an option while on blood thinners as risk of bleeding is a rare side effect.  Angioedema- not at goal Associated angioedema occurs in up to 50% of patients with chronic urticaria.  Continue with the treatment plan as listed above  Asthma-at goal Continue singulair  10 mg daily as needed Continue albuterol  2 puffs once every 4 hours as needed for cough or wheeze You may use albuterol  2 puffs 5 to 15 minutes before activity to decrease cough or wheeze  Allergic rhinitis-at goal Continue allergen avoidance measures directed toward grass pollen, mold, and dust mite as listed below  Continue Xyzal  5 mg once a day as needed for runny nose or itch Continue azelastine  nasal spray 1 to 2 sprays per nostril 2 times a day if needed Consider saline nasal rinses as needed for nasal symptoms. Use this before any medicated nasal sprays for best result  Reflux-at goal Continue dietary lifestyle modifications as listed below Continue pepcid  20 - 40 mg nightly.  Call the clinic if this treatment plan is not working well for you.  Follow up : 3 months, sooner if needed It was a  pleasure seeing you again in clinic today! Thank you for allowing me to participate in your care.  Rocky Endow, MD [facility]  Other: none  Rocky Endow, MD  [facility] 

## 2024-06-08 NOTE — Patient Instructions (Signed)
 Hives Use the least amount of medications possible while remaining hive free Levocetirizine (Xyzal ) 5 mg twice a day and famotidine  (Pepcid ) 20 mg twice a day. If no symptoms for 7-14 days then decrease to. Levocetirizine (Xyzal ) 5 mg twice a day and famotidine  (Pepcid ) 20 mg once a day.  If no symptoms for 7-14 days then decrease to. Levocetirizine (Xyzal ) 5 mg twice a day.  If no symptoms for 7-14 days then decrease to. Levocetirizine (Xyzal ) 5 mg once a day.  Continue montelukast  10 mg once a day Continue hydroxyzine  25 mg once a day at bedtime as needed for itch Restart Xolair  300 mg once every 4 weeks and have access to an epinephrine  autoinjector set Consider Rhapsido if not controlled on Xolair .  Angioedema Associated angioedema occurs in up to 50% of patients with chronic urticaria.  Continue with the treatment plan as listed above  Asthma Continue singulair  10 mg daily as needed Continue albuterol  2 puffs once every 4 hours as needed for cough or wheeze You may use albuterol  2 puffs 5 to 15 minutes before activity to decrease cough or wheeze  Allergic rhinitis Continue allergen avoidance measures directed toward grass pollen, mold, and dust mite as listed below  Continue Xyzal  5 mg once a day as needed for runny nose or itch Continue azelastine  nasal spray 1 to 2 sprays per nostril 2 times a day if needed Consider saline nasal rinses as needed for nasal symptoms. Use this before any medicated nasal sprays for best result  Reflux Continue dietary lifestyle modifications as listed below Continue pepcid  20 - 40 mg nightly.  Call the clinic if this treatment plan is not working well for you.  Follow up : 3 months, sooner if needed It was a pleasure seeing you again in clinic today! Thank you for allowing me to participate in your care.  Rocky Endow, MD [facility]   Reducing Pollen Exposure The American Academy of Allergy, Asthma and Immunology  suggests the following steps to reduce your exposure to pollen during allergy seasons. Do not hang sheets or clothing out to dry; pollen may collect on these items. Do not mow lawns or spend time around freshly cut grass; mowing stirs up pollen. Keep windows closed at night.  Keep car windows closed while driving. Minimize morning activities outdoors, a time when pollen counts are usually at their highest. Stay indoors as much as possible when pollen counts or humidity is high and on windy days when pollen tends to remain in the air longer. Use air conditioning when possible.  Many air conditioners have filters that trap the pollen spores. Use a HEPA room air filter to remove pollen form the indoor air you breathe.  Control of Mold Allergen Mold and fungi can grow on a variety of surfaces provided certain temperature and moisture conditions exist.  Outdoor molds grow on plants, decaying vegetation and soil.  The major outdoor mold, Alternaria and Cladosporium, are found in very high numbers during hot and dry conditions.  Generally, a late Summer - Fall peak is seen for common outdoor fungal spores.  Rain will temporarily lower outdoor mold spore count, but counts rise rapidly when the rainy period ends.  The most important indoor molds are Aspergillus and Penicillium.  Dark, humid and poorly ventilated basements are ideal sites for mold growth.  The next most common sites of mold growth are the bathroom and the kitchen.  Outdoor [employer] Use air conditioning and keep  windows closed Avoid exposure to decaying vegetation. Avoid leaf raking. Avoid grain handling. Consider wearing a face mask if working in moldy areas.  Indoor Mold Control Maintain humidity below 50%. Clean washable surfaces with 5% bleach solution. Remove sources e.g. Contaminated carpets.   Control of Dust Mite Allergen Dust mites play a major role in allergic asthma and rhinitis. They occur in environments with high  humidity wherever human skin is found. Dust mites absorb humidity from the atmosphere (ie, they do not drink) and feed on organic matter (including shed human and animal skin). Dust mites are a microscopic type of insect that you cannot see with the naked eye. High levels of dust mites have been detected from mattresses, pillows, carpets, upholstered furniture, bed covers, clothes, soft toys and any woven material. The principal allergen of the dust mite is found in its feces. A gram of dust may contain 1,000 mites and 250,000 fecal particles. Mite antigen is easily measured in the air during house cleaning activities. Dust mites do not bite and do not cause harm to humans, other than by triggering allergies/asthma.  Ways to decrease your exposure to dust mites in your home:  1. Encase mattresses, box springs and pillows with a mite-impermeable barrier or cover  2. Wash sheets, blankets and drapes weekly in hot water (130 F) with detergent and dry them in a dryer on the hot setting.  3. Have the room cleaned frequently with a vacuum cleaner and a damp dust-mop. For carpeting or rugs, vacuuming with a vacuum cleaner equipped with a high-efficiency particulate air (HEPA) filter. The dust mite allergic individual should not be in a room which is being cleaned and should wait 1 hour after cleaning before going into the room.  4. Do not sleep on upholstered furniture (eg, couches).  5. If possible removing carpeting, upholstered furniture and drapery from the home is ideal. Horizontal blinds should be eliminated in the rooms where the person spends the most time (bedroom, study, television room). Washable vinyl, roller-type shades are optimal.  6. Remove all non-washable stuffed toys from the bedroom. Wash stuffed toys weekly like sheets and blankets above.  7. Reduce indoor humidity to less than 50%. Inexpensive humidity monitors can be purchased at most hardware stores. Do not use a humidifier as can  make the problem worse and are not recommended.

## 2024-06-09 ENCOUNTER — Telehealth: Payer: Self-pay | Admitting: *Deleted

## 2024-06-09 MED ORDER — XOLAIR 300 MG/2ML ~~LOC~~ SOSY: 300.0000 mg | PREFILLED_SYRINGE | SUBCUTANEOUS | 3 refills | Status: AC

## 2024-06-09 NOTE — Telephone Encounter (Signed)
 Called patient and advised approval and submit to [facility] for Xolair  restart. Advised will reach out once delivery set to make appt to start therapy with one hour wait for initial dsoe ehtne next 2 injs in clinic and can then got to home admin

## 2024-06-09 NOTE — Telephone Encounter (Signed)
-----   Message from Rocky LOISE Endow sent at 06/08/2024 12:15 PM EST ----- Can we resubmit for Xolair  300 q4 weeks for hives?

## 2024-06-11 NOTE — Progress Notes (Signed)
 Spoke to patient and instructed them to come at 1045  and to be NPO after 0000.     Confirmed that patient will have a ride home and someone to stay with them for 24 hours after the procedure.   Medications reviewed.

## 2024-06-12 ENCOUNTER — Other Ambulatory Visit: Payer: Self-pay

## 2024-06-12 ENCOUNTER — Encounter (HOSPITAL_COMMUNITY)
Admission: RE | Disposition: A | Discharge status: Home / Self Care | Payer: Self-pay | Source: Home / Self Care | Attending: Internal Medicine

## 2024-06-12 ENCOUNTER — Ambulatory Visit (HOSPITAL_COMMUNITY): Admitting: Anesthesiology

## 2024-06-12 ENCOUNTER — Ambulatory Visit (HOSPITAL_COMMUNITY)

## 2024-06-12 ENCOUNTER — Ambulatory Visit (HOSPITAL_COMMUNITY)
Admission: RE | Admit: 2024-06-12 | Discharge: 2024-06-12 | Disposition: A | Discharge status: Home / Self Care | Attending: Internal Medicine | Admitting: Internal Medicine

## 2024-06-12 DIAGNOSIS — K219 Gastro-esophageal reflux disease without esophagitis: Secondary | ICD-10-CM | POA: Diagnosis not present

## 2024-06-12 DIAGNOSIS — J45909 Unspecified asthma, uncomplicated: Secondary | ICD-10-CM | POA: Insufficient documentation

## 2024-06-12 DIAGNOSIS — F419 Anxiety disorder, unspecified: Secondary | ICD-10-CM | POA: Diagnosis not present

## 2024-06-12 DIAGNOSIS — Q2112 Patent foramen ovale: Secondary | ICD-10-CM

## 2024-06-12 DIAGNOSIS — Z8673 Personal history of transient ischemic attack (TIA), and cerebral infarction without residual deficits: Secondary | ICD-10-CM | POA: Insufficient documentation

## 2024-06-12 DIAGNOSIS — I639 Cerebral infarction, unspecified: Secondary | ICD-10-CM

## 2024-06-12 DIAGNOSIS — E785 Hyperlipidemia, unspecified: Secondary | ICD-10-CM | POA: Insufficient documentation

## 2024-06-12 DIAGNOSIS — Z79899 Other long term (current) drug therapy: Secondary | ICD-10-CM | POA: Diagnosis not present

## 2024-06-12 HISTORY — PX: TRANSESOPHAGEAL ECHOCARDIOGRAM (CATH LAB): EP1270

## 2024-06-12 SURGERY — TRANSESOPHAGEAL ECHOCARDIOGRAM (TEE) (CATHLAB)
Anesthesia: Monitor Anesthesia Care

## 2024-06-12 MED ORDER — FENTANYL CITRATE (PF) 100 MCG/2ML IJ SOLN
INTRAMUSCULAR | Status: AC
  Filled 2024-06-12: qty 2

## 2024-06-12 MED ORDER — FENTANYL CITRATE (PF) 100 MCG/2ML IJ SOLN
INTRAMUSCULAR | Status: DC | PRN
  Administered 2024-06-12: 25 ug via INTRAVENOUS
  Administered 2024-06-12: 50 ug via INTRAVENOUS
  Administered 2024-06-12: 25 ug via INTRAVENOUS

## 2024-06-12 MED ORDER — PROPOFOL 10 MG/ML IV BOLUS
INTRAVENOUS | Status: DC | PRN
  Administered 2024-06-12 (×2): 20 mg via INTRAVENOUS
  Administered 2024-06-12: 120 ug/kg/min via INTRAVENOUS

## 2024-06-12 MED ORDER — SODIUM CHLORIDE 0.9 % IV SOLN: INTRAVENOUS | Status: DC

## 2024-06-12 NOTE — Anesthesia Preprocedure Evaluation (Signed)
 Anesthesia Evaluation  Patient identified by MRN, date of birth, ID band Patient awake    Reviewed: Allergy & Precautions, NPO status , Patient's Chart, lab work & pertinent test results  Airway Mallampati: II  TM Distance: >3 FB Neck ROM: Full    Dental  (+) Teeth Intact, Dental Advisory Given   Pulmonary asthma    Pulmonary exam normal breath sounds clear to auscultation       Cardiovascular negative cardio ROS Normal cardiovascular exam Rhythm:Regular Rate:Normal     Neuro/Psych  PSYCHIATRIC DISORDERS Anxiety     CVA    GI/Hepatic Neg liver ROS,GERD  Medicated,,  Endo/Other  Obesity   Renal/GU negative Renal ROS     Musculoskeletal negative musculoskeletal ROS (+)    Abdominal   Peds  Hematology negative hematology ROS (+)   Anesthesia Other Findings Day of surgery medications reviewed with the patient.  Reproductive/Obstetrics                              Anesthesia Physical Anesthesia Plan  ASA: 3  Anesthesia Plan: MAC   Post-op Pain Management:    Induction: Intravenous  PONV Risk Score and Plan: 2 and TIVA and Treatment may vary due to age or medical condition  Airway Management Planned: Natural Airway and Simple Face Mask  Additional Equipment:   Intra-op Plan:   Post-operative Plan:   Informed Consent: I have reviewed the patients History and Physical, chart, labs and discussed the procedure including the risks, benefits and alternatives for the proposed anesthesia with the patient or authorized representative who has indicated his/her understanding and acceptance.     Dental advisory given  Plan Discussed with: CRNA and Anesthesiologist  Anesthesia Plan Comments:         Anesthesia Quick Evaluation

## 2024-06-12 NOTE — Discharge Instructions (Signed)

## 2024-06-12 NOTE — CV Procedure (Signed)
 INDICATIONS: PFO  PROCEDURE:   Informed consent was obtained prior to the procedure. The risks, benefits and alternatives for the procedure were discussed and the patient comprehended these risks.  Risks include, but are not limited to, cough, sore throat, vomiting, nausea, somnolence, esophageal and stomach trauma or perforation, bleeding, low blood pressure, aspiration, pneumonia, infection, trauma to the teeth and death.    Procedural time out performed.  During this procedure the patient was administered propofol per anesthesia.  The patient's heart rate, blood pressure, and oxygen saturation were monitored continuously during the procedure. The period of conscious sedation was 20 minutes, of which I was present face-to-face 100% of this time.  The transesophageal probe was inserted in the esophagus and stomach without difficulty and multiple views were obtained.  The patient was kept under observation until the patient left the procedure room.  The patient left the procedure room in stable condition.   Agitated microbubble saline contrast was administered.  COMPLICATIONS:    There were no immediate complications.  FINDINGS:   FORMAL ECHOCARDIOGRAM REPORT PENDING Small Pfo with bidirectional shunt by agitated saline.  RECOMMENDATIONS:     D/c when alert  Time Spent Directly with the Patient:  45 minutes   Shannon Huerta A Ashle Stief 06/12/2024, 1:52 PM

## 2024-06-12 NOTE — Transfer of Care (Signed)
 Immediate Anesthesia Transfer of Care Note  Patient: Shannon Huerta  Procedure(s) Performed: TRANSESOPHAGEAL ECHOCARDIOGRAM  Patient Location: PACU and Cath Lab  Anesthesia Type:MAC  Level of Consciousness: drowsy  Airway & Oxygen Therapy: Patient Spontanous Breathing  Post-op Assessment: Report given to RN  Post vital signs: stable  Last Vitals:  Vitals Value Taken Time  BP    Temp    Pulse    Resp    SpO2      Last Pain:  Vitals:   06/12/24 1113  TempSrc:   PainSc: 0-No pain         Complications: No notable events documented.

## 2024-06-12 NOTE — Progress Notes (Signed)
 Echocardiogram Echocardiogram Transesophageal has been performed.  Shannon Huerta 06/12/2024, 1:56 PM

## 2024-06-12 NOTE — Interval H&P Note (Signed)
 History and Physical Interval Note:  06/12/2024 12:50 PM  Shannon Huerta  has presented today for surgery, with the diagnosis of CVA Stroke.  The various methods of treatment have been discussed with the patient and family. After consideration of risks, benefits and other options for treatment, the patient has consented to  Procedure(s): TRANSESOPHAGEAL ECHOCARDIOGRAM (N/A) as a surgical intervention.  The patient's history has been reviewed, patient examined, no change in status, stable for surgery.  I have reviewed the patient's chart and labs.  Questions were answered to the patient's satisfaction.     Maddux First A Shannon Huerta

## 2024-06-13 ENCOUNTER — Encounter: Payer: Self-pay | Admitting: Family Medicine

## 2024-06-14 ENCOUNTER — Encounter (HOSPITAL_COMMUNITY): Payer: Self-pay | Admitting: Internal Medicine

## 2024-06-15 NOTE — Anesthesia Postprocedure Evaluation (Signed)
 Anesthesia Post Note  Patient: Shannon Huerta  Procedure(s) Performed: TRANSESOPHAGEAL ECHOCARDIOGRAM     Patient location during evaluation: Cath Lab Anesthesia Type: MAC Level of consciousness: awake and alert Pain management: pain level controlled Vital Signs Assessment: post-procedure vital signs reviewed and stable Respiratory status: spontaneous breathing, nonlabored ventilation and respiratory function stable Cardiovascular status: stable and blood pressure returned to baseline Postop Assessment: no apparent nausea or vomiting Anesthetic complications: no   No notable events documented.  Last Vitals:  Vitals:   06/12/24 1356 06/12/24 1406  BP: 116/89 119/87  Pulse: 91 80  Resp: 19 16  Temp:    SpO2: 96% 97%    Last Pain:  Vitals:   06/12/24 1406  TempSrc:   PainSc: 0-No pain                 [name]

## 2024-06-16 NOTE — Discharge Instructions (Signed)

## 2024-06-17 ENCOUNTER — Ambulatory Visit
Admission: RE | Admit: 2024-06-17 | Discharge: 2024-06-17 | Disposition: A | Discharge status: Home / Self Care | Source: Ambulatory Visit | Attending: Neurosurgery | Admitting: Neurosurgery

## 2024-06-17 DIAGNOSIS — G932 Benign intracranial hypertension: Secondary | ICD-10-CM

## 2024-06-18 ENCOUNTER — Telehealth: Payer: Self-pay

## 2024-06-18 LAB — ECHO TEE

## 2024-06-18 NOTE — Telephone Encounter (Signed)
 SABRA

## 2024-06-18 NOTE — Progress Notes (Unsigned)
 Cardiology Office Note:   Date:  06/19/2024  ID:  Shannon Huerta, DOB 11/19/1990, MRN [id number] PCP:  Towana Small, FNP  [facility] Providers Cardiologist:  Wendel Haws, MD Referring MD: Towana Small, FNP  Chief Complaint/Reason for Referral:  CVA/PFO ASSESSMENT:    1. Cerebrovascular accident (CVA), unspecified mechanism (HCC)   2. PFO (patent foramen ovale)   3. Hyperlipidemia LDL goal <55     PLAN:   In order of problems listed above: CVA: Patient has an elevated Rope score of 8 and Pascal classification possible implicating her PFO with her stroke.  I will have the patient follow-up in a month when her monitor is completed.  I think this is important given her report of palpitations.  It would be unusual but not completely unheard of for young person to harbor  atrial fibrillation.  The patient has neurology follow-up in early December.  Continue aspirin 81 mg. PFO: See discussion above. Hyperlipidemia: Continue atorvastatin 80 mg.  Goal LDL now is less than 55 given history of stroke.            Dispo:  Return in about 4 weeks (around 07/17/2024).       I spent 42 minutes reviewing all clinical data during and prior to this visit including all relevant imaging studies, laboratories, clinical information from other health systems and prior notes from both Cardiology and other specialties, interviewing the patient, conducting a complete physical examination, and coordinating care in order to formulate a comprehensive and personalized evaluation and treatment plan.   History of Present Illness:    FOCUSED PROBLEM LIST:   CVA >> headache/R arm weakness>>TNK October 2025 Code Stroke CT head No acute abnormality.    CTA head & neck No LVO, MRI No acute intracranial abnormality.  MRV Unremarkable MRV of the head.  2D Echo EF 55 to 60%, normal left atrial size, cannot exclude small PFO Bilateral lower extremity negative TCD bubble study Spencer grade 3 TEE small  PFO 30d monitor in place; ongoing ROPE score 8/PASCAL possible Hyperlipidemia Migraines BMI 32  November 2025:  Patient consents to use of AI scribe. The patient is a 33 year old female with above listed medical problems referred for recommendations regarding PFO in the context of a recent stroke.  The patient was in her normal state of health up until mid October when she developed a headache and right arm weakness.  She is normally quite an active person.  She presented to [facility] and her workup is summarized above.  She underwent TNK administration with full resolution of her symptoms.  A TCD was positive for PFO as well and the TEE demonstrated a small PFO.  She was seen by Dr. Court and a 30-day monitor was placed.  The results of this study are still pending.  She is here for further recommendations.  She experiences palpitations 'pretty much all day' and finds it challenging to record each episode as instructed, opting to document only the most noticeable ones.  In the days following the stroke, she experienced tingling in her arms and legs, which lasted for a while but was not as intense as the initial symptoms. She reports worsening shortness of breath since the stroke, feeling easily fatigued, and experiencing light headaches with movement. No chest pain or leg swelling.  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 She does not smoke. She has a history of syncope, described as random fainting episodes, often occurring when she felt hot.  She also inquired about the association between PFO and migraines.     Current Medications: No outpatient medications have been marked as taking for the 06/19/24 encounter (Office Visit) with Beonca Gibb K, MD.   Current Facility-Administered Medications for the 06/19/24 encounter (Office Visit) with Elener Custodio K, MD  Medication   omalizumab  (XOLAIR ) injection 300 mg     Review of Systems:   Please see  the history of present illness.    All other systems reviewed and are negative.     EKGs/Labs/Other Test Reviewed:   EKG: 2025 sinus rhythm  EKG Interpretation Date/Time:    Ventricular Rate:    PR Interval:    QRS Duration:    QT Interval:    QTC Calculation:   R Axis:      Text Interpretation:          CARDIAC STUDIES: Refer to CV Procedures and Imaging Tabs   Risk Assessment/Calculations:          Physical Exam:   VS:  BP 133/85 (BP Location: Left Arm, Patient Position: Sitting)   Pulse 98   Ht 5' 6 (1.676 m)   Wt 202 lb (91.6 kg)   LMP 05/19/2024   SpO2 97%   BMI 32.60 kg/m        Wt Readings from Last 3 Encounters:  06/19/24 202 lb (91.6 kg)  06/12/24 198 lb (89.8 kg)  06/08/24 199 lb 6.4 oz (90.4 kg)      GENERAL:  No apparent distress, AOx3 HEENT:  No carotid bruits, +2 carotid impulses, no scleral icterus CAR: RRR no murmurs, gallops, rubs, or thrills RES:  Clear to auscultation bilaterally ABD:  Soft, nontender, nondistended, positive bowel sounds x 4 VASC:  +2 radial pulses, +2 carotid pulses NEURO:  CN 2-12 grossly intact; motor and sensory grossly intact PSYCH:  No active depression or anxiety EXT:  No edema, ecchymosis, or cyanosis  Signed, Lurena MARLA Red, MD  06/19/2024 9:03 AM    [facility] [address] [place], [place], [place]  [address] Phone: [phone]; Fax: [phone]   Note:  This document was prepared using Dragon voice recognition software and may include unintentional dictation errors.

## 2024-06-19 ENCOUNTER — Ambulatory Visit: Attending: Internal Medicine | Admitting: Internal Medicine

## 2024-06-19 ENCOUNTER — Encounter: Payer: Self-pay | Admitting: Internal Medicine

## 2024-06-19 VITALS — BP 133/85 | HR 98 | Ht 66.0 in | Wt 202.0 lb

## 2024-06-19 DIAGNOSIS — I639 Cerebral infarction, unspecified: Secondary | ICD-10-CM

## 2024-06-19 DIAGNOSIS — Q2112 Patent foramen ovale: Secondary | ICD-10-CM

## 2024-06-19 DIAGNOSIS — E785 Hyperlipidemia, unspecified: Secondary | ICD-10-CM

## 2024-06-19 NOTE — Patient Instructions (Addendum)
 Medication Instructions:  Your physician recommends that you continue on your current medications as directed. Please refer to the Current Medication list given to you today.  *If you need a refill on your cardiac medications before your next appointment, please call your pharmacy*  Lab Work: None ordered.  You may go to any [facility] Location for your lab work:  [facility] - [address] ([facility]) - [address] [phone] [address]  [place] - [address]   [place]  - [address]   [place] - [address] - [address] [employer]  - [address] [place] - [address] ([facility]   If you have labs (blood work) drawn today and your tests are completely normal, you will receive your results only by: [employer] (if you have MyChart)  If you have any lab test that is abnormal or we need to change your treatment, we will call you or send a MyChart message to review the results.  Testing/Procedures: None ordered.  Follow-Up: At [facility], you and your health needs are our priority.  As part of our continuing mission to provide you with exceptional heart care, we have created designated Provider Care Teams.  These Care Teams include your primary Cardiologist (physician) and Advanced Practice Providers (APPs -  Physician Assistants and Nurse Practitioners) who all work together to provide you with the care you need, when you need it.  Your next appointment:   As scheduled

## 2024-06-22 ENCOUNTER — Encounter: Payer: Self-pay | Admitting: Cardiovascular Disease

## 2024-06-29 ENCOUNTER — Ambulatory Visit

## 2024-07-06 ENCOUNTER — Institutional Professional Consult (permissible substitution): Admitting: Neurology

## 2024-07-06 ENCOUNTER — Encounter: Payer: Self-pay | Admitting: Neurology

## 2024-07-07 ENCOUNTER — Ambulatory Visit: Admitting: Family Medicine

## 2024-07-07 ENCOUNTER — Ambulatory Visit: Admitting: Neurosurgery

## 2024-07-07 ENCOUNTER — Ambulatory Visit: Attending: Cardiovascular Disease

## 2024-07-07 DIAGNOSIS — R002 Palpitations: Secondary | ICD-10-CM

## 2024-07-10 DIAGNOSIS — R002 Palpitations: Secondary | ICD-10-CM

## 2024-07-13 ENCOUNTER — Telehealth: Admitting: Family Medicine

## 2024-07-13 ENCOUNTER — Ambulatory Visit: Admitting: Family Medicine

## 2024-07-13 DIAGNOSIS — L508 Other urticaria: Secondary | ICD-10-CM

## 2024-07-13 DIAGNOSIS — F410 Panic disorder [episodic paroxysmal anxiety] without agoraphobia: Secondary | ICD-10-CM

## 2024-07-13 MED ORDER — ALPRAZOLAM 0.25 MG PO TABS: 0.5000 mg | ORAL_TABLET | Freq: Two times a day (BID) | ORAL | 0 refills | Status: AC | PRN

## 2024-07-13 MED ORDER — PREDNISONE 10 MG PO TABS: ORAL_TABLET | ORAL | 0 refills | Status: DC

## 2024-07-13 MED ORDER — CLONAZEPAM 0.5 MG PO TABS: 0.5000 mg | ORAL_TABLET | Freq: Every evening | ORAL | 0 refills | Status: AC | PRN

## 2024-07-13 NOTE — Progress Notes (Unsigned)
 Virtual Visit via Video Note  I connected with Shannon Huerta on 07/13/24 at 1:34 PM by a video enabled telemedicine application and verified that I am speaking with the correct person using two identifiers.  Patient Location: Home Provider Location: Office/Clinic  I discussed the limitations, risks, security, and privacy concerns of performing an evaluation and management service by video and the availability of in person appointments. I also discussed with the patient that there may be a patient responsible charge related to this service. The patient expressed understanding and agreed to proceed.  Subjective: PCP: Towana Small, FNP  CC: Mood Follow-Up  ANXIETY: Shannon Huerta presents for the medical management of anxiety.  Current medication regimen:  Counseling:  Well controlled:  Denies SI/HI.   Xanax  0.25mg  BID during the day  Klonopin  at night most daily  Have not had to take it as much lately       07/13/2024    1:33 PM 05/26/2024   10:24 AM 12/12/2023    9:13 AM 11/13/2023   10:11 AM  GAD 7 : Generalized Anxiety Score  Nervous, Anxious, on Edge 2 1 2 3   Control/stop worrying 1 2 3 3   Worry too much - different things 3 2 3 3   Trouble relaxing 3 2 3 3   Restless 3 3 2 3   Easily annoyed or irritable 3 3 2 3   Afraid - awful might happen 2 0 1 2  Total GAD 7 Score 17 13 16 20   Anxiety Difficulty Somewhat difficult Not difficult at all Very difficult Extremely difficult      07/13/2024    1:31 PM 12/12/2023    9:12 AM 11/13/2023   10:11 AM  PHQ9 SCORE ONLY  PHQ-9 Total Score 10 11  20       Data saved with a previous flowsheet row definition   ROS: Per HPI  Current Outpatient Medications:    albuterol  (VENTOLIN  HFA) 108 (90 Base) MCG/ACT inhaler, Inhale 2 puffs into the lungs every 6 (six) hours as needed for wheezing or shortness of breath., Disp: 8 g, Rfl: 2   ALPRAZolam  (XANAX ) 0.25 MG tablet, Take 2 tablets (0.5 mg total) by mouth 2 (two) times daily as  needed for anxiety., Disp: 30 tablet, Rfl: 1   amitriptyline  (ELAVIL ) 25 MG tablet, Take 25 mg by mouth at bedtime as needed for sleep., Disp: , Rfl:    atorvastatin  (LIPITOR ) 80 MG tablet, Take 1 tablet (80 mg total) by mouth at bedtime., Disp: 30 tablet, Rfl: 2   clonazePAM  (KLONOPIN ) 0.5 MG tablet, Take 0.5 mg by mouth 2 (two) times daily as needed for anxiety., Disp: , Rfl:    EPINEPHrine  0.3 mg/0.3 mL IJ SOAJ injection, Inject 0.3 mg into the muscle as needed for anaphylaxis., Disp: 1 each, Rfl: 2   famotidine  (PEPCID ) 20 MG tablet, Take 1 tablet (20 mg total) by mouth 2 (two) times daily as needed for heartburn or indigestion., Disp: 60 tablet, Rfl: 5   hydrocortisone  cream 1 %, Apply topically 4 (four) times daily as needed for itching., Disp: 30 g, Rfl: 0   hydrOXYzine  (ATARAX ) 25 MG tablet, Take 0.5-1 tablets (12.5-25 mg total) by mouth at bedtime as needed for itching., Disp: 30 tablet, Rfl: 0   levocetirizine (XYZAL ) 5 MG tablet, Take 1 tablet daily, can increase to 2 tablets twice daily as needed for hives.  This is maximum dose.  Do not take with other antihistamines such as hydroxyzine ., Disp: 120 tablet, Rfl: 5  montelukast  (SINGULAIR ) 10 MG tablet, Take 1 tablet (10 mg total) by mouth at bedtime., Disp: 30 tablet, Rfl: 5   omalizumab  (XOLAIR ) 300 MG/2  ML prefilled syringe, Inject 300 mg into the skin every 28 (twenty-eight) days., Disp: 6 mL, Rfl: 3   aspirin  EC 81 MG tablet, Take 1 tablet (81 mg total) by mouth daily. Swallow whole., Disp: 30 tablet, Rfl: 12   predniSONE  (DELTASONE ) 10 MG tablet, Take 10 mg prednisone  to take up to twice daily as needed for hives. (Patient not taking: Reported on 07/13/2024), Disp: 20 tablet, Rfl: 0  Current Facility-Administered Medications:    omalizumab  (XOLAIR ) injection 300 mg, 300 mg, Subcutaneous, Q14 Days, Marinda Rocky SAILOR, MD, 300 mg at 05/03/23 1430  Observations/Objective: There were no vitals filed for this visit.  General: Alert and  oriented x 4. Speaking in clear and full sentences, no audible heavy breathing, no acute distress.  Sounds alert and appropriately interactive.  Appears well.  Face symmetric.  Extraocular movements intact.  Pupils equal and round.  No nasal flaring or accessory muscle use visualized.  Assessment and Plan: ***  Follow Up Instructions: No follow-ups on file.   I discussed the assessment and treatment plan with the patient. The patient was provided an opportunity to ask questions, and all were answered. The patient agreed with the plan and demonstrated an understanding of the instructions.   The patient was advised to call back or seek an in-person evaluation if the symptoms worsen or if the condition fails to improve as anticipated.  The above assessment and management plan was discussed with the patient. The patient verbalized understanding of and has agreed to the management plan.   Evalene Arts, FNP

## 2024-07-14 ENCOUNTER — Ambulatory Visit

## 2024-07-16 NOTE — Progress Notes (Unsigned)
 Cardiology Office Note:   Date:  07/22/2024  ID:  Shannon Huerta, DOB 09/12/1990, MRN [id number] PCP:  Towana Small, FNP  [facility] Providers Cardiologist:  Wendel Haws, MD Referring MD: Towana Small, FNP  Chief Complaint/Reason for Referral:  CVA/PFO ASSESSMENT:    1. Cerebrovascular accident (CVA), unspecified mechanism (HCC)   2. PFO (patent foramen ovale)   3. Hyperlipidemia LDL goal <55   4. Pre-procedure lab exam      PLAN:   In order of problems listed above: CVA: Patient has an elevated Rope score of 8 and Pascal classification possible implicating her PFO with her stroke.  Her monitor demonstrated no atrial fibrillation.  Continue aspirin  81 mg.  Will refer for elective PFO closure to decrease patient's risk of recurrent stroke.  Will follow-up neurology and neurosurgical visits that are upcoming. PFO: See discussion above. Hyperlipidemia: Continue atorvastatin  80 mg.  Goal LDL now is less than 55 given history of stroke.            Dispo:  Return for 1 month follow-up after PFO closure.       I spent 38 minutes reviewing all clinical data during and prior to this visit including all relevant imaging studies, laboratories, clinical information from other health systems and prior notes from both Cardiology and other specialties, interviewing the patient, conducting a complete physical examination, and coordinating care in order to formulate a comprehensive and personalized evaluation and treatment plan.   History of Present Illness:    FOCUSED PROBLEM LIST:   CVA >> headache/R arm weakness>>TNK October 2025 Code Stroke CT head No acute abnormality.    CTA head & neck No LVO, MRI No acute intracranial abnormality.  MRV Unremarkable MRV of the head.  2D Echo EF 55 to 60%, normal left atrial size, cannot exclude small PFO Bilateral lower extremity negative TCD bubble study Spencer grade 3 TEE small PFO 30d monitor occasional PACs, PVCs, no atrial  fibrillation ROPE score 8/PASCAL possible Hyperlipidemia Migraines BMI 32  November 2025:  Patient consents to use of AI scribe. The patient is a 33 year old female with above listed medical problems referred for recommendations regarding PFO in the context of a recent stroke.  The patient was in her normal state of health up until mid October when she developed a headache and right arm weakness.  She is normally quite an active person.  She presented to [facility] and her workup is summarized above.  She underwent TNK administration with full resolution of her symptoms.  A TCD was positive for PFO as well and the TEE demonstrated a small PFO.  She was seen by Dr. Court and a 30-day monitor was placed.  The results of this study are still pending.  She is here for further recommendations.  She experiences palpitations 'pretty much all day' and finds it challenging to record each episode as instructed, opting to document only the most noticeable ones.  In the days following the stroke, she experienced tingling in her arms and legs, which lasted for a while but was not as intense as the initial symptoms. She reports worsening shortness of breath since the stroke, feeling easily fatigued, and experiencing light headaches with movement. No chest pain or leg swelling.  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 She does not smoke. She has a history of syncope, described as random fainting episodes, often occurring when she felt hot. She also inquired about the association between PFO and migraines.  Plan: Refer for monitor; follow-up in 1 month.  December 2025:  Patient consents to use of AI scribe. The patient returns for routine follow-up.  Her monitor was reassuring.  Does not look like she saw neurology.  She will be seeing neurology in the next few weeks.  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 She also notes persistent tingling sensations in her arm, though not as severe as during the stroke event. She experiences consistent palpitations but no new stroke-like symptoms beyond the tingling.  She is currently taking aspirin  as a blood thinner but occasionally misses doses due to the number of pills she takes, which sometimes causes nausea.  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     Current Medications: Current Meds  Medication Sig   albuterol  (VENTOLIN  HFA) 108 (90 Base) MCG/ACT inhaler Inhale 2 puffs into the lungs every 6 (six) hours as needed for wheezing or shortness of breath.   ALPRAZolam  (XANAX ) 0.25 MG tablet Take 2 tablets (0.5 mg total) by mouth 2 (two) times daily as needed for anxiety.   aspirin  EC 81 MG tablet Take 1 tablet (81 mg total) by mouth daily. Swallow whole.   atorvastatin  (LIPITOR ) 80 MG tablet Take 1 tablet (80 mg total) by mouth at bedtime.   clonazePAM  (KLONOPIN ) 0.5 MG tablet Take 1 tablet (0.5 mg total) by mouth at bedtime as needed for anxiety.   EPINEPHrine  0.3 mg/0.3 mL IJ SOAJ injection Inject 0.3 mg into the muscle as needed for anaphylaxis.   famotidine  (PEPCID ) 20 MG tablet Take 1 tablet (20 mg total) by mouth 2 (two) times daily as needed for heartburn or indigestion.   hydrocortisone  cream 1 % Apply topically 4 (four) times daily as needed for itching.   hydrOXYzine  (ATARAX ) 25 MG tablet Take 0.5-1 tablets (12.5-25 mg total) by mouth at bedtime as needed for itching.   levocetirizine (XYZAL ) 5 MG tablet Take 1 tablet daily, can increase to 2 tablets twice daily as needed for hives.  This is maximum dose.  Do not take with other antihistamines such as hydroxyzine .   montelukast  (SINGULAIR ) 10 MG tablet Take 1 tablet (10 mg total) by mouth at bedtime.    omalizumab  (XOLAIR ) 300 MG/2  ML prefilled syringe Inject 300 mg into the skin every 28 (twenty-eight) days.   predniSONE  (DELTASONE ) 10 MG tablet Take 10 mg prednisone  to take up to twice daily as needed for hives.   Current Facility-Administered Medications for the 07/22/24 encounter (Office Visit) with Abdulhadi Stopa K, MD  Medication   omalizumab  (XOLAIR ) injection 300 mg     Review of Systems:   Please see the history of present illness.    All other systems reviewed and are negative.     EKGs/Labs/Other Test Reviewed:   EKG: 2025 sinus rhythm  EKG Interpretation Date/Time:    Ventricular Rate:    PR Interval:    QRS Duration:    QT Interval:    QTC Calculation:   R Axis:      Text Interpretation:          CARDIAC STUDIES: Refer to CV Procedures and Imaging Tabs   Risk Assessment/Calculations:          Physical Exam:   VS:  BP 110/74  Pulse 93   Ht 5' 6 (1.676 m)   Wt 204 lb 8 oz (92.8 kg)   SpO2 98%   BMI 33.01 kg/m        Wt Readings from Last 3 Encounters:  07/22/24 204 lb 8 oz (92.8 kg)  06/19/24 202 lb (91.6 kg)  06/12/24 198 lb (89.8 kg)      GENERAL:  No apparent distress, AOx3 HEENT:  No carotid bruits, +2 carotid impulses, no scleral icterus CAR: RRR no murmurs, gallops, rubs, or thrills RES:  Clear to auscultation bilaterally ABD:  Soft, nontender, nondistended, positive bowel sounds x 4 VASC:  +2 radial pulses, +2 carotid pulses NEURO:  CN 2-12 grossly intact; motor and sensory grossly intact PSYCH:  No active depression or anxiety EXT:  No edema, ecchymosis, or cyanosis  Signed, Shakura Cowing K Cristel Rail, MD  07/22/2024 9:57 AM    [facility] [address] [place], [place], [place]  [address] Phone: [phone]; Fax: [phone]   Note:  This document was prepared using Dragon voice recognition software and may include unintentional dictation errors.

## 2024-07-17 ENCOUNTER — Ambulatory Visit: Admitting: Neurosurgery

## 2024-07-21 ENCOUNTER — Ambulatory Visit: Admitting: Internal Medicine

## 2024-07-22 ENCOUNTER — Encounter: Payer: Self-pay | Admitting: Internal Medicine

## 2024-07-22 ENCOUNTER — Ambulatory Visit: Attending: Internal Medicine | Admitting: Internal Medicine

## 2024-07-22 VITALS — BP 110/74 | HR 93 | Ht 66.0 in | Wt 204.5 lb

## 2024-07-22 DIAGNOSIS — Z01812 Encounter for preprocedural laboratory examination: Secondary | ICD-10-CM

## 2024-07-22 DIAGNOSIS — E785 Hyperlipidemia, unspecified: Secondary | ICD-10-CM | POA: Diagnosis not present

## 2024-07-22 DIAGNOSIS — Q2112 Patent foramen ovale: Secondary | ICD-10-CM

## 2024-07-22 DIAGNOSIS — I639 Cerebral infarction, unspecified: Secondary | ICD-10-CM

## 2024-07-22 NOTE — Patient Instructions (Addendum)
 Medication Instructions:  No medication changes were made at this visit. Continue current regimen.   *If you need a refill on your cardiac medications before your next appointment, please call your pharmacy*  Lab Work: To be completed once Rockie Redman calls you: BMP and CBC  If you have labs (blood work) drawn today and your tests are completely normal, you will receive your results only by: MyChart Message (if you have MyChart) OR A paper copy in the mail If you have any lab test that is abnormal or we need to change your treatment, we will call you to review the results.  Testing/Procedures: PFO Closure - Rockie Redman will be calling you to go over this information at a later time.  Follow-Up: At [facility], you and your health needs are our priority.  As part of our continuing mission to provide you with exceptional heart care, our providers are all part of one team.  This team includes your primary Cardiologist (physician) and Advanced Practice Providers or APPs (Physician Assistants and Nurse Practitioners) who all work together to provide you with the care you need, when you need it.  Your next appointment:   Per structural heart team  Provider:   Lurena Red, MD

## 2024-07-23 ENCOUNTER — Other Ambulatory Visit: Payer: Self-pay

## 2024-07-23 DIAGNOSIS — Q2112 Patent foramen ovale: Secondary | ICD-10-CM

## 2024-08-04 ENCOUNTER — Ambulatory Visit (INDEPENDENT_AMBULATORY_CARE_PROVIDER_SITE_OTHER): Admitting: Neurology

## 2024-08-04 ENCOUNTER — Encounter: Payer: Self-pay | Admitting: Neurology

## 2024-08-04 VITALS — BP 121/78 | HR 85 | Ht 66.0 in | Wt 203.0 lb

## 2024-08-04 DIAGNOSIS — Z820 Family history of epilepsy and other diseases of the nervous system: Secondary | ICD-10-CM | POA: Diagnosis not present

## 2024-08-04 DIAGNOSIS — Q2112 Patent foramen ovale: Secondary | ICD-10-CM

## 2024-08-04 DIAGNOSIS — R351 Nocturia: Secondary | ICD-10-CM | POA: Diagnosis not present

## 2024-08-04 DIAGNOSIS — R0683 Snoring: Secondary | ICD-10-CM | POA: Diagnosis not present

## 2024-08-04 DIAGNOSIS — R2990 Unspecified symptoms and signs involving the nervous system: Secondary | ICD-10-CM | POA: Diagnosis not present

## 2024-08-04 DIAGNOSIS — R519 Headache, unspecified: Secondary | ICD-10-CM | POA: Diagnosis not present

## 2024-08-04 DIAGNOSIS — R0681 Apnea, not elsewhere classified: Secondary | ICD-10-CM

## 2024-08-04 DIAGNOSIS — E66811 Obesity, class 1: Secondary | ICD-10-CM

## 2024-08-04 DIAGNOSIS — G4719 Other hypersomnia: Secondary | ICD-10-CM | POA: Diagnosis not present

## 2024-08-04 DIAGNOSIS — G4700 Insomnia, unspecified: Secondary | ICD-10-CM | POA: Diagnosis not present

## 2024-08-04 DIAGNOSIS — Z9189 Other specified personal risk factors, not elsewhere classified: Secondary | ICD-10-CM | POA: Diagnosis not present

## 2024-08-04 NOTE — Progress Notes (Signed)
 Subjective:    Patient ID: Shannon Huerta is a 33 y.o. female.  HPI    True Mar, MD, PhD [facility] [address] [address] [place], [place] [address]  Dear Dr. Rosslyn,  I saw your patient, Shannon Huerta, upon your kind request in my sleep clinic today for initial consultation of her sleep disorder, in particular, concern for underlying obstructive sleep apnea.  The patient is unaccompanied today. She missed an appointment on 07/06/24.  As you know, [name] is a 33 year old female with an underlying complex medical history of stroke, migraine headaches (followed by my colleague, Dr. Margaret), palpitations, hyperlipidemia, anxiety, PFO (status post TEE in November 2025 and pending PFO closure planned for end of January 2026), strokelike symptoms in October 2025, allergy, urticaria, history of angioedema, asthma, eczema, connective tissue disease, and mild obesity, who reports snoring and excessive daytime somnolence.  She has had witnessed breathing pauses while asleep per husband's observations.  Her Epworth sleepiness score is 8 out of 24, fatigue severity score is 59 out of 63.  I reviewed your office note from 06/02/2024.  She has chronic difficulty initiating and maintaining sleep.  She has had worsening anxiety and takes either Xanax  or clonazepam  nearly daily, usually only 1 or the other.  She has a bedtime between 8 PM and 9:30 PM and rise time is around 5 AM.  She lives with her family including husband and 2 young children, ages 49 and 31.  She works in her home [employer].  She does not drink any alcohol, caffeine between 1 and 3 cups of coffee daily, she is a non-smoker.  Her mom has sleep apnea.  Patient has had weight gain in the past few months in the realm of 14 pounds.  She has been on prednisone  for her urticaria. She is no longer on amitriptyline , it did cause her daytime grogginess but helped her fall asleep a little better.  She tried  melatonin in the past but it caused her anxiety to be worse as she had bad dreams.  Her Past Medical History Is Significant For: Past Medical History:  Diagnosis Date   Allergy    Angio-edema    Angioedema 01/05/2020   Anxiety 2012   Asthma    Connective tissue disease    Eczema    Recurrent urticaria 01/05/2020   Stroke (cerebrum) (HCC) 05/23/2024   Urticaria     Her Past Surgical History Is Significant For: Past Surgical History:  Procedure Laterality Date   TRANSESOPHAGEAL ECHOCARDIOGRAM (CATH LAB) N/A 06/12/2024   Procedure: TRANSESOPHAGEAL ECHOCARDIOGRAM;  Surgeon: Loni Soyla LABOR, MD;  Location: [facility] INVASIVE CV LAB;  Service: Cardiovascular;  Laterality: N/A;    Her Family History Is Significant For: Family History  Problem Relation Age of Onset   Stroke Mother    Migraines Mother    Lupus Mother    Asthma Mother    Sleep apnea Mother    Urticaria Father    Lupus Maternal Uncle    Seizures Neg Hx     Her Social History Is Significant For: Social History   Socioeconomic History   Marital status: Married    Spouse name: Not on file   Number of children: Not on file   Years of education: Not on file   Highest education level: Not on file  Occupational History   Not on file  Tobacco Use   Smoking status: Never    Passive exposure: Current  Smokeless tobacco: Never  Vaping Use   Vaping status: Never Used  Substance and Sexual Activity   Alcohol use: Yes    Comment: SOCIALLY   Drug use: Never   Sexual activity: Yes    Birth control/protection: None  Other Topics Concern   Not on file  Social History Narrative   1 - 2 cups of coffee daily    Social Drivers of Health   Tobacco Use: Medium Risk (08/04/2024)   Patient History    Smoking Tobacco Use: Never    Smokeless Tobacco Use: Never    Passive Exposure: Current  Financial Resource Strain: Not on file  Food Insecurity: No Food Insecurity (05/23/2024)   Epic    Worried About Brewing Technologist in the Last Year: Never true    Ran Out of Food in the Last Year: Never true  Transportation Needs: No Transportation Needs (05/23/2024)   Epic    Lack of Transportation (Medical): No    Lack of Transportation (Non-Medical): No  Physical Activity: Not on file  Stress: Not on file  Social Connections: Not on file  Depression (PHQ2-9): Medium Risk (07/13/2024)   Depression (PHQ2-9)    PHQ-2 Score: 10  Alcohol Screen: Not on file  Housing: Low Risk (05/23/2024)   Epic    Unable to Pay for Housing in the Last Year: No    Number of Times Moved in the Last Year: 0    Homeless in the Last Year: No  Utilities: Not At Risk (05/23/2024)   Epic    Threatened with loss of utilities: No  Health Literacy: Not on file    Her Allergies Are:  Allergies[1]:   Her Current Medications Are:  Outpatient Encounter Medications as of 08/04/2024  Medication Sig   albuterol  (VENTOLIN  HFA) 108 (90 Base) MCG/ACT inhaler Inhale 2 puffs into the lungs every 6 (six) hours as needed for wheezing or shortness of breath.   ALPRAZolam  (XANAX ) 0.25 MG tablet Take 2 tablets (0.5 mg total) by mouth 2 (two) times daily as needed for anxiety.   aspirin  EC 81 MG tablet Take 1 tablet (81 mg total) by mouth daily. Swallow whole.   atorvastatin  (LIPITOR ) 80 MG tablet Take 1 tablet (80 mg total) by mouth at bedtime.   clonazePAM  (KLONOPIN ) 0.5 MG tablet Take 1 tablet (0.5 mg total) by mouth at bedtime as needed for anxiety.   EPINEPHrine  0.3 mg/0.3 mL IJ SOAJ injection Inject 0.3 mg into the muscle as needed for anaphylaxis.   famotidine  (PEPCID ) 20 MG tablet Take 1 tablet (20 mg total) by mouth 2 (two) times daily as needed for heartburn or indigestion.   hydrocortisone  cream 1 % Apply topically 4 (four) times daily as needed for itching.   hydrOXYzine  (ATARAX ) 25 MG tablet Take 0.5-1 tablets (12.5-25 mg total) by mouth at bedtime as needed for itching.   levocetirizine (XYZAL ) 5 MG tablet Take 1 tablet daily, can  increase to 2 tablets twice daily as needed for hives.  This is maximum dose.  Do not take with other antihistamines such as hydroxyzine .   montelukast  (SINGULAIR ) 10 MG tablet Take 1 tablet (10 mg total) by mouth at bedtime.   omalizumab  (XOLAIR ) 300 MG/2  ML prefilled syringe Inject 300 mg into the skin every 28 (twenty-eight) days.   predniSONE  (DELTASONE ) 10 MG tablet Take 10 mg prednisone  to take up to twice daily as needed for hives.   Facility-Administered Encounter Medications as of 08/04/2024  Medication  omalizumab  (XOLAIR ) injection 300 mg  :   Review of Systems:  Out of a complete 14 point review of systems, all are reviewed and negative with the exception of these symptoms as listed below:  Review of Systems  Objective:  Neurological Exam  Physical Exam Physical Examination:   Vitals:   08/04/24 1256  BP: 121/78  Pulse: 85    General Examination: The patient is a very pleasant 33 y.o. female in no acute distress. She appears well-developed and well-nourished and well groomed.   HEENT: Normocephalic, atraumatic, pupils are equal, round and reactive to light, extraocular tracking is good without limitation to gaze excursion or nystagmus noted. no photophobia.  No Corrective eye glasses in place. Hearing is grossly intact.  Face is symmetric with normal facial animation. Speech is clear without dysarthria. There is no hypophonia. There is no lip, neck/head, jaw or voice tremor. Neck is supple with full range of passive and active motion. There are no carotid bruits on auscultation.  Airway/Oropharynx exam reveals: mild mouth dryness, good dental hygiene and mild airway crowding, due to small airway entry, tonsillar size of about 1-2+, Mallampati class II, neck circumference 14-7/8 inches, minimal to mild overbite noted.  Tongue protrudes centrally and palate elevates symmetrically.  Chest: Clear to auscultation without wheezing, rhonchi or crackles noted.  Heart:  S1+S2+0, regular and normal without murmurs, rubs or gallops noted.   Abdomen: Soft, non-tender and non-distended.  Extremities: There is no obvious swelling in the distal lower extremities bilaterally.   Skin: Warm and dry without trophic changes noted.   Musculoskeletal: exam reveals no obvious joint deformities.   Neurologically:  Mental status: The patient is awake, alert and oriented in all 4 spheres. Her immediate and remote memory, attention, language skills and fund of knowledge are appropriate. There is no evidence of aphasia, agnosia, apraxia or anomia. Speech is clear with normal prosody and enunciation. Thought process is linear. Mood is normal and affect is normal.  Cranial nerves II - XII are as described above under HEENT exam.  Motor exam: Normal bulk, moving all 4 extremities without restriction, no obvious action or resting tremor.  Fine motor skills and coordination: Intact grossly.  Cerebellar testing: No dysmetria or intention tremor. There is no truncal or gait ataxia.  Sensory exam: intact to light touch in the upper and lower extremities.  Gait, station and balance: She stands easily. No veering to one side is noted. No leaning to one side is noted. Posture is age-appropriate and stance is narrow based. Gait shows normal stride length and normal pace. No problems turning are noted.   Assessment and Plan:   In summary, Melady Chow Shiroma is a very pleasant 33 y.o.-year old female with an underlying complex medical history of stroke, migraine headaches (followed by my colleague, Dr. Margaret), palpitations, hyperlipidemia, anxiety, PFO (status post TEE in November 2025 and pending PFO closure planned for end of January 2026), strokelike symptoms in October 2025, allergy, urticaria, history of angioedema, asthma, eczema, connective tissue disease, and mild obesity, whose history and physical exam are concerning for sleep disordered breathing, particularly obstructive sleep apnea  (OSA). A laboratory attended sleep study is typically considered gold standard for evaluation of sleep disordered breathing.   I had a long chat with the patient about my findings and the diagnosis of sleep apnea, particularly OSA, its prognosis and treatment options. We talked about medical/conservative treatments, surgical interventions and non-pharmacological approaches for symptom control. I explained, in particular, the risks and  ramifications of untreated moderate to severe OSA, especially with respect to developing cardiovascular disease down the road, including congestive heart failure (CHF), difficult to treat hypertension, cardiac arrhythmias (particularly A-fib), neurovascular complications including TIA, stroke and dementia. Even type 2 diabetes has, in part, been linked to untreated OSA. Symptoms of untreated OSA may include (but may not be limited to) daytime sleepiness, nocturia (i.e. frequent nighttime urination), memory problems, mood irritability and suboptimally controlled or worsening mood disorder such as depression and/or anxiety, lack of energy, lack of motivation, physical discomfort, as well as recurrent headaches, especially morning or nocturnal headaches. We talked about the importance of maintaining a healthy lifestyle and striving for healthy weight.  I recommended a sleep study at this time. I outlined the differences between a laboratory attended sleep study which is considered more comprehensive and accurate over the option of a home sleep test (HST); the latter may lead to underestimation of sleep disordered breathing in some instances and does not help with diagnosing upper airway resistance syndrome and is not accurate enough to diagnose primary central sleep apnea typically. I outlined possible surgical and non-surgical treatment options of OSA, including the use of a positive airway pressure (PAP) device (i.e. CPAP, AutoPAP/APAP or BiPAP in certain circumstances), a  custom-made dental device (aka oral appliance, which would require a referral to a specialist dentist or orthodontist typically, and is generally speaking not considered for patients with full dentures or edentulous state), upper airway surgical options, such as traditional UPPP (which is not considered a first-line treatment) or the Inspire device (hypoglossal nerve stimulator, which would involve a referral for consultation with an ENT surgeon, after careful selection, following inclusion criteria - also not first-line treatment). I explained the PAP treatment option to the patient in detail, as this is generally considered first-line treatment.  The patient indicated that she would be willing to try PAP therapy, if the need arises. I explained the importance of being compliant with PAP treatment, not only for insurance purposes but primarily to improve patient's symptoms symptoms, and for the patient's long term health benefit, including to reduce Her cardiovascular risks longer-term.    We will pick up our discussion about the next steps and treatment options after testing.  We will keep her posted as to the test results by phone call and/or MyChart messaging where possible.  We will plan to follow-up in sleep clinic accordingly as well.  I answered all her questions today and the patient was in agreement.  She is advised to talk to her primary care about her ongoing anxiety issues and she also endorses some depressive symptoms lately. I encouraged her to call with any interim questions, concerns, problems or updates or email us  through MyChart.  Generally speaking, sleep test authorizations may take up to 2 weeks, sometimes less, sometimes longer, the patient is encouraged to get in touch with us  if they do not hear back from the sleep lab staff directly within the next 2 weeks. This was an extended visit of over 60 minutes with copious electronic record review involved, and considerable counseling and  coordination of care. Thank you very much for allowing me to participate in the care of this nice patient. If I can be of any further assistance to you please do not hesitate to call me at [phone].  Sincerely,   True Mar, MD, PhD     [1] No Known Allergies

## 2024-08-14 ENCOUNTER — Encounter: Payer: Self-pay | Admitting: Neurosurgery

## 2024-08-14 ENCOUNTER — Telehealth: Payer: Self-pay | Admitting: Neurology

## 2024-08-14 ENCOUNTER — Ambulatory Visit: Admitting: Neurosurgery

## 2024-08-14 ENCOUNTER — Other Ambulatory Visit: Admitting: Neurosurgery

## 2024-08-14 VITALS — BP 127/85 | HR 100 | Temp 98.9°F | Ht 66.0 in | Wt 200.0 lb

## 2024-08-14 DIAGNOSIS — G932 Benign intracranial hypertension: Secondary | ICD-10-CM

## 2024-08-14 DIAGNOSIS — R2990 Unspecified symptoms and signs involving the nervous system: Secondary | ICD-10-CM

## 2024-08-14 MED ORDER — ZONISAMIDE 25 MG PO CAPS: 25.0000 mg | ORAL_CAPSULE | Freq: Every day | ORAL | 3 refills | Status: DC

## 2024-08-14 NOTE — Progress Notes (Signed)
 34 year old lady who had been seen at [facility] in the past for evaluation for Chiari malformation and imaging refuted this diagnosis and she was told that she did not have it.  Recently unfortunately she had a stroke and patient was referred to us  for this.  I was able to review the images and confirm with her that she indeed does not have a Chiari.  Given her symptoms, I suspected her to have elevated intracranial pressures and we did a lumbar puncture and an eye evaluation.  A lumbar puncture demonstrated an opening pressure of 22 cm and 15 cc was taken off.  After this unfortunately she had no change in her headaches.  Eye exam demonstrated no papilledema according to her [she did not bring the report with her unfortunately].  The eye doctor apparently told her that her eyes are in excellent health.  She has seen the neurology team for obstructive sleep apnea and is scheduled to have a sleep study and I communicated with the neurology team regarding the scheduling for this.  I hope that they will be able to follow her for her stroke as well.  We do lengthy conversation about her sleep and that this can certainly contribute to headaches and fatigue.  With regards to the elevated CSF pressures, I told her that given the fact that she does not have papilledema and did not notice any improvement after CSF was taken off, 1 could make an argument to not address this.  However the we could also try to medication at a low dose to see if this makes any impact at all.  It is very well possible that her poor sleep is contributing to her headaches as well.  Given the options, she does want to try some medication and I will try her on 25 mg of zonisamide  at night and I will see her back in the middle of February after she has had her PFO closed.

## 2024-08-14 NOTE — Telephone Encounter (Signed)
 Noted, thank you.

## 2024-08-14 NOTE — Progress Notes (Signed)
 Stroke referral placed as directed by Dr Buck.

## 2024-08-14 NOTE — Telephone Encounter (Signed)
 Patient has not been scheduled for sleep study yet, can you look into the status for her authorization, her neurosurgeon (Dr. Rosslyn) was inquiring.

## 2024-08-18 ENCOUNTER — Encounter: Payer: Self-pay | Admitting: Family Medicine

## 2024-08-18 ENCOUNTER — Other Ambulatory Visit: Payer: Self-pay | Admitting: Family Medicine

## 2024-08-18 DIAGNOSIS — L508 Other urticaria: Secondary | ICD-10-CM

## 2024-08-18 NOTE — Telephone Encounter (Signed)
 Please see mychart message sent by pt and advise.

## 2024-08-18 NOTE — Telephone Encounter (Signed)
"   sent mychart  NPSG & [employer] no auth req bc she is not enrolled in prime  "

## 2024-08-18 NOTE — Telephone Encounter (Signed)
 [employer] no auth req via website pt is not enroll in prime   Patient is scheduled at [facility] for 09/18/24 at 9 pm.  Mailed packet and sent mychart

## 2024-08-19 ENCOUNTER — Ambulatory Visit: Attending: Cardiology | Admitting: Cardiovascular Disease

## 2024-08-19 ENCOUNTER — Encounter: Payer: Self-pay | Admitting: Cardiovascular Disease

## 2024-08-19 VITALS — BP 120/82 | HR 85 | Ht 66.0 in | Wt 198.6 lb

## 2024-08-19 DIAGNOSIS — Q2112 Patent foramen ovale: Secondary | ICD-10-CM | POA: Diagnosis not present

## 2024-08-19 DIAGNOSIS — E782 Mixed hyperlipidemia: Secondary | ICD-10-CM | POA: Diagnosis not present

## 2024-08-19 DIAGNOSIS — R002 Palpitations: Secondary | ICD-10-CM

## 2024-08-19 MED ORDER — PREDNISONE 10 MG PO TABS: ORAL_TABLET | ORAL | 0 refills | Status: DC

## 2024-08-19 NOTE — Patient Instructions (Signed)
 Medication Instructions:  Your physician recommends that you continue on your current medications as directed. Please refer to the Current Medication list given to you today.  *If you need a refill on your cardiac medications before your next appointment, please call your pharmacy*  Lab Work: Your physician recommends that you return for lab work in: the next week or 2 for FASTING Lipid/liver panel  If you have labs (blood work) drawn today and your tests are completely normal, you will receive your results only by: MyChart Message (if you have MyChart) OR A paper copy in the mail If you have any lab test that is abnormal or we need to change your treatment, we will call you to review the results.  Testing/Procedures: Your physician has requested that you have an echocardiogram. Echocardiography is a painless test that uses sound waves to create images of your heart. It provides your doctor with information about the size and shape of your heart and how well your hearts chambers and valves are working. This procedure takes approximately one hour. There are no restrictions for this procedure. Please do NOT wear cologne, perfume, aftershave, or lotions (deodorant is allowed). Please arrive 15 minutes prior to your appointment time.  Please note: We ask at that you not bring children with you during ultrasound (echo/ vascular) testing. Due to room size and safety concerns, children are not allowed in the ultrasound rooms during exams. Our front office staff cannot provide observation of children in our lobby area while testing is being conducted. An adult accompanying a patient to their appointment will only be allowed in the ultrasound room at the discretion of the ultrasound technician under special circumstances. We apologize for any inconvenience. **To do in April**   Follow-Up: At [facility], you and your health needs are our priority.  As part of our continuing mission to provide  you with exceptional heart care, our providers are all part of one team.  This team includes your primary Cardiologist (physician) and Advanced Practice Providers or APPs (Physician Assistants and Nurse Practitioners) who all work together to provide you with the care you need, when you need it.  Your next appointment:   6 month(s)  Provider:   Dorn Lesches, MD    We recommend signing up for the patient portal called MyChart.  Sign up information is provided on this After Visit Summary.  MyChart is used to connect with patients for Virtual Visits (Telemedicine).  Patients are able to view lab/test results, encounter notes, upcoming appointments, etc.  Non-urgent messages can be sent to your provider as well.   To learn more about what you can do with MyChart, go to [email or web address].   Other Instructions

## 2024-08-19 NOTE — Assessment & Plan Note (Signed)
 History of heart palpitations with event monitor performed 07/07/2024 that showed occasional PACs and PVCs but no other arrhythmias.

## 2024-08-19 NOTE — Assessment & Plan Note (Signed)
 History of PFO by TEE performed by Dr. Loni 06/12/2024 because of stroke.  This showed a small PFO with bidirectional shunt.  Given her stroke 05/23/2024 she was referred to Dr.Thukkani who has evaluated her and plans on performing PFO closure 09/04/2024.  Will get a 2D echo after that to demonstrate closure.

## 2024-08-19 NOTE — Assessment & Plan Note (Signed)
 History of hyperlipidemia on high-dose statin therapy with lipid profile performed 05/24/2024 revealing total cholesterol 198, LDL 132 and HDL of 36.  She was started on her statin drug after that.  We will recheck a lipid liver profile.

## 2024-08-19 NOTE — Progress Notes (Signed)
 "     08/19/2024 Shannon Huerta   June 16, 1991  [id number]  Primary Physician Towana Small, FNP Primary Cardiologist: Dorn JINNY Lesches MD GENI CODY MADEIRA, FSCAI  HPI:  Shannon Huerta is a 34 y.o.  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  I last saw her in the office 05/27/2024.  Her risk factors include untreated hyperlipidemia recently started on statin therapy. Her mother apparently had congenital heart surgery as a young woman the exact nature of which is unclear. She has never had a heart attack. She denies chest pain but does get episodically short of breath. She admits to not being very active. She also has anxiety disorder and reactive airways disease on medications. She had onset of strokelike symptoms on 05/23/2024 and went to emergency room. She was evaluated and treated with TNK early with resolution of her symptoms. Imaging modalities were unrevealing. 2D echo suggested PFO and transcranial Doppler confirmed this.  She did have a TEE performed by Dr. Loni  06/12/2024 that showed a small PFO with by directional shunting by agitated saline.  I referred her to Dr.Thukkani who evaluated her for this and I scheduled her for PFO closure 09/04/2024.  Her only complaint is of fatigue.   Active Medications[1]   Allergies[2]  Social History   Socioeconomic History   Marital status: Married    Spouse name: Not on file   Number of children: Not on file   Years of education: Not on file   Highest education level: Not on file  Occupational History   Not on file  Tobacco Use   Smoking status: Never    Passive exposure: Current   Smokeless tobacco: Never  Vaping Use   Vaping status: Never Used  Substance and Sexual Activity   Alcohol use: Yes    Comment: SOCIALLY   Drug use: Never   Sexual activity: Yes    Birth control/protection:  None  Other Topics Concern   Not on file  Social History Narrative   1 - 2 cups of coffee daily    Social Drivers of Health   Tobacco Use: Medium Risk (08/19/2024)   Patient History    Smoking Tobacco Use: Never    Smokeless Tobacco Use: Never    Passive Exposure: Current  Financial Resource Strain: Not on file  Food Insecurity: No Food Insecurity (05/23/2024)   Epic    Worried About Programme Researcher, Broadcasting/film/video in the Last Year: Never true    Ran Out of Food in the Last Year: Never true  Transportation Needs: No Transportation Needs (05/23/2024)   Epic    Lack of Transportation (Medical): No    Lack of Transportation (Non-Medical): No  Physical Activity: Not on file  Stress: Not on file  Social Connections: Not on file  Intimate Partner Violence: Not At Risk (05/23/2024)   Epic    Fear of Current or Ex-Partner: No    Emotionally Abused: No    Physically Abused: No    Sexually Abused: No  Depression (PHQ2-9): Medium Risk (07/13/2024)   Depression (PHQ2-9)    PHQ-2 Score: 10  Alcohol Screen: Not on file  Housing: Low Risk (05/23/2024)   Epic    Unable to Pay for Housing in the Last Year: No    Number of Times Moved in the Last Year: 0    Homeless in  the Last Year: No  Utilities: Not At Risk (05/23/2024)   Epic    Threatened with loss of utilities: No  Health Literacy: Not on file     Review of Systems: General: negative for chills, fever, night sweats or weight changes.  Cardiovascular: negative for chest pain, dyspnea on exertion, edema, orthopnea, palpitations, paroxysmal nocturnal dyspnea or shortness of breath Dermatological: negative for rash Respiratory: negative for cough or wheezing Urologic: negative for hematuria Abdominal: negative for nausea, vomiting, diarrhea, bright red blood per rectum, melena, or hematemesis Neurologic: negative for visual changes, syncope, or dizziness All other systems reviewed and are otherwise negative except as noted above.    Blood  pressure 120/82, pulse 85, height 5' 6 (1.676 m), weight 198 lb 9.6 oz (90.1 kg), SpO2 95%.  General appearance: alert and no distress Neck: no adenopathy, no carotid bruit, no JVD, supple, symmetrical, trachea midline, and thyroid  not enlarged, symmetric, no tenderness/mass/nodules Lungs: clear to auscultation bilaterally Heart: regular rate and rhythm, S1, S2 normal, no murmur, click, rub or gallop Extremities: extremities normal, atraumatic, no cyanosis or edema Pulses: 2+ and symmetric Skin: Skin color, texture, turgor normal. No rashes or lesions Neurologic: Grossly normal  EKG not performed today       ASSESSMENT AND PLAN:   Heart palpitations History of heart palpitations with event monitor performed 07/07/2024 that showed occasional PACs and PVCs but no other arrhythmias.  PFO (patent foramen ovale) History of PFO by TEE performed by Dr. Loni 06/12/2024 because of stroke.  This showed a small PFO with bidirectional shunt.  Given her stroke 05/23/2024 she was referred to Dr.Thukkani who has evaluated her and plans on performing PFO closure 09/04/2024.  Will get a 2D echo after that to demonstrate closure.  Hyperlipidemia History of hyperlipidemia on high-dose statin therapy with lipid profile performed 05/24/2024 revealing total cholesterol 198, LDL 132 and HDL of 36.  She was started on her statin drug after that.  We will recheck a lipid liver profile.     Dorn DOROTHA Lesches MD FACP,FACC,FAHA, FSCAI 08/19/2024 10:20 AM     [1]  Current Meds  Medication Sig   albuterol  (VENTOLIN  HFA) 108 (90 Base) MCG/ACT inhaler Inhale 2 puffs into the lungs every 6 (six) hours as needed for wheezing or shortness of breath.   ALPRAZolam  (XANAX ) 0.25 MG tablet Take 2 tablets (0.5 mg total) by mouth 2 (two) times daily as needed for anxiety.   aspirin  EC 81 MG tablet Take 1 tablet (81 mg total) by mouth daily. Swallow whole.   atorvastatin  (LIPITOR ) 80 MG tablet Take 1 tablet (80 mg  total) by mouth at bedtime.   clonazePAM  (KLONOPIN ) 0.5 MG tablet Take 1 tablet (0.5 mg total) by mouth at bedtime as needed for anxiety.   EPINEPHrine  0.3 mg/0.3 mL IJ SOAJ injection Inject 0.3 mg into the muscle as needed for anaphylaxis.   famotidine  (PEPCID ) 20 MG tablet Take 1 tablet (20 mg total) by mouth 2 (two) times daily as needed for heartburn or indigestion.   hydrocortisone  cream 1 % Apply topically 4 (four) times daily as needed for itching.   hydrOXYzine  (ATARAX ) 25 MG tablet Take 0.5-1 tablets (12.5-25 mg total) by mouth at bedtime as needed for itching.   levocetirizine (XYZAL ) 5 MG tablet Take 1 tablet daily, can increase to 2 tablets twice daily as needed for hives.  This is maximum dose.  Do not take with other antihistamines such as hydroxyzine .   montelukast  (SINGULAIR ) 10 MG tablet  Take 1 tablet (10 mg total) by mouth at bedtime.   omalizumab  (XOLAIR ) 300 MG/2  ML prefilled syringe Inject 300 mg into the skin every 28 (twenty-eight) days.   zonisamide  (ZONEGRAN ) 25 MG capsule Take 1 capsule (25 mg total) by mouth at bedtime.   Current Facility-Administered Medications for the 08/19/24 encounter (Office Visit) with Court Dorn PARAS, MD  Medication   omalizumab  (XOLAIR ) injection 300 mg  [2] No Known Allergies  "

## 2024-08-27 ENCOUNTER — Encounter: Payer: Self-pay | Admitting: Family Medicine

## 2024-08-28 ENCOUNTER — Encounter: Payer: Self-pay | Admitting: Internal Medicine

## 2024-08-31 ENCOUNTER — Ambulatory Visit: Admitting: Physician Assistant

## 2024-09-01 ENCOUNTER — Other Ambulatory Visit: Payer: Self-pay | Admitting: Family Medicine

## 2024-09-01 DIAGNOSIS — R5382 Chronic fatigue, unspecified: Secondary | ICD-10-CM

## 2024-09-02 ENCOUNTER — Telehealth: Payer: Self-pay

## 2024-09-02 NOTE — Telephone Encounter (Signed)
 Per Dr. Wendel, scheduled the patient for follow-up to discuss cancelled PFO closure on 09/07/2024.  Recent discussion with Drs. Wendel Popp, and [facility] are on Dr. Parry 07/22/2024 office note.

## 2024-09-03 NOTE — Progress Notes (Unsigned)
 "  Cardiology Office Note:   Date:  09/03/2024  ID:  Shannon Huerta, DOB 1991-05-13, MRN [id number] PCP:  Towana Small, FNP  [facility] Providers Cardiologist:  Wendel Haws, MD Referring MD: Towana Small, FNP  Chief Complaint/Reason for Referral:  CVA/PFO ASSESSMENT:    1. Cerebrovascular accident (CVA), unspecified mechanism (HCC)   2. PFO (patent foramen ovale)   3. Hyperlipidemia LDL goal <70       PLAN:   In order of problems listed above: CVA: Unclear whether the patient actually sustained a stroke or not.  On my discussions with Dr. Rosemarie he did not believe that she did.  In conjunction with a very small PFO, multidisciplinary opinion is to forego PFO closure at this point in time.  It may be that her presentation was more consistent with a complex migraine. PFO: See discussion above. Hyperlipidemia: Continue atorvastatin  80 mg.  Goal LDL now is less than 55 given history of stroke.            Dispo:  No follow-ups on file.       I spent *** minutes reviewing all clinical data during and prior to this visit including all relevant imaging studies, laboratories, clinical information from other health systems and prior notes from both Cardiology and other specialties, interviewing the patient, conducting a complete physical examination, and coordinating care in order to formulate a comprehensive and personalized evaluation and treatment plan.   History of Present Illness:    FOCUSED PROBLEM LIST:   CVA >> headache/R arm weakness>>TNK October 2025 Code Stroke CT head No acute abnormality.    CTA head & neck No LVO, MRI No acute intracranial abnormality.  MRV Unremarkable MRV of the head.  2D Echo EF 55 to 60%, normal left atrial size, cannot exclude small PFO Bilateral lower extremity negative TCD bubble study Spencer grade 3 TEE small PFO 30d monitor occasional PACs, PVCs, no atrial fibrillation ROPE score 8/PASCAL possible Hyperlipidemia Migraines BMI  32  November 2025:  Patient consents to use of AI scribe. The patient is a 34 year old female with above listed medical problems referred for recommendations regarding PFO in the context of a recent stroke.  The patient was in her normal state of health up until mid October when she developed a headache and right arm weakness.  She is normally quite an active person.  She presented to [facility] and her workup is summarized above.  She underwent TNK administration with full resolution of her symptoms.  A TCD was positive for PFO as well and the TEE demonstrated a small PFO.  She was seen by Dr. Court and a 30-day monitor was placed.  The results of this study are still pending.  She is here for further recommendations.  She experiences palpitations 'pretty much all day' and finds it challenging to record each episode as instructed, opting to document only the most noticeable ones.  In the days following the stroke, she experienced tingling in her arms and legs, which lasted for a while but was not as intense as the initial symptoms. She reports worsening shortness of breath since the stroke, feeling easily fatigued, and experiencing light headaches with movement. No chest pain or leg swelling.  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 She does not smoke. She has a history of syncope, described as random fainting episodes, often occurring when she felt hot. She also inquired about the association between PFO and migraines.  Plan: Refer  for monitor; follow-up in 1 month.  December 2025:  Patient consents to use of AI scribe. The patient returns for routine follow-up.  Her monitor was reassuring.  Does not look like she saw neurology.  She will be seeing neurology in the next few weeks.  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 She also notes persistent tingling sensations in her arm, though not as severe as during the stroke event. She experiences consistent palpitations but no new stroke-like symptoms beyond the tingling.  She is currently taking aspirin  as a blood thinner but occasionally misses doses due to the number of pills she takes, which sometimes causes nausea.  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   Plan:  Refer to PFO closure pending neurology recommendations.  February 2026:  Patient consents to use of AI scribe. In the interim I was able to discuss the case with Dr. Rosemarie of neurology.  He felt like her presentation was more consistent with a non-PFO mechanism and may be a complex migraine especially considering her small PFO.  For this reason PFO closure was deferred.  She is here to discuss further.     Current Medications: No outpatient medications have been marked as taking for the 09/07/24 encounter (Appointment) with Shannon Stith K, MD.   Current Facility-Administered Medications for the 09/07/24 encounter (Appointment) with Shannon Flamenco K, MD  Medication   omalizumab  (XOLAIR ) injection 300 mg     Review of Systems:   Please see the history of present illness.    All other systems reviewed and are negative.     EKGs/Labs/Other Test Reviewed:   EKG: 2025 sinus rhythm  EKG Interpretation Date/Time:    Ventricular Rate:    PR Interval:    QRS Duration:    QT Interval:    QTC Calculation:   R Axis:      Text Interpretation:          CARDIAC STUDIES: Refer to CV Procedures and Imaging Tabs   Risk Assessment/Calculations:          Physical Exam:   VS:  There were no vitals taken for this visit.   No BP recorded.  {Refresh Note OR Click here to enter BP  :1}***   Wt Readings from Last 3 Encounters:  08/19/24 198 lb 9.6 oz (90.1 kg)  08/14/24 200 lb  (90.7 kg)  08/04/24 203 lb (92.1 kg)      GENERAL:  No apparent distress, AOx3 HEENT:  No carotid bruits, +2 carotid impulses, no scleral icterus CAR: RRR no murmurs, gallops, rubs, or thrills RES:  Clear to auscultation bilaterally ABD:  Soft, nontender, nondistended, positive bowel sounds x 4 VASC:  +2 radial pulses, +2 carotid pulses NEURO:  CN 2-12 grossly intact; motor and sensory grossly intact PSYCH:  No active depression or anxiety EXT:  No edema, ecchymosis, or cyanosis  Signed, Shannon Bojarski Huerta Krishawna Stiefel, MD  09/03/2024 7:12 AM    [facility] [address] [place], [place], [place]  [address] Phone: [phone]; Fax: [phone]   Note:  This document was prepared using Dragon voice recognition software and may include unintentional dictation errors. "

## 2024-09-04 ENCOUNTER — Encounter (HOSPITAL_COMMUNITY): Admission: RE | Payer: Self-pay | Source: Home / Self Care

## 2024-09-04 ENCOUNTER — Ambulatory Visit (HOSPITAL_COMMUNITY): Admission: RE | Admit: 2024-09-04 | Source: Home / Self Care | Admitting: Internal Medicine

## 2024-09-07 ENCOUNTER — Ambulatory Visit: Admitting: Internal Medicine

## 2024-09-07 DIAGNOSIS — I639 Cerebral infarction, unspecified: Secondary | ICD-10-CM

## 2024-09-07 DIAGNOSIS — Q2112 Patent foramen ovale: Secondary | ICD-10-CM

## 2024-09-07 DIAGNOSIS — E785 Hyperlipidemia, unspecified: Secondary | ICD-10-CM

## 2024-09-08 ENCOUNTER — Encounter

## 2024-09-09 ENCOUNTER — Encounter: Payer: Self-pay | Admitting: Internal Medicine

## 2024-09-09 ENCOUNTER — Ambulatory Visit: Admitting: Internal Medicine

## 2024-09-09 VITALS — BP 94/68 | HR 106 | Ht 66.0 in | Wt 198.4 lb

## 2024-09-09 DIAGNOSIS — Q2112 Patent foramen ovale: Secondary | ICD-10-CM | POA: Diagnosis not present

## 2024-09-09 DIAGNOSIS — E785 Hyperlipidemia, unspecified: Secondary | ICD-10-CM

## 2024-09-09 DIAGNOSIS — I639 Cerebral infarction, unspecified: Secondary | ICD-10-CM | POA: Diagnosis not present

## 2024-09-09 LAB — LIPID PANEL
Chol/HDL Ratio: 6.6 ratio — ABNORMAL HIGH (ref 0.0–4.4)
Cholesterol, Total: 172 mg/dL (ref 100–199)
HDL: 26 mg/dL — ABNORMAL LOW
LDL Chol Calc (NIH): 94 mg/dL (ref 0–99)
Triglycerides: 310 mg/dL — ABNORMAL HIGH (ref 0–149)
VLDL Cholesterol Cal: 52 mg/dL — ABNORMAL HIGH (ref 5–40)

## 2024-09-09 LAB — HEPATIC FUNCTION PANEL
ALT: 18 [IU]/L (ref 0–32)
AST: 14 [IU]/L (ref 0–40)
Albumin: 4.2 g/dL (ref 3.9–4.9)
Alkaline Phosphatase: 78 [IU]/L (ref 41–116)
Bilirubin Total: 0.4 mg/dL (ref 0.0–1.2)
Bilirubin, Direct: 0.12 mg/dL (ref 0.00–0.40)
Total Protein: 6.8 g/dL (ref 6.0–8.5)

## 2024-09-09 NOTE — Patient Instructions (Signed)
" ° °  Follow-Up: At [facility], you and your health needs are our priority.  As part of our continuing mission to provide you with exceptional heart care, our providers are all part of one team.  This team includes your primary Cardiologist (physician) and Advanced Practice Providers or APPs (Physician Assistants and Nurse Practitioners) who all work together to provide you with the care you need, when you need it.  Your next appointment:   Keep appointment with Dr. Court    We recommend signing up for the patient portal called MyChart.  Sign up information is provided on this After Visit Summary.  MyChart is used to connect with patients for Virtual Visits (Telemedicine).  Patients are able to view lab/test results, encounter notes, upcoming appointments, etc.  Non-urgent messages can be sent to your provider as well.   To learn more about what you can do with MyChart, go to [email or web address].   Other Instructions          "

## 2024-09-10 ENCOUNTER — Ambulatory Visit: Payer: Self-pay | Admitting: Cardiovascular Disease

## 2024-09-10 DIAGNOSIS — E785 Hyperlipidemia, unspecified: Secondary | ICD-10-CM

## 2024-09-10 DIAGNOSIS — E782 Mixed hyperlipidemia: Secondary | ICD-10-CM

## 2024-09-10 NOTE — Addendum Note (Signed)
 Addended by: Rufina Kimery W on: 09/10/2024 02:54 PM   Modules accepted: Orders

## 2024-09-18 ENCOUNTER — Ambulatory Visit: Admitting: Neurosurgery

## 2024-09-18 ENCOUNTER — Encounter

## 2024-09-25 ENCOUNTER — Ambulatory Visit: Admitting: Physician Assistant

## 2024-10-05 ENCOUNTER — Ambulatory Visit: Admitting: Physician Assistant

## 2024-11-17 ENCOUNTER — Ambulatory Visit (HOSPITAL_COMMUNITY)

## 2025-01-13 ENCOUNTER — Institutional Professional Consult (permissible substitution): Admitting: Diagnostic Neuroimaging

## 2025-01-25 ENCOUNTER — Ambulatory Visit: Admitting: Cardiovascular Disease
